# Patient Record
Sex: Female | Born: 2000 | Race: White | Hispanic: No | Marital: Single | Smoking: Never smoker
Health system: Southern US, Community
[De-identification: ages and names within clinical notes are randomized; demographics above are authoritative.]

## PROBLEM LIST (undated history)

## (undated) DIAGNOSIS — T7840XA Allergy, unspecified, initial encounter: Secondary | ICD-10-CM

## (undated) DIAGNOSIS — R519 Headache, unspecified: Secondary | ICD-10-CM

## (undated) DIAGNOSIS — J45909 Unspecified asthma, uncomplicated: Secondary | ICD-10-CM

## (undated) DIAGNOSIS — F419 Anxiety disorder, unspecified: Secondary | ICD-10-CM

## (undated) HISTORY — DX: Unspecified asthma, uncomplicated: J45.909

## (undated) HISTORY — PX: MOUTH SURGERY: SHX715

## (undated) HISTORY — DX: Allergy, unspecified, initial encounter: T78.40XA

## (undated) HISTORY — DX: Anxiety disorder, unspecified: F41.9

---
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
## 2019-03-07 ENCOUNTER — Telehealth: Payer: 59 | Admitting: Physician Assistant

## 2019-03-07 DIAGNOSIS — J019 Acute sinusitis, unspecified: Secondary | ICD-10-CM | POA: Diagnosis not present

## 2019-03-07 MED ORDER — DOXYCYCLINE HYCLATE 100 MG PO TABS: 100.0000 mg | ORAL_TABLET | Freq: Two times a day (BID) | ORAL | 0 refills | Status: DC

## 2019-03-07 NOTE — Progress Notes (Signed)
We are sorry that you are not feeling well.  Here is how we plan to help!  Based on what you have shared with me it looks like you have sinusitis.  Sinusitis is inflammation and infection in the sinus cavities of the head.  Based on your presentation I believe you most likely have Acute Bacterial Sinusitis.  This is an infection caused by bacteria and is treated with antibiotics. I have prescribed Doxycycline 100mg by mouth twice a day for 10 days. You may use an oral decongestant such as Mucinex D or if you have glaucoma or high blood pressure use plain Mucinex. Saline nasal spray help and can safely be used as often as needed for congestion.  If you develop worsening sinus pain, fever or notice severe headache and vision changes, or if symptoms are not better after completion of antibiotic, please schedule an appointment with a health care provider.    Sinus infections are not as easily transmitted as other respiratory infection, however we still recommend that you avoid close contact with loved ones, especially the very [place] and elderly.  Remember to wash your hands thoroughly throughout the day as this is the number one way to prevent the spread of infection!  Home Care:  Only take medications as instructed by your medical team.  Complete the entire course of an antibiotic.  Do not take these medications with alcohol.  A steam or ultrasonic humidifier can help congestion.  You can place a towel over your head and breathe in the steam from hot water coming from a faucet.  Avoid close contacts especially the very [place] and the elderly.  Cover your mouth when you cough or sneeze.  Always remember to wash your hands.  Get Help Right Away If:  You develop worsening fever or sinus pain.  You develop a severe head ache or visual changes.  Your symptoms persist after you have completed your treatment plan.  Make sure you  Understand these instructions.  Will watch your  condition.  Will get help right away if you are not doing well or get worse.  Your e-visit answers were reviewed by a board certified advanced clinical practitioner to complete your personal care plan.  Depending on the condition, your plan could have included both over the counter or prescription medications.  If there is a problem please reply  once you have received a response from your provider.  Your safety is important to us.  If you have drug allergies check your prescription carefully.    You can use MyChart to ask questions about today's visit, request a non-urgent call back, or ask for a work or school excuse for 24 hours related to this e-Visit. If it has been greater than 24 hours you will need to follow up with your provider, or enter a new e-Visit to address those concerns.  You will get an e-mail in the next two days asking about your experience.  I hope that your e-visit has been valuable and will speed your recovery. Thank you for using e-visits.  Greater than 5 minutes, yet less than 10 minutes of time have been spent researching, coordinating, and implementing care for this patient today  

## 2019-03-09 ENCOUNTER — Telehealth: Payer: 59 | Admitting: Family

## 2019-03-09 ENCOUNTER — Encounter: Payer: Self-pay | Admitting: Family Medicine

## 2019-03-09 DIAGNOSIS — J329 Chronic sinusitis, unspecified: Secondary | ICD-10-CM

## 2019-03-09 DIAGNOSIS — H9202 Otalgia, left ear: Secondary | ICD-10-CM

## 2019-03-09 NOTE — Progress Notes (Signed)
Based on what you shared with me, I feel your condition warrants further evaluation and I recommend that you be seen for a face to face office visit.   Given that your symptoms have not improved and have worsen with antibiotics you need to be seen face to face to be evaluated.   NOTE: If you entered your credit card information for this eVisit, you will not be charged. You may see a "hold" on your card for the $35 but that hold will drop off and you will not have a charge processed.  If you are having a true medical emergency please call 911.     For an urgent face to face visit, [facility] has four urgent care centers for your convenience:   . [facility]    [phone]                  Get Driving Directions  [id number] [place], [place] [address] . 10 am to 8 pm Monday-Friday . 12 pm to 8 pm Saturday-Sunday   . [facility] [place]                  Get Driving Directions  [id number] [place], [place], [place] [address] . 8 am to 8 pm Monday-Friday . 9 am to 6 pm Saturday . 11 am to 6 pm Sunday   . [facility] at [place]                  Get Driving Directions   [address] [place], [place] [address] . 8 am to 8 pm Monday-Friday . 8 am to 4 pm Saturday-Sunday    . [facility] at [place]                    Get Driving Directions  [id number]  [address] [place],  [address]  . Monday-Friday, 12 PM to 6 PM    Your e-visit answers were reviewed by a board certified advanced clinical practitioner to complete your personal care plan.  Thank you for using e-Visits.

## 2019-04-12 ENCOUNTER — Other Ambulatory Visit: Payer: Self-pay

## 2019-04-12 DIAGNOSIS — Z20822 Contact with and (suspected) exposure to covid-19: Secondary | ICD-10-CM

## 2019-04-13 LAB — NOVEL CORONAVIRUS, NAA: SARS-CoV-2, NAA: NOT DETECTED

## 2019-05-19 ENCOUNTER — Encounter: Payer: Self-pay | Admitting: Family Medicine

## 2019-05-21 DIAGNOSIS — D689 Coagulation defect, unspecified: Secondary | ICD-10-CM

## 2019-05-21 DIAGNOSIS — I2699 Other pulmonary embolism without acute cor pulmonale: Secondary | ICD-10-CM

## 2019-05-21 HISTORY — DX: Other pulmonary embolism without acute cor pulmonale: I26.99

## 2019-05-21 HISTORY — DX: Coagulation defect, unspecified: D68.9

## 2019-05-24 ENCOUNTER — Other Ambulatory Visit: Payer: Self-pay

## 2019-05-24 ENCOUNTER — Emergency Department: Payer: Self-pay

## 2019-05-24 ENCOUNTER — Encounter (HOSPITAL_COMMUNITY): Payer: 59

## 2019-05-24 ENCOUNTER — Emergency Department (HOSPITAL_COMMUNITY)
Admission: EM | Admit: 2019-05-24 | Discharge: 2019-05-24 | DRG: 177 | Disposition: A | Discharge status: Home / Self Care | Payer: 59 | Attending: Internal Medicine | Admitting: Internal Medicine

## 2019-05-24 DIAGNOSIS — Z8249 Family history of ischemic heart disease and other diseases of the circulatory system: Secondary | ICD-10-CM

## 2019-05-24 DIAGNOSIS — U071 COVID-19: Secondary | ICD-10-CM | POA: Diagnosis present

## 2019-05-24 DIAGNOSIS — Z79899 Other long term (current) drug therapy: Secondary | ICD-10-CM | POA: Diagnosis not present

## 2019-05-24 DIAGNOSIS — I2694 Multiple subsegmental pulmonary emboli without acute cor pulmonale: Secondary | ICD-10-CM | POA: Diagnosis present

## 2019-05-24 DIAGNOSIS — I2699 Other pulmonary embolism without acute cor pulmonale: Secondary | ICD-10-CM | POA: Diagnosis not present

## 2019-05-24 DIAGNOSIS — F419 Anxiety disorder, unspecified: Secondary | ICD-10-CM | POA: Diagnosis present

## 2019-05-24 DIAGNOSIS — D696 Thrombocytopenia, unspecified: Secondary | ICD-10-CM | POA: Diagnosis present

## 2019-05-24 DIAGNOSIS — R079 Chest pain, unspecified: Secondary | ICD-10-CM

## 2019-05-24 DIAGNOSIS — J45909 Unspecified asthma, uncomplicated: Secondary | ICD-10-CM | POA: Diagnosis present

## 2019-05-24 LAB — BASIC METABOLIC PANEL
Anion gap: 10 (ref 5–15)
BUN: 7 mg/dL (ref 6–20)
CO2: 23 mmol/L (ref 22–32)
Calcium: 9.5 mg/dL (ref 8.9–10.3)
Chloride: 108 mmol/L (ref 98–111)
Creatinine, Ser: 0.62 mg/dL (ref 0.44–1.00)
GFR calc Af Amer: >60 mL/min (ref >60)
GFR calc non Af Amer: >60 mL/min (ref >60)
Glucose, Bld: 100 mg/dL — ABNORMAL HIGH (ref 70–99)
Potassium: 3.7 mmol/L (ref 3.5–5.1)
Sodium: 141 mmol/L (ref 135–145)

## 2019-05-24 LAB — CBC WITH DIFFERENTIAL/PLATELET
Abs Immature Granulocytes: 0 10*3/uL (ref 0.00–0.07)
Basophils Absolute: 0 10*3/uL (ref 0.0–0.1)
Basophils Relative: 0 %
Eosinophils Absolute: 0 10*3/uL (ref 0.0–0.5)
Eosinophils Relative: 0 %
HCT: 42.7 % (ref 36.0–46.0)
Hemoglobin: 14.2 g/dL (ref 12.0–15.0)
Immature Granulocytes: 0 %
Lymphocytes Relative: 54 %
Lymphs Abs: 1.2 10*3/uL (ref 0.7–4.0)
MCH: 29 pg (ref 26.0–34.0)
MCHC: 33.3 g/dL (ref 30.0–36.0)
MCV: 87.1 fL (ref 80.0–100.0)
Monocytes Absolute: 0.1 10*3/uL (ref 0.1–1.0)
Monocytes Relative: 2 %
Neutro Abs: 1 10*3/uL — ABNORMAL LOW (ref 1.7–7.7)
Neutrophils Relative %: 44 %
Platelets: 20 10*3/uL — CL (ref 150–400)
RBC: 4.9 MIL/uL (ref 3.87–5.11)
RDW: 12.1 % (ref 11.5–15.5)
WBC: 2.2 10*3/uL — ABNORMAL LOW (ref 4.0–10.5)
nRBC: 0 % (ref 0.0–0.2)

## 2019-05-24 LAB — I-STAT BETA HCG BLOOD, ED (MC, WL, AP ONLY): I-stat hCG, quantitative: <5 m[IU]/mL (ref <5)

## 2019-05-24 LAB — DIC (DISSEMINATED INTRAVASCULAR COAGULATION)PANEL
D-Dimer, Quant: 0.82 ug/mL-FEU — ABNORMAL HIGH (ref 0.00–0.50)
Fibrinogen: 412 mg/dL (ref 210–475)
INR: 1 (ref 0.8–1.2)
Platelets: 245 10*3/uL (ref 150–400)
Prothrombin Time: 12.8 seconds (ref 11.4–15.2)
Smear Review: NONE SEEN
aPTT: 22 seconds — ABNORMAL LOW (ref 24–36)

## 2019-05-24 LAB — HEPATIC FUNCTION PANEL
ALT: 21 U/L (ref 0–44)
AST: 20 U/L (ref 15–41)
Albumin: 3.9 g/dL (ref 3.5–5.0)
Alkaline Phosphatase: 57 U/L (ref 38–126)
Bilirubin, Direct: <0.1 mg/dL (ref 0.0–0.2)
Total Bilirubin: 0.6 mg/dL (ref 0.3–1.2)
Total Protein: 6.7 g/dL (ref 6.5–8.1)

## 2019-05-24 LAB — VITAMIN B12: Vitamin B-12: 239 pg/mL (ref 180–914)

## 2019-05-24 LAB — PLATELET COUNT: Platelets: 245 10*3/uL (ref 150–400)

## 2019-05-24 LAB — POC SARS CORONAVIRUS 2 AG -  ED: SARS Coronavirus 2 Ag: POSITIVE — AB

## 2019-05-24 LAB — IMMATURE PLATELET FRACTION: Immature Platelet Fraction: 2.5 % (ref 1.2–8.6)

## 2019-05-24 MED ORDER — VENLAFAXINE HCL ER 150 MG PO CP24
150.0000 mg | ORAL_CAPSULE | Freq: Every day | ORAL | Status: DC
  Filled 2019-05-24: qty 1

## 2019-05-24 MED ORDER — RIVAROXABAN (XARELTO) EDUCATION KIT FOR DVT/PE PATIENTS
PACK | Freq: Once | Status: AC
  Administered 2019-05-24: 22:00:00
  Filled 2019-05-24: qty 1

## 2019-05-24 MED ORDER — SODIUM CHLORIDE 0.9% FLUSH: 3.0000 mL | INTRAVENOUS | Status: DC | PRN

## 2019-05-24 MED ORDER — BISACODYL 5 MG PO TBEC: 5.0000 mg | DELAYED_RELEASE_TABLET | Freq: Every day | ORAL | Status: DC | PRN

## 2019-05-24 MED ORDER — ALBUTEROL SULFATE HFA 108 (90 BASE) MCG/ACT IN AERS: 1.0000 | INHALATION_SPRAY | Freq: Four times a day (QID) | RESPIRATORY_TRACT | Status: DC | PRN

## 2019-05-24 MED ORDER — RIVAROXABAN 15 MG PO TABS
15.0000 mg | ORAL_TABLET | Freq: Two times a day (BID) | ORAL | Status: DC
  Administered 2019-05-24: 15 mg via ORAL
  Filled 2019-05-24 (×2): qty 1

## 2019-05-24 MED ORDER — HEPARIN BOLUS VIA INFUSION
4200.0000 [IU] | Freq: Once | INTRAVENOUS | Status: DC
  Filled 2019-05-24: qty 4200

## 2019-05-24 MED ORDER — HEPARIN (PORCINE) 25000 UT/250ML-% IV SOLN
1350.0000 [IU]/h | INTRAVENOUS | Status: DC
  Filled 2019-05-24: qty 250

## 2019-05-24 MED ORDER — SODIUM CHLORIDE 0.9 % IV SOLN: 250.0000 mL | INTRAVENOUS | Status: DC | PRN

## 2019-05-24 MED ORDER — ONDANSETRON HCL 4 MG PO TABS: 4.0000 mg | ORAL_TABLET | Freq: Four times a day (QID) | ORAL | Status: DC | PRN

## 2019-05-24 MED ORDER — LORATADINE 10 MG PO TABS: 10.0000 mg | ORAL_TABLET | Freq: Every day | ORAL | Status: DC

## 2019-05-24 MED ORDER — ZOLPIDEM TARTRATE 5 MG PO TABS: 5.0000 mg | ORAL_TABLET | Freq: Every evening | ORAL | Status: DC | PRN

## 2019-05-24 MED ORDER — OXYCODONE HCL 5 MG PO TABS: 5.0000 mg | ORAL_TABLET | ORAL | Status: DC | PRN

## 2019-05-24 MED ORDER — RIVAROXABAN (XARELTO) VTE STARTER PACK (15 & 20 MG): ORAL_TABLET | ORAL | 0 refills | Status: DC

## 2019-05-24 MED ORDER — POLYETHYLENE GLYCOL 3350 17 G PO PACK: 17.0000 g | PACK | Freq: Every day | ORAL | Status: DC | PRN

## 2019-05-24 MED ORDER — DOCUSATE SODIUM 100 MG PO CAPS: 100.0000 mg | ORAL_CAPSULE | Freq: Two times a day (BID) | ORAL | Status: DC

## 2019-05-24 MED ORDER — DEXAMETHASONE 4 MG PO TABS
6.0000 mg | ORAL_TABLET | Freq: Once | ORAL | Status: AC
  Administered 2019-05-24: 6 mg via ORAL
  Filled 2019-05-24: qty 2

## 2019-05-24 MED ORDER — ONDANSETRON HCL 4 MG/2ML IJ SOLN: 4.0000 mg | Freq: Four times a day (QID) | INTRAMUSCULAR | Status: DC | PRN

## 2019-05-24 MED ORDER — DEXAMETHASONE 6 MG PO TABS: 6.0000 mg | ORAL_TABLET | Freq: Every day | ORAL | 0 refills | Status: DC

## 2019-05-24 MED ORDER — LEVONORGESTREL-ETHINYL ESTRAD 0.15-30 MG-MCG PO TABS: 1.0000 | ORAL_TABLET | Freq: Every day | ORAL | Status: DC

## 2019-05-24 MED ORDER — ACETAMINOPHEN 325 MG PO TABS: 650.0000 mg | ORAL_TABLET | Freq: Four times a day (QID) | ORAL | Status: DC | PRN

## 2019-05-24 MED ORDER — SODIUM CHLORIDE 0.9% FLUSH: 3.0000 mL | Freq: Two times a day (BID) | INTRAVENOUS | Status: DC

## 2019-05-24 NOTE — Progress Notes (Signed)
ANTICOAGULATION CONSULT NOTE - Initial Consult  Pharmacy Consult for heparin Indication: pulmonary embolus  No Known Allergies  Patient Measurements: Height: 5\' 7"  (170.2 cm) Weight: 215 lb (97.5 kg) IBW/kg (Calculated) : 61.6 Heparin Dosing Weight: 83.2 kg  Vital Signs: Temp: 98.5 F (36.9 C) (12/04 1238) Temp Source: Oral (12/04 1238) BP: 154/106 (12/04 1238) Pulse Rate: 125 (12/04 1238)  Labs: No results for input(s): HGB, HCT, PLT, APTT, LABPROT, INR, HEPARINUNFRC, HEPRLOWMOCWT, CREATININE, CKTOTAL, CKMB, TROPONINIHS in the last 72 hours.  CrCl cannot be calculated (Patient's most recent lab result is older than the maximum 21 days allowed.).   Medical History: Past Medical History:  Diagnosis Date  . Anxiety   . Asthma     Assessment: 52 yof with hx of asthma now presenting with SOB. Outpatient CT scan showing scattered PE without heart strain, bilateral infiltrates.   No s/sx of bleeding. Plan for heparin start once pregnancy test is negative.   Goal of Therapy:  Heparin level 0.3-0.7 units/ml Monitor platelets by anticoagulation protocol: Yes   Plan:  Give 4200 units bolus x 1 Start heparin infusion at 1350 units/hr Check anti-Xa level in 6 hours and daily while on heparin Continue to monitor H&H and platelets  Antonietta Jewel, PharmD, BCCCP Clinical Pharmacist  Phone: [phone]  Please check AMION for all [facility] phone numbers After 10:00 PM, call [facility] [phone] 05/24/2019,1:16 PM

## 2019-05-24 NOTE — ED Provider Notes (Signed)
[facility] EMERGENCY DEPARTMENT Provider Note   CSN: [id number] Arrival date & time: 05/24/19  1233     History   Chief Complaint Chief Complaint  Patient presents with  . Shortness of Breath    HPI Kayla Hayes is a 18 y.o. female.     The history is provided by the patient.  Shortness of Breath Severity:  Moderate Onset quality:  Gradual Timing:  Constant Progression:  Unchanged Chronicity:  New Context: activity   Relieved by:  Rest Worsened by:  Exertion and deep breathing Associated symptoms: chest pain   Associated symptoms: no abdominal pain, no cough, no ear pain, no fever, no rash, no sore throat, no vomiting and no wheezing   Risk factors: oral contraceptive use   Risk factors: no hx of PE/DVT, no prolonged immobilization and no recent surgery   Risk factors comment:  Hx of asthma   Past Medical History:  Diagnosis Date  . Anxiety   . Asthma     Patient Active Problem List   Diagnosis Date Noted  . Bilateral pulmonary embolism ([place]) 05/24/2019  . Anxiety 11/29/2018  . Unspecified asthma(493.90) 08/08/2012    No past surgical history on file.   OB History   No obstetric history on file.      Home Medications    Prior to Admission medications   Medication Sig Start Date End Date Taking? Authorizing Provider  albuterol (VENTOLIN HFA) 108 (90 Base) MCG/ACT inhaler Inhale 1 puff into the lungs every 6 (six) hours as needed for wheezing or shortness of breath.  03/28/14   [provider]  azithromycin (ZITHROMAX) 250 MG tablet Take 2 pills today. Then one pill days 2-5 09/03/18   Lacy Duverney M, PA-C  benzonatate (TESSALON) 100 MG capsule Take 1 capsule (100 mg total) by mouth 3 (three) times daily as needed for cough. 09/03/18   Waldon Merl, PA-C  cetirizine (ZYRTEC) 10 MG tablet Take 1 tablet (10 mg total) by mouth daily. Use as needed for allergies 10/29/18   Copland, Gay Filler, MD  doxycycline (VIBRA-TABS) 100 MG  tablet Take 1 tablet (100 mg total) by mouth 2 (two) times daily. 03/07/19   Hedges, Dellis Filbert, PA-C  fluticasone (FLONASE) 50 MCG/ACT nasal spray Place 2 sprays into both nostrils daily. 09/03/18   Waldon Merl, PA-C  hydrOXYzine (ATARAX/VISTARIL) 10 MG tablet Take 1 tablet (10 mg total) by mouth 3 (three) times daily as needed. Use for anxiety 05/24/17   Copland, Gay Filler, MD  LILLOW 0.15-30 MG-MCG tablet Take 1 tablet by mouth daily.  04/29/17   [provider]  meloxicam (MOBIC) 15 MG tablet Take 15 mg by mouth daily as needed. 05/15/19   [provider]  venlafaxine XR (EFFEXOR-XR) 75 MG 24 hr capsule Take 2 capsules (150 mg total) by mouth daily. 08/23/18   Copland, Gay Filler, MD    Family History Family History  Problem Relation Age of Onset  . Heart disease Paternal Grandfather     Social History Social History   Tobacco Use  . Smoking status: Never Smoker  . Smokeless tobacco: Never Used  Substance Use Topics  . Alcohol use: No  . Drug use: No     Allergies   Patient has no known allergies.   Review of Systems Review of Systems  Constitutional: Negative for chills and fever.  HENT: Negative for ear pain and sore throat.   Eyes: Negative for pain and visual disturbance.  Respiratory:  Positive for shortness of breath. Negative for cough and wheezing.   Cardiovascular: Positive for chest pain. Negative for palpitations.  Gastrointestinal: Negative for abdominal pain and vomiting.  Genitourinary: Negative for dysuria and hematuria.  Musculoskeletal: Negative for arthralgias and back pain.  Skin: Negative for color change and rash.  Neurological: Negative for seizures and syncope.  All other systems reviewed and are negative.    Physical Exam Updated Vital Signs  ED Triage Vitals [05/24/19 1237]  Enc Vitals Group     BP (!) 154/106     Pulse Rate (!) 120     Resp 16     Temp 98.5 F (36.9 C)     Temp Source Oral     SpO2 100 %     Weight 215  lb (97.5 kg)     Height 5\' 7"  (1.702 m)     Head Circumference      Peak Flow      Pain Score 0     Pain Loc      Pain Edu?      Excl. in [place]?     Physical Exam Vitals signs and nursing note reviewed.  Constitutional:      General: She is not in acute distress.    Appearance: She is well-developed. She is not ill-appearing.  HENT:     Head: Normocephalic and atraumatic.  Eyes:     Conjunctiva/sclera: Conjunctivae normal.  Neck:     Musculoskeletal: Neck supple.  Cardiovascular:     Rate and Rhythm: Regular rhythm. Tachycardia present.     Pulses: Normal pulses.     Heart sounds: Normal heart sounds. No murmur.  Pulmonary:     Effort: Pulmonary effort is normal. No respiratory distress.     Breath sounds: Normal breath sounds. No decreased breath sounds, wheezing, rhonchi or rales.  Abdominal:     Palpations: Abdomen is soft.     Tenderness: There is no abdominal tenderness.  Musculoskeletal: Normal range of motion.     Right lower leg: No edema.     Left lower leg: No edema.  Skin:    General: Skin is warm and dry.     Capillary Refill: Capillary refill takes less than 2 seconds.  Neurological:     Mental Status: She is alert.  Psychiatric:        Mood and Affect: Mood normal.      ED Treatments / Results  Labs (all labs ordered are listed, but only abnormal results are displayed) Labs Reviewed  CBC WITH DIFFERENTIAL/PLATELET - Abnormal; Notable for the following components:      Result Value   WBC 2.2 (*)    Platelets 20 (*)    Neutro Abs 1.0 (*)    All other components within normal limits  BASIC METABOLIC PANEL - Abnormal; Notable for the following components:   Glucose, Bld 100 (*)    All other components within normal limits  POC SARS CORONAVIRUS 2 AG -  ED - Abnormal; Notable for the following components:   SARS Coronavirus 2 Ag POSITIVE (*)    All other components within normal limits  HEPATIC FUNCTION PANEL  HEPARIN LEVEL (UNFRACTIONATED)   IMMATURE PLATELET FRACTION  PLATELET COUNT  DIC (DISSEMINATED INTRAVASCULAR COAGULATION) PANEL  FIBRINOGEN  APTT  PROTIME-INR  D-DIMER, QUANTITATIVE (NOT AT [facility])  I-STAT BETA HCG BLOOD, ED ([facility], [facility], [facility] ONLY)    EKG EKG Interpretation  Date/Time:  Friday May 24 2019 14:12:20 EST Ventricular Rate:  89  PR Interval:    QRS Duration: 97 QT Interval:  336 QTC Calculation: 409 R Axis:   74 Text Interpretation: Sinus rhythm Confirmed by Lennice Sites [phone]) on 05/24/2019 2:16:08 PM   Radiology Ct Outside Films Chest  Result Date: 05/24/2019 This examination belongs to an outside facility and is stored here for comparison purposes only.  Contact the originating outside institution for any associated report or interpretation.   Procedures .Critical Care Performed by: Lennice Sites, DO Authorized by: Lennice Sites, DO   Critical care provider statement:    Critical care time (minutes):  35   Critical care was necessary to treat or prevent imminent or life-threatening deterioration of the following conditions:  Respiratory failure   Critical care was time spent personally by me on the following activities:  Blood draw for specimens, development of treatment plan with patient or surrogate, discussions with primary provider, evaluation of patient's response to treatment, examination of patient, obtaining history from patient or surrogate, ordering and performing treatments and interventions, ordering and review of laboratory studies, ordering and review of radiographic studies, pulse oximetry, re-evaluation of patient's condition and review of old charts   (including critical care time)  Medications Ordered in ED Medications  heparin bolus via infusion 4,200 Units (has no administration in time range)  heparin ADULT infusion 100 units/mL (25000 units/263mL sodium chloride 0.45%) (has no administration in time range)    EMERGENCY DEPARTMENT  US GUIDANCE EXAM Emergency  Ultrasound:  US Guidance for Needle Guidance  INDICATIONS: Difficult vascular access Linear probe used in real-time to visualize location of needle entry through skin.   PERFORMED BY: Myself IMAGES ARCHIVED?: Yes LIMITATIONS: Pain VIEWS USED: Transverse INTERPRETATION: Left arm  Initial Impression / Assessment and Plan / ED Course  I have reviewed the triage vital signs and the nursing notes.  Pertinent labs & imaging results that were available during my care of the patient were reviewed by me and considered in my medical decision making (see chart for details).     Kayla Hayes is a 18 year old female history of asthma who presents to the ED with shortness of breath, outpatient CT scan that showed scattered pulmonary emboli, bilateral infiltrates.  Patient tachycardic in the 120s upon arrival but otherwise normal vitals.  Normal blood pressure.  Patient is on estrogen birth control.  Has had chest pain and shortness of breath with exertion over the last several days.  Worse when she takes a deep breath in.  Has had a negative coronavirus test in the past.  Has never been diagnosed with Covid.  Went to an urgent care several days ago and had an EKG, blood work done.  Had a positive D-dimer and was scheduled for an outpatient CT scan today in the [facility] system.  On chart review it appears that patient has scattered emboli with no right heart strain.  There is bilateral infiltrates as well that I suspect are likely infarctions.  Does not have any fever, infectious symptoms.  Will check basic labs, Covid test.    Covid test is positive.  Patient with a white count of 2.2.  Patient with platelet count of 20.  Therefore IV heparin/anticoagulation has been held.  Talked with Dr. Irene Limbo with hematology and he also recommends holding IV heparin at this time until more lab work can be done to figure out if this is viral suppression versus ITP versus DIC.  Patient overall hemodynamically stable.  Overall  small clot burden.  At this time, more  harm likely to be done if given heparin without further work-up.  Hospitalist to admit the patient and follow-up hematology recommendations.  Hemodynamically stable throughout my care.  This chart was dictated using voice recognition software.  Despite best efforts to proofread,  errors can occur which can change the documentation meaning.   Final Clinical Impressions(s) / ED Diagnoses   Final diagnoses:  Multiple subsegmental pulmonary emboli without acute cor pulmonale ([place])  Thrombocytopenia [facility])    ED Discharge Orders    None       Lennice Sites, DO 05/24/19 1655

## 2019-05-24 NOTE — ED Provider Notes (Signed)
Contacted by Dr. Lorin Mercy of the hospitalist service.  Patient with a history of segmental pulmonary emboli and coronavirus positive was being admitted.  Her initial blood count showed some thrombocytopenia around 20 but the repeat is normal at 245.  Dr. Lorin Mercy has seen patient discussed with hematology and critical care.  All parties feel patient can go home with Xarelto and steroids.  Patient is agreeable to this plan. denies any chest pain or shortness of breath.  Patient has been seen by pharmacy as well as case manager with arrangements for first month of Xarelto arranged. She also needs steroids given her coronavirus positive test.  D/w Dr. Bedelia Person of [facility] who agrees with 6 mg of Decadron x10 days.  Patient on recheck she is feeling well and denies chest pain or shortness of breath.  She is not hypoxic.  Mild tachycardia around 100.  No chest pain or shortness of breath.  She wishes to go home. She has a PCP to follow-up with.  Discussed that she needs to see his PCP next week to continue her anticoagulation.  Return to the ED with difficulty breathing, chest pain, or other concerns.  BP 118/66   Pulse 98   Temp 98.5 F (36.9 C) (Oral)   Resp (!) 23   Ht 5\' 7"  (1.702 m)   Wt 97.5 kg   LMP 05/14/2019   SpO2 99%   BMI 33.67 kg/m     Ezequiel Essex, MD 05/25/19 [phone]

## 2019-05-24 NOTE — Progress Notes (Addendum)
Addendum:  Repeat platelets are 245.  Clearly, this changes the entire clinical scenario.  This was pseudothrombocytopenia.  She does not require hospitalization at this time.  She can be discharged on Xarelto and steroids.  She will need to be discharged on COVID precautions.  Will notify EDP that patient is appropriate for outpatient management.  Patient discussed with PCCM; hematology; and EDP and are in agreement.  Patient and mother are aware of change in plan.  Needs f/u CBC next week.  Admission charge canceled and patient billed as ER Consult, level 5 for the inordinate amount of time the coordination of care required.   Carlyon Shadow, M.D.

## 2019-05-24 NOTE — Progress Notes (Signed)
Hematology short note  Patient's case was discussed with ED physician and Dr. Boris Lown medicine. Patient has not been personally examined or interviewed-caveats.  In general terms patient with newly diagnosed subsegmental low burden PE with pleuritic chest pain but no hypoxia or acute shortness of breath.  Also noted to have pulmonary infiltrates and has been found to be newly Covid 19 positive.  Labs show CBC with leukopenia WBC count 2.2k with ANC of 1k, platelets of 20k and normal hemoglobin. No reported previous history of thrombocytopenia or abnormal blood counts.  Ultrasound of the lower extremities has not been done yet but no symptoms per Dr. Lorin Mercy.  We discussed that with platelets of 20k therapeutic anticoagulation would have a increased risk of bleeding and potential alveolar hemorrhage with her pulmonary infiltrates.  Her [id number] could certainly be a triggering event for pulmonary embolism due to resulting hypercoagulable state from the elevation of multiple clotting factors, endothelial injury and prothrombotic circulating particles.  -Would be reasonable to get some initial work-up including checking platelet count in citrate to rule out pseudothrombocytopenia - immature platelet fraction -which is high could suggest peripheral platelet destruction and be suggestive of infection related immune thrombocytopenia and could potentially benefit from IVIG to improve platelet counts. -DIC panel, fibrinogen level, APTT, PT. -Ultrasound lower extremities to evaluate for DVT and determine clot burden -If patient's COVID-19 infection risk profiling warrants steroids this could potentially help her platelet counts as well if immune mediated. -Once platelet counts are more than 30k could consider starting prophylactic subcu heparin. -Once platelet counts more than 50k could consider starting IV heparin without bolus and keeping levels in the low therapeutic range. -Case discussed with  Dr. Karmen Bongo.  Sullivan Lone MD MS

## 2019-05-24 NOTE — H&P (Signed)
History and Physical    FLORIS Hayes [id number] DOB: 11/14/2000 DOA: 05/24/2019  PCP: Darreld Mclean, MD Consultants:  None Patient coming from: Home; NOK: Mother, Kayla Hayes, [phone]  Chief Complaint: SOB  HPI: Kayla Hayes is a 18 y.o. female with medical history significant of asthma and anxiety presenting with SOB.  She reports that she developed pleuritic chest pain in the left lateral chest wall starting about last Wednesday (9 days ago).  She went to urgent care and had a negative COVID test.  She returned on Friday (1 week ago) and had blood work (she reports having another negative COVID test at that time with blood work).  Her blood work was abnormal and she was arranged to have an outpatient CT today (elevated D-dimer) which showed B scattered pulmonary emboli and so she was sent to the ER.  She reports that her only symptom in persistent left lateral chest wall pain, worse with deep inhalation.  Review of Care Everywhere shows a visit to [facility] on 11/25.  She had atypical chest pain and had EKG; CXR; D-dimer; CBC; CMP; and COVID test ordered.  She was given Mobic 15 mg for chest wall pain.  Lab results are not available other than negative COVID from 11/25.  CTA from today shows scattered pulmonary emboli with an overall small embolic burden; and scattered B lung infiltrates.  ED Course:  Scattered pulmonary emboli and B inlfiltrates found on CT (ordered by urgent care after + D-dimer 4 days ago). CP/SOB for a week, negative COVID test 2 weeks ago.  + COVID test today.  No hypoxia, no severe respiratory symptoms.  Also on OCPs.  Platelets 20 - no heparin.  Hematology consult pending, likely viral suppression vs. ITP.  For now, no anticoagulation.  Could transfuse platelets but will defer to heme.  Will admit to [facility].  Review of Systems: As per HPI; otherwise review of systems reviewed and negative.   Ambulatory Status:  Ambulates without  assistance  Past Medical History:  Diagnosis Date   Anxiety    Asthma     No past surgical history on file.  Social History   Socioeconomic History   Marital status: Single    Spouse name: Not on file   Number of children: Not on file   Years of education: Not on file   Highest education level: Not on file  Occupational History   Not on file  Social Needs   Financial resource strain: Not on file   Food insecurity    Worry: Not on file    Inability: Not on file   Transportation needs    Medical: Not on file    Non-medical: Not on file  Tobacco Use   Smoking status: Never Smoker   Smokeless tobacco: Never Used  Substance and Sexual Activity   Alcohol use: No   Drug use: No   Sexual activity: Not on file  Lifestyle   Physical activity    Days per week: Not on file    Minutes per session: Not on file   Stress: Not on file  Relationships   Social connections    Talks on phone: Not on file    Gets together: Not on file    Attends religious service: Not on file    Active member of club or organization: Not on file    Attends meetings of clubs or organizations: Not on file    Relationship status: Not on file  Intimate partner violence    Fear of current or ex partner: Not on file    Emotionally abused: Not on file    Physically abused: Not on file    Forced sexual activity: Not on file  Other Topics Concern   Not on file  Social History Narrative   Not on file    No Known Allergies  Family History  Problem Relation Age of Onset   Heart disease Paternal Grandfather     Prior to Admission medications   Medication Sig Start Date End Date Taking? Authorizing Provider  albuterol (VENTOLIN HFA) 108 (90 Base) MCG/ACT inhaler  03/28/14   [provider]  azithromycin (ZITHROMAX) 250 MG tablet Take 2 pills today. Then one pill days 2-5 09/03/18   Lacy Duverney M, PA-C  benzonatate (TESSALON) 100 MG capsule Take 1 capsule (100 mg total)  by mouth 3 (three) times daily as needed for cough. 09/03/18   Waldon Merl, PA-C  cetirizine (ZYRTEC) 10 MG tablet Take 1 tablet (10 mg total) by mouth daily. Use as needed for allergies 10/29/18   Copland, Gay Filler, MD  doxycycline (VIBRA-TABS) 100 MG tablet Take 1 tablet (100 mg total) by mouth 2 (two) times daily. 03/07/19   Hedges, Dellis Filbert, PA-C  fluticasone (FLONASE) 50 MCG/ACT nasal spray Place 2 sprays into both nostrils daily. 09/03/18   Waldon Merl, PA-C  hydrOXYzine (ATARAX/VISTARIL) 10 MG tablet Take 1 tablet (10 mg total) by mouth 3 (three) times daily as needed. Use for anxiety 05/24/17   Copland, Gay Filler, MD  LILLOW 0.15-30 MG-MCG tablet TAKE 1 TABLET BY MOUTH ONCE DAILY. START ON THE SUNDAY AFTER YOUR NEXT MENSES 04/29/17   [provider]  venlafaxine XR (EFFEXOR-XR) 75 MG 24 hr capsule Take 2 capsules (150 mg total) by mouth daily. 08/23/18   Copland, Gay Filler, MD    Physical Exam: Vitals:   05/24/19 1515 05/24/19 1530 05/24/19 1545 05/24/19 1600  BP: 130/74 133/83 134/73 129/83  Pulse:      Resp: 20 16 19  (!) 9  Temp:      TempSrc:      SpO2:      Weight:      Height:          General:  Appears calm and comfortable and is NAD  Eyes:  PERRL, EOMI, normal lids, iris  ENT:  grossly normal hearing, lips & tongue, mmm; appropriate dentition  Neck:  no LAD, masses or thyromegaly; no carotid bruits  Cardiovascular:  RRR, no m/r/g. No LE edema.   Respiratory:   CTA bilaterally with no wheezes/rales/rhonchi.  Normal respiratory effort.  Abdomen:  soft, NT, ND, NABS  Back:   normal alignment, no CVAT  Skin:  no rash or induration seen on limited exam  Musculoskeletal:  grossly normal tone BUE/BLE, good ROM, no bony abnormality  Lower extremity:  No LE edema. Negative squeeze or Homan's.   Psychiatric:  Anxious and emotionally labile mood and affect, speech fluent and appropriate, AOx3  Neurologic:  CN 2-12 grossly intact, moves all extremities in  coordinated fashion, sensation intact    Radiological Exams on Admission: Ct Outside Films Chest  Result Date: 05/24/2019 This examination belongs to an outside facility and is stored here for comparison purposes only.  Contact the originating outside institution for any associated report or interpretation.   EKG: Independently reviewed.  NSR with rate 89; no evidence of acute ischemia   Labs on Admission: I have personally reviewed the  available labs and imaging studies at the time of the admission.  Pertinent labs:   Unremarkable CMP WBC 2.2 Platelets 20 HCG negative COVID POSITIVE   Assessment/Plan Principal Problem:   Bilateral pulmonary embolism (HCC) Active Problems:   Asthma   Anxiety   COVID-19 virus infection   Thrombocytopenia (HCC)   B PE -Patient without prior episodes of thromboembolic disease presenting with new B scattered PEs (but significant clot burden, no right heart strain) -This is most likely in the setting of COVID-19-associated hypercoagulability in conjunction with OCPs and obesity -However, it is complicated by thrombocytopenia -Patient discussed with Dr. Irene Limbo -For now, the clot burden does not appear to be extensive and she is not hypoxic on room air; this is reassuring, since the patient is not a candidate for anticoagulation at this time. -Will order LE DVT US, although there is no clinical evidence of this on exam at this time. -This cannot be managed as an outpatient since the heme/onc clinic is not allowed to see COVID + patients at this time, and this is a tenuous situation. -Will admit to [facility], remote consults from hematology. -Will hold OCPs, and these need to be carefully considered in the future (although this is a unique circumstance).  Thrombocytopenia -Most likely etiology is viral bone marrow suppression -However, ITP and DIC are considerations and Dr. Irene Limbo has ordered labs to evaluate for these  conditions -If patient has ITP, there may be a role for IVIg for acute ITP presentation with acute infection.  The IVIg would likely increase the platelets enough to allow for initiation of AC. -Assuming this is viral suppression, steroids for COVID (see below) may increase the platelets. -Another consideration would be to transfuse platelets and monitor for stability; if stable, it may be possible to start low-level AC (heparin) with close ongoing monitoring. -Platelet count also ordered to confirm no clumping.  -Generally the plan will be gingerly starting AC and monitoring over time.    COVID-19 infection -COVID POSITIVE -Overall minimal symptoms but complicated scenario as noted above. -The patient has comorbidities which may increase the risk for ARDS/MODS including:  obesity, asthma -Pertinent labs concerning for COVID include leukopenia/lymphopenia; elevated D-dimer (not >1).  Other inflammatory marker labs are pending. -Chest CT with multifocal opacities which may be c/w COVID vs. Multifocal PNA -Will treat with broad-spectrum antibiotics if procalcitonin >0.1.   --Will admit to [facility] for further evaluation, close monitoring, and treatment -Monitor on telemetry x at least 24 hours -At this time, will attempt to avoid use of aerosolized medications and use HFAs instead -Will check daily labs including BMP with Mag, Phos; LFTs; CBC with differential; CRP; ferritin; fibrinogen; D-dimer -Will order steroids (1 mg/kg divided BID) and Remdesivir (pharmacy consult) given +COVID test, +CXR -If the patient shows clinical deterioration, consider transfer to ICU with PCCM consultation -Will attempt to maintain euvolemia to a net negative fluid status -Will ask the patient to maintain an awake prone position for 16+ hours a day, if possible, with a minimum of 2-3 hours at a time -Patient was seen wearing full PPE including: gown, gloves, head cover, N95, and face shield; donning and doffing was in  compliance with current standards.  Asthma -Continue Zyrtec (formulary substitution for Claritin) and Albuterol  Anxiety -Continue Effexor   DVT prophylaxis:  SCDs Code Status:  Full - confirmed with patient Family Communication: Mother was asked to leave in accordance with current [facility] visitation policies; I then spoke with her by telephone  Disposition Plan:  Home once clinically improved Consults called: Hematology; Pulmonology by telephone Admission status: Admit - It is my clinical opinion that admission to INPATIENT is reasonable and necessary because of the expectation that this patient will require hospital care that crosses at least 2 midnights to treat this condition based on the medical complexity of the problems presented.  Given the aforementioned information, the predictability of an adverse outcome is felt to be significant.  Ok to start steroids/remdesivir if needed; steroids may help her platelets.  Unable to manage as an outpatient so needs admission to progressive giving multiple problems at this time.   Karmen Bongo MD [facility]   How to contact the [facility] Attending or Consulting provider [place] or covering provider during after hours [place], for this patient?  1. Check the care team in [facility] and look for a) attending/consulting [facility] provider listed and b) the [facility] team listed 2. Log into [email or web address] and use [place]'s universal password to access. If you do not have the password, please contact the hospital operator. 3. Locate the [facility] provider you are looking for under [facility] and page to a number that you can be directly reached. 4. If you still have difficulty reaching the provider, please page the [facility] (Director on Call) for the Hospitalists listed on amion for assistance.   05/24/2019, 5:41 PM

## 2019-05-24 NOTE — ED Triage Notes (Signed)
Pt reports was told she has a blood clot in her lung. VSS.

## 2019-05-24 NOTE — Discharge Instructions (Addendum)
Take the blood thinner as prescribed all the steroids.  Call your doctor for an appointment early next week.  Keep yourself quarantined until you are feeling better in terms of your coronavirus.  Return to the ED with chest pain, shortness of breath, persistent vomiting, fever or any other concerns.

## 2019-05-25 ENCOUNTER — Encounter: Payer: Self-pay | Admitting: Family Medicine

## 2019-05-26 ENCOUNTER — Encounter: Payer: Self-pay | Admitting: Family Medicine

## 2019-05-26 LAB — FOLATE RBC
Folate, Hemolysate: 498 ng/mL
Folate, RBC: 1191 ng/mL (ref >498)
Hematocrit: 41.8 % (ref 34.0–46.6)

## 2019-05-27 NOTE — Progress Notes (Signed)
[place] at [facility] [address], [place], [place] [address] [id number] [phone]  Date:  05/29/2019   Name:  Kayla Hayes   DOB:  03-Nov-2000   MRN:  [id number]  PCP:  Darreld Mclean, MD    Chief Complaint: No chief complaint on file.   History of Present Illness:  Kayla Hayes is a 18 y.o. very pleasant female patient who presents with the following:  Virtual visit today for hospital follow-up Patient location is home, provider is at office Patient identity confirmed with 2 factors, she gives consent for virtual visit today The patient, her mother, and myself are present on the call today  I last saw her in the office for a physical in March.  She has generally been in good health, some history of anxiety and depression She went to UC the day prior to thanksgiving with chest and shoulder pain.  She returned 2 days later, at which point they did a D dimer which was positive, leading to her CT chest . Per her report a covid test was negative at that time.    She was seen in the ER on 12/4 following her positive D-dimer and outpatient CT showing scattered pulmonary emboli and bilateral infiltrates. She had a Covid test in the ER which was positive, had neutropenia and thrombocytopenia She was not given heparin due to thrombocytopenia- however on recheck her platelets were actually normal so I suspect this was laboratory error Hospital admission was discussed, but when her platelets came back normal on recheck she was allowed to go home She is taking Xarelto, was given dexamethasone  She is on combination OCP but has no other known blood clots risk factors Never a smoker  Never had a clot in the past, no history of immobility No family history of blood clot  She is still taking steroids for 5-6 more days Her cough is more when she is talking, moving; overall she is still feeling tired, but is gradually getting better  They are not  checking any vitals except for her temperature, she has not had a fever She has a 30-day starter pack of Xarelto on hand  Patient Active Problem List   Diagnosis Date Noted  . Bilateral pulmonary embolism ([place]) 05/24/2019  . COVID-19 virus infection 05/24/2019  . Thrombocytopenia ([place]) 05/24/2019  . Anxiety 11/29/2018  . Asthma 08/08/2012    Past Medical History:  Diagnosis Date  . Anxiety   . Asthma     No past surgical history on file.  Social History   Tobacco Use  . Smoking status: Never Smoker  . Smokeless tobacco: Never Used  Substance Use Topics  . Alcohol use: No  . Drug use: No    Family History  Problem Relation Age of Onset  . Heart disease Paternal Grandfather     No Known Allergies  Medication list has been reviewed and updated.  Current Outpatient Medications on File Prior to Visit  Medication Sig Dispense Refill  . albuterol (VENTOLIN HFA) 108 (90 Base) MCG/ACT inhaler Inhale 1 puff into the lungs every 6 (six) hours as needed for wheezing or shortness of breath.     . Ascorbic Acid (VITAMIN C PO) Take 1 tablet by mouth at bedtime.    . cetirizine (ZYRTEC) 10 MG tablet Take 1 tablet (10 mg total) by mouth daily. Use as needed for allergies (Patient taking differently: Take 10 mg by mouth at bedtime.  Use as needed for allergies) 90 tablet 3  . dexamethasone (DECADRON) 6 MG tablet Take 1 tablet (6 mg total) by mouth daily. 10 tablet 0  . LILLOW 0.15-30 MG-MCG tablet Take 1 tablet by mouth at bedtime.   4  . Rivaroxaban 15 & 20 MG TBPK Follow package directions: Take one 15mg  tablet by mouth twice a day. On day 22, switch to one 20mg  tablet once a day. Take with food. 51 each 0  . venlafaxine XR (EFFEXOR-XR) 75 MG 24 hr capsule Take 2 capsules (150 mg total) by mouth daily. (Patient taking differently: Take 150 mg by mouth at bedtime. ) 180 capsule 3  . VITAMIN D PO Take 1 tablet by mouth at bedtime.     No current facility-administered medications on  file prior to visit.     Review of Systems:  As per HPI- otherwise negative.   Physical Examination: There were no vitals filed for this visit. There were no vitals filed for this visit. There is no height or weight on file to calculate BMI. Ideal Body Weight:    Pt observed via video -she looks well, her normal self.  No cough, wheezing, distress is noted  Assessment and Plan: Bilateral pulmonary embolism ([place]) - Plan: Ambulatory referral to Hematology  COVID-19 virus infection  Generally healthy [place] woman following up from ER visit today She was seen at urgent care twice right around Thanksgiving with some chest discomfort, originally thought to be musculoskeletal but then a D-dimer was positive.  This led to a CT angiogram which showed multiple bilateral pulmonary emboli as well as evidence of pneumonia.  She was seen in the ER at which point a COVID-19 test was positive  She is currently taking steroids and Xarelto  Our main question at this time is how long does she need to continue anticoagulation-we are not sure if she may have a clotting disorder, or if PE were just due to her Covid infection  She will continue Xarelto for now, referred to hematology for evaluation Since she is on Xarelto I feel she may continue her combination birth control pill at this time.  However she may also stop taking it if she feels more comfortable  I advised her to rest, drink plenty of fluids, keep her lungs open with several deep breaths per hour She should remain out of work for at least 2 weeks She will contact me if not getting better in the next several days-if any acute worsening or distress go to the emergency room  Signed Lamar Blinks, MD

## 2019-05-29 ENCOUNTER — Ambulatory Visit (INDEPENDENT_AMBULATORY_CARE_PROVIDER_SITE_OTHER): Payer: 59 | Admitting: Family Medicine

## 2019-05-29 ENCOUNTER — Other Ambulatory Visit: Payer: Self-pay

## 2019-05-29 ENCOUNTER — Encounter: Payer: Self-pay | Admitting: Family Medicine

## 2019-05-29 DIAGNOSIS — I2699 Other pulmonary embolism without acute cor pulmonale: Secondary | ICD-10-CM | POA: Diagnosis not present

## 2019-05-29 DIAGNOSIS — U071 COVID-19: Secondary | ICD-10-CM

## 2019-05-29 MED ORDER — ALBUTEROL SULFATE HFA 108 (90 BASE) MCG/ACT IN AERS: 1.0000 | INHALATION_SPRAY | Freq: Four times a day (QID) | RESPIRATORY_TRACT | 3 refills | Status: DC | PRN

## 2019-05-30 ENCOUNTER — Telehealth: Payer: Self-pay | Admitting: Family

## 2019-05-30 NOTE — Telephone Encounter (Signed)
lmom to inform patient of new patient appt 12/29 at 1 pm

## 2019-06-15 ENCOUNTER — Encounter: Payer: Self-pay | Admitting: Family Medicine

## 2019-06-17 ENCOUNTER — Other Ambulatory Visit: Payer: Self-pay | Admitting: Family

## 2019-06-17 DIAGNOSIS — I2699 Other pulmonary embolism without acute cor pulmonale: Secondary | ICD-10-CM

## 2019-06-17 DIAGNOSIS — D6859 Other primary thrombophilia: Secondary | ICD-10-CM

## 2019-06-18 ENCOUNTER — Telehealth: Payer: Self-pay | Admitting: Family

## 2019-06-18 ENCOUNTER — Other Ambulatory Visit: Payer: Self-pay

## 2019-06-18 ENCOUNTER — Inpatient Hospital Stay: Payer: 59 | Attending: Family | Admitting: Family

## 2019-06-18 ENCOUNTER — Inpatient Hospital Stay: Payer: 59

## 2019-06-18 ENCOUNTER — Ambulatory Visit (HOSPITAL_BASED_OUTPATIENT_CLINIC_OR_DEPARTMENT_OTHER)
Admission: RE | Admit: 2019-06-18 | Discharge: 2019-06-18 | Disposition: A | Discharge status: Home / Self Care | Payer: 59 | Source: Ambulatory Visit | Attending: Family | Admitting: Family

## 2019-06-18 ENCOUNTER — Encounter: Payer: Self-pay | Admitting: Family

## 2019-06-18 VITALS — BP 138/76 | HR 110 | Temp 97.5°F | Resp 18 | Ht 67.0 in | Wt 222.0 lb

## 2019-06-18 DIAGNOSIS — Z8249 Family history of ischemic heart disease and other diseases of the circulatory system: Secondary | ICD-10-CM | POA: Diagnosis not present

## 2019-06-18 DIAGNOSIS — D6859 Other primary thrombophilia: Secondary | ICD-10-CM | POA: Insufficient documentation

## 2019-06-18 DIAGNOSIS — I2693 Single subsegmental pulmonary embolism without acute cor pulmonale: Secondary | ICD-10-CM | POA: Insufficient documentation

## 2019-06-18 DIAGNOSIS — Z7901 Long term (current) use of anticoagulants: Secondary | ICD-10-CM | POA: Diagnosis not present

## 2019-06-18 DIAGNOSIS — U071 COVID-19: Secondary | ICD-10-CM | POA: Diagnosis present

## 2019-06-18 DIAGNOSIS — F419 Anxiety disorder, unspecified: Secondary | ICD-10-CM | POA: Diagnosis not present

## 2019-06-18 DIAGNOSIS — R0781 Pleurodynia: Secondary | ICD-10-CM | POA: Diagnosis not present

## 2019-06-18 DIAGNOSIS — I2699 Other pulmonary embolism without acute cor pulmonale: Secondary | ICD-10-CM

## 2019-06-18 DIAGNOSIS — Z79899 Other long term (current) drug therapy: Secondary | ICD-10-CM | POA: Insufficient documentation

## 2019-06-18 LAB — RETICULOCYTES
Immature Retic Fract: 6.1 % (ref 2.3–15.9)
RBC.: 4.64 MIL/uL (ref 3.87–5.11)
Retic Count, Absolute: 77 10*3/uL (ref 19.0–186.0)
Retic Ct Pct: 1.7 % (ref 0.4–3.1)

## 2019-06-18 LAB — CMP (CANCER CENTER ONLY)
ALT: 141 U/L — ABNORMAL HIGH (ref 0–44)
AST: 30 U/L (ref 15–41)
Albumin: 3.8 g/dL (ref 3.5–5.0)
Alkaline Phosphatase: 53 U/L (ref 38–126)
Anion gap: 10 (ref 5–15)
BUN: 6 mg/dL (ref 6–20)
CO2: 24 mmol/L (ref 22–32)
Calcium: 9 mg/dL (ref 8.9–10.3)
Chloride: 107 mmol/L (ref 98–111)
Creatinine: 0.73 mg/dL (ref 0.44–1.00)
GFR, Est AFR Am: >60 mL/min (ref >60)
GFR, Estimated: >60 mL/min (ref >60)
Glucose, Bld: 179 mg/dL — ABNORMAL HIGH (ref 70–99)
Potassium: 3.3 mmol/L — ABNORMAL LOW (ref 3.5–5.1)
Sodium: 141 mmol/L (ref 135–145)
Total Bilirubin: 0.5 mg/dL (ref 0.3–1.2)
Total Protein: 6.1 g/dL — ABNORMAL LOW (ref 6.5–8.1)

## 2019-06-18 LAB — CBC WITH DIFFERENTIAL (CANCER CENTER ONLY)
Abs Immature Granulocytes: 0.01 10*3/uL (ref 0.00–0.07)
Basophils Absolute: 0 10*3/uL (ref 0.0–0.1)
Basophils Relative: 0 %
Eosinophils Absolute: 0 10*3/uL (ref 0.0–0.5)
Eosinophils Relative: 0 %
HCT: 40.8 % (ref 36.0–46.0)
Hemoglobin: 13.6 g/dL (ref 12.0–15.0)
Immature Granulocytes: 0 %
Lymphocytes Relative: 24 %
Lymphs Abs: 1.2 10*3/uL (ref 0.7–4.0)
MCH: 29.1 pg (ref 26.0–34.0)
MCHC: 33.3 g/dL (ref 30.0–36.0)
MCV: 87.4 fL (ref 80.0–100.0)
Monocytes Absolute: 0.2 10*3/uL (ref 0.1–1.0)
Monocytes Relative: 3 %
Neutro Abs: 3.6 10*3/uL (ref 1.7–7.7)
Neutrophils Relative %: 73 %
Platelet Count: 217 10*3/uL (ref 150–400)
RBC: 4.67 MIL/uL (ref 3.87–5.11)
RDW: 12.8 % (ref 11.5–15.5)
WBC Count: 5 10*3/uL (ref 4.0–10.5)
nRBC: 0 % (ref 0.0–0.2)

## 2019-06-18 LAB — SAVE SMEAR(SSMR), FOR PROVIDER SLIDE REVIEW

## 2019-06-18 LAB — ANTITHROMBIN III: AntiThromb III Func: 117 % (ref 75–120)

## 2019-06-18 MED ORDER — RIVAROXABAN 20 MG PO TABS: 20.0000 mg | ORAL_TABLET | Freq: Every day | ORAL | 1 refills | Status: DC

## 2019-06-18 NOTE — Progress Notes (Signed)
Hematology/Oncology Consultation   Name: Kayla Hayes      MRN: [id number]    Location: Room/bed info not found  Date: 06/18/2019 Time:1:22 PM   REFERRING PHYSICIAN: Lamar Blinks, MD  REASON FOR CONSULT: Bilateral pulmonary emboli    DIAGNOSIS: Bilateral pulmonary emboli   HISTORY OF PRESENT ILLNESS: Ms. Kayla Hayes is a very pleasant 18 yo caucasian female diagnosed with both Covid and bilateral subsegmental pulmonary emboli (by CT angio and positive d-dimer) on 05/24/2019. She has no history of thrombus in the past.  She did not have an [place] of her legs.  So far, she is tolerating Xarelto 20 mg PO daily nicely.  She is still currently on estrogen based birth control which we recommend she stop immediately.   She has never been pregnant or miscarried. She was placed on birth control to regulate her cycle and reduce cramping. She did have a heavy cycle this month after starting anticoagulation.  No family history of thrombus. Maternal grandfather had history of stroke.  Her mother has history of 1, possibly 2, miscarriages.   No fever, chills, n/v, cough, rash, dizziness, SOB, chest pain, palpitations, abdominal pain or changes in bowel or bladder habits.  No swelling, tenderness, numbness or tingling in her extremities.  She has maintained a good appetite and is staying well hydrated. Weight described as stable.  She does not smoke, use recreational drugs or drink alcoholic beverages.  She is currently in college for elementary education.   ROS: All other 10 point review of systems is negative.   PAST MEDICAL HISTORY:   Past Medical History:  Diagnosis Date  . Anxiety   . Asthma     ALLERGIES: No Known Allergies    MEDICATIONS:  Current Outpatient Medications on File Prior to Visit  Medication Sig Dispense Refill  . albuterol (VENTOLIN HFA) 108 (90 Base) MCG/ACT inhaler Inhale 1-2 puffs into the lungs every 6 (six) hours as needed for wheezing or shortness of breath. 18 g 3  .  Ascorbic Acid (VITAMIN C PO) Take 1 tablet by mouth at bedtime.    . cetirizine (ZYRTEC) 10 MG tablet Take 1 tablet (10 mg total) by mouth daily. Use as needed for allergies (Patient taking differently: Take 10 mg by mouth at bedtime. Use as needed for allergies) 90 tablet 3  . dexamethasone (DECADRON) 6 MG tablet Take 1 tablet (6 mg total) by mouth daily. 10 tablet 0  . LILLOW 0.15-30 MG-MCG tablet Take 1 tablet by mouth at bedtime.   4  . Rivaroxaban 15 & 20 MG TBPK Follow package directions: Take one 15mg  tablet by mouth twice a day. On day 22, switch to one 20mg  tablet once a day. Take with food. 51 each 0  . venlafaxine XR (EFFEXOR-XR) 75 MG 24 hr capsule Take 2 capsules (150 mg total) by mouth daily. (Patient taking differently: Take 150 mg by mouth at bedtime. ) 180 capsule 3  . VITAMIN D PO Take 1 tablet by mouth at bedtime.     No current facility-administered medications on file prior to visit.     PAST SURGICAL HISTORY No past surgical history on file.  FAMILY HISTORY: Family History  Problem Relation Age of Onset  . Heart disease Paternal Grandfather     SOCIAL HISTORY:  reports that she has never smoked. She has never used smokeless tobacco. She reports that she does not drink alcohol or use drugs.  PERFORMANCE STATUS: The patient's performance status is 1 - Symptomatic but  completely ambulatory  PHYSICAL EXAM: Most Recent Vital Signs: There were no vitals taken for this visit. There were no vitals taken for this visit.  General Appearance:    Alert, cooperative, no distress, appears stated age  Head:    Normocephalic, without obvious abnormality, atraumatic  Eyes:    PERRL, conjunctiva/corneas clear, EOM's intact, fundi    benign, both eyes        Throat:   Lips, mucosa, and tongue normal; teeth and gums normal  Neck:   Supple, symmetrical, trachea midline, no adenopathy;    thyroid:  no enlargement/tenderness/nodules; no carotid   bruit or JVD  Back:      Symmetric, no curvature, ROM normal, no CVA tenderness  Lungs:     Clear to auscultation bilaterally, respirations unlabored  Chest Wall:    No tenderness or deformity   Heart:    Regular rate and rhythm, S1 and S2 normal, no murmur, rub   or gallop     Abdomen:     Soft, non-tender, bowel sounds active all four quadrants,    no masses, no organomegaly        Extremities:   Extremities normal, atraumatic, no cyanosis or edema  Pulses:   2+ and symmetric all extremities  Skin:   Skin color, texture, turgor normal, no rashes or lesions  Lymph nodes:   Cervical, supraclavicular, and axillary nodes normal  Neurologic:   CNII-XII intact, normal strength, sensation and reflexes    throughout    LABORATORY DATA:  No results found for this or any previous visit (from the past 48 hour(s)).    RADIOGRAPHY: No results found.     PATHOLOGY: None  ASSESSMENT/PLAN: Ms. Kayla Hayes is a very pleasant 18 yo caucasian female diagnosed with both Covid and bilateral subsegmental pulmonary emboli. We did an extensive lab work up to rule out any potential underlying genetic factors that could have contributed to her developing the blood clots. Results are pending.  She has also remained on an estrogen based birth control that we recommend she stop today.  She has a follow-up soon with her gynecologist and will discuss progesterone only birth control options at that time. She will complete 3 months of anticoagulation with Xarelto.  She is headed downstairs now for [place] of her legs to rule out DVT.  We will call her to go over results once we have all her lab work in hand.  We will plan to see her back in another 2 months.   All questions were answered and she is in agreement with the plan. She will contact our office with any questions or concerns. We can certainly see her sooner if needed.   She was discussed with and also seen by Dr. Maylon Peppers and he is in agreement with the aforementioned.   Laverna Peace,  NP     Addendum:  I interviewed and examined the patient.  I reviewed the patient's records extensively as well as NP Marche Hottenstein's note, and agree with the plan as detailed.   In summary, patient was diagnosed with Covid and small bilateral PTE after presenting with new onset shortness of breath and pleuritic chest pain.  She was discharged with Xarelto, which she has completed starter pack.  She is tolerating Xarelto relatively well, except worsening menstrual bleeding since starting Xarelto.  She denies any other symptoms of bleeding, such as hematochezia, melena, or hematuria.  She takes estrogen-based contraceptives to regulate menstrual cramping.  She denies any family history of  thromboembolic disorder.  She does not smoke, drink, or use illicit drugs.  She denies any recent travel, injury, or prolonged immobility.  I reviewed with the patient about the plan for care for PTE.  This last episode of blood clot appeared to be provoked in the setting of COVID.  Given her [place] age, it would be reasonable to rule out some of the more common hypercoagulable conditions, including antiphospholipid syndrome, Factor V Leiden, and prothrombin gene mutation.  As she appears to tolerate Xarelto relatively well, I would recommend continuing Xarelto for a total of 3 months based on the recommendation from [place] of Hematology for patients with PTE related to Covid.  As estrogen-based contraceptives can increase her risk of recurrent PTE, I recommend the patient to discuss with her gynecologist regarding other forms of non-estrogen based therapy.  As she has not had Doppler of lower extremity, I recommend obtaining Doppler to rule out lower extremity DVT.  Finally, I reinforced the importance of preventive strategies such as avoiding hormonal supplement, avoiding cigarette smoking, keeping up-to-date with screening programs for early cancer detection, frequent ambulation for long distance travel and  aggressive DVT prophylaxis in all surgical settings.  Should she need any interruption of the anticoagulation for elective procedures in the future, feel free to contact me regarding peri-operative management.  A total of more than 25 minutes were spent face-to-face with the patient during this encounter and over half of that time was spent on counseling and coordination of care as outlined above.  Tish Men, MD 06/20/2019 12:25 PM

## 2019-06-18 NOTE — Telephone Encounter (Signed)
Called and LMVM for patient regarding appointments added per 12/29 los

## 2019-06-19 LAB — PROTEIN S ACTIVITY: Protein S Activity: 92 % (ref 63–140)

## 2019-06-19 LAB — LACTATE DEHYDROGENASE: LDH: 250 U/L — ABNORMAL HIGH (ref 98–192)

## 2019-06-19 LAB — PROTEIN S, TOTAL: Protein S Ag, Total: 75 % (ref 60–150)

## 2019-06-19 LAB — HOMOCYSTEINE: Homocysteine: 3.6 umol/L (ref 0.0–14.5)

## 2019-06-19 LAB — FERRITIN: Ferritin: 217 ng/mL (ref 11–307)

## 2019-06-19 LAB — ERYTHROPOIETIN: Erythropoietin: 15.5 m[IU]/mL (ref 2.6–18.5)

## 2019-06-19 LAB — PROTEIN C ACTIVITY: Protein C Activity: 116 % (ref 73–180)

## 2019-06-20 ENCOUNTER — Encounter: Payer: Self-pay | Admitting: Family

## 2019-06-20 LAB — CARDIOLIPIN ANTIBODIES, IGG, IGM, IGA
Anticardiolipin IgA: <9 APL U/mL (ref 0–11)
Anticardiolipin IgG: <9 GPL U/mL (ref 0–14)
Anticardiolipin IgM: 31 MPL U/mL — ABNORMAL HIGH (ref 0–12)

## 2019-06-20 LAB — LUPUS ANTICOAGULANT PANEL
DRVVT: 59.3 s — ABNORMAL HIGH (ref 0.0–47.0)
PTT Lupus Anticoagulant: 31 s (ref 0.0–51.9)

## 2019-06-20 LAB — BETA-2-GLYCOPROTEIN I ABS, IGG/M/A
Beta-2 Glyco I IgG: <9 GPI IgG units (ref 0–20)
Beta-2-Glycoprotein I IgA: <9 GPI IgA units (ref 0–25)
Beta-2-Glycoprotein I IgM: <9 GPI IgM units (ref 0–32)

## 2019-06-20 LAB — DRVVT CONFIRM: dRVVT Confirm: 1.4 ratio — ABNORMAL HIGH (ref 0.8–1.2)

## 2019-06-20 LAB — DRVVT MIX: dRVVT Mix: 48.2 s — ABNORMAL HIGH (ref 0.0–40.4)

## 2019-06-24 LAB — PROTEIN C, TOTAL: Protein C, Total: 103 % (ref 60–150)

## 2019-06-24 LAB — PROTHROMBIN GENE MUTATION

## 2019-06-24 LAB — FACTOR 5 LEIDEN

## 2019-06-28 ENCOUNTER — Encounter: Payer: Self-pay | Admitting: Family Medicine

## 2019-07-01 ENCOUNTER — Other Ambulatory Visit: Payer: Self-pay | Admitting: Family

## 2019-07-01 ENCOUNTER — Telehealth: Payer: 59 | Admitting: Physician Assistant

## 2019-07-01 ENCOUNTER — Telehealth: Payer: Self-pay | Admitting: Family

## 2019-07-01 DIAGNOSIS — J329 Chronic sinusitis, unspecified: Secondary | ICD-10-CM | POA: Diagnosis not present

## 2019-07-01 DIAGNOSIS — D6859 Other primary thrombophilia: Secondary | ICD-10-CM

## 2019-07-01 DIAGNOSIS — I2699 Other pulmonary embolism without acute cor pulmonale: Secondary | ICD-10-CM

## 2019-07-01 MED ORDER — DOXYCYCLINE HYCLATE 100 MG PO TABS: 100.0000 mg | ORAL_TABLET | Freq: Two times a day (BID) | ORAL | 0 refills | Status: DC

## 2019-07-01 NOTE — Progress Notes (Signed)
Hi Kayla Hayes,   I am sorry that you are not feeling well. I see you have had a few sinus infections this past year.  If you have another sinus infection in the next few weeks - months, there could be an underlying problem. So please follow-up with Dr. Edilia Bo at that time. You may need to have imaging done or to see a specialist.   Here is how we plan to help!  Based on what you have shared with me it looks like you have sinusitis.  Sinusitis is inflammation and infection in the sinus cavities of the head.  Based on your presentation I believe you most likely have Acute Bacterial Sinusitis.  This is an infection caused by bacteria and is treated with antibiotics. I have prescribed Doxycycline 100mg  by mouth twice a day for 10 days.   You may use an oral decongestant such as Mucinex D or if you have glaucoma or high blood pressure use plain Mucinex. Saline nasal spray help and can safely be used as often as needed for congestion.  If you develop worsening sinus pain, fever or notice severe headache and vision changes, or if symptoms are not better after completion of antibiotic, please schedule an appointment with a health care provider.    Sinus infections are not as easily transmitted as other respiratory infection, however we still recommend that you avoid close contact with loved ones, especially the very [place] and elderly.  Remember to wash your hands thoroughly throughout the day as this is the number one way to prevent the spread of infection!  Home Care:  Only take medications as instructed by your medical team.  Complete the entire course of an antibiotic.  Do not take these medications with alcohol.  A steam or ultrasonic humidifier can help congestion.  You can place a towel over your head and breathe in the steam from hot water coming from a faucet.  Avoid close contacts especially the very [place] and the elderly.  Cover your mouth when you cough or sneeze.  Always remember to wash  your hands.  Get Help Right Away If:  You develop worsening fever or sinus pain.  You develop a severe head ache or visual changes.  Your symptoms persist after you have completed your treatment plan.  Make sure you  Understand these instructions.  Will watch your condition.  Will get help right away if you are not doing well or get worse.  Your e-visit answers were reviewed by a board certified advanced clinical practitioner to complete your personal care plan.  Depending on the condition, your plan could have included both over the counter or prescription medications.  If there is a problem please reply  once you have received a response from your provider.  Your safety is important to [place].  If you have drug allergies check your prescription carefully.    You can use MyChart to ask questions about today's visit, request a non-urgent call back, or ask for a work or school excuse for 24 hours related to this e-Visit. If it has been greater than 24 hours you will need to follow up with your provider, or enter a new e-Visit to address those concerns.  You will get an e-mail in the next two days asking about your experience.  I hope that your e-visit has been valuable and will speed your recovery. Thank you for using e-visits.   Greater than 5 minutes, yet less than 10 minutes of time have been  spent researching, coordinating and implementing care for this patient today.

## 2019-07-01 NOTE — Telephone Encounter (Signed)
I spoke with Kayla Hayes over the phone and went over her recent blood work including her being positive for the Lupus anticoagulant. We will plan to see her back in March for follow-up and repeat lab work. No questions at this time.

## 2019-07-01 NOTE — Addendum Note (Signed)
Addended by: Evelina Dun A on: 07/01/2019 06:03 PM   Modules accepted: Orders

## 2019-07-02 ENCOUNTER — Encounter: Payer: Self-pay | Admitting: Family Medicine

## 2019-08-11 ENCOUNTER — Other Ambulatory Visit: Payer: Self-pay | Admitting: Family Medicine

## 2019-08-11 DIAGNOSIS — F411 Generalized anxiety disorder: Secondary | ICD-10-CM

## 2019-08-17 NOTE — Progress Notes (Signed)
[facility] [address] [place], [place] [address] [phone]  Date:  08/19/2019   Name:  Kayla Hayes   DOB:  [birth year]   MRN:  [id number]  PCP:  Darreld Mclean, MD    Chief Complaint: Annual Exam   History of Present Illness:  Kayla Hayes is a 19 y.o. very pleasant female patient who presents with the following:  Kayla Hayes healthy [place] woman here today for physical exam History of depression/anxiety and asthma I saw her most recently in December, for hospital follow-up In early December she tested positive for COVID-19, she then had a D-dimer and CT angio which showed pulmonary emboli.  She was seen in the ER on 12/4, started on Xarelto and discharged home  She is being cared for by hematology, she did test positive for lupus anticoagulant She was actually seen by hematology earlier today; Impression and Plan: Kayla Hayes is a very pleasant 19 yo caucasian female with bilateral pulmonary emboli. 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 We redrew these lab again today.  She is doing well on Xarelto and will continue her same regimen for 1 full year.  We will repeat a CT angio this week to assess her response to therapy.  We will plan to see her again in 3 months for follow-up.   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  Otherwise it is not painful  She also feels that she may have earwax  buildup again, she gets this regularly.  Her ears are itchy and feel full Patient Active Problem List   Diagnosis Date Noted  . Bilateral pulmonary embolism ([facility]) 05/24/2019  . COVID-19 virus infection 05/24/2019  . Thrombocytopenia ([place]) 05/24/2019  . Anxiety 11/29/2018  . Asthma 08/08/2012    Past Medical History:  Diagnosis Date  . Anxiety   . Asthma     History reviewed. No pertinent surgical history.  Social History   Tobacco Use  . Smoking status: Never Smoker  . Smokeless tobacco: Never Used  Substance Use Topics  . Alcohol use: No  . Drug use: No    Family History  Problem Relation Age of Onset  . Heart disease Paternal Grandfather     No Known Allergies  Medication list has been reviewed and updated.  Current Outpatient Medications on File Prior to Visit  Medication Sig Dispense Refill  . albuterol (VENTOLIN HFA) 108 (90 Base) MCG/ACT inhaler Inhale 1-2 puffs into the lungs every 6 (six) hours as needed for wheezing or shortness of breath. 18 g 3  . Ascorbic Acid (VITAMIN C PO) Take 1 tablet by mouth at bedtime.    . cetirizine (ZYRTEC) 10 MG tablet Take 1 tablet (10 mg total) by mouth daily. Use as needed for allergies (Patient taking differently: Take 10 mg by mouth at bedtime.  Use as needed for allergies) 90 tablet 3  . norethindrone (MICRONOR) 0.35 MG tablet Take 1 tablet by mouth daily.    Marland Kitchen venlafaxine XR (EFFEXOR-XR) 75 MG 24 hr capsule TAKE 2 CAPSULES (150 MG TOTAL) BY MOUTH DAILY. 60 capsule 11  . VITAMIN D PO Take 1 tablet by mouth at bedtime.     No current facility-administered medications on file prior to visit.    Review of Systems:  As per HPI- otherwise negative.  Her ears are stopped up She is using q tips  Physical Examination: Vitals:   08/19/19 1105  BP: 110/80  Pulse: (!) 103  Resp: 16  Temp: (!) 97.1 F (36.2 C)  SpO2: 98%   Vitals:   08/19/19 1105  Weight: 234 lb (106.1 kg)  Height: 5\' 7"  (1.702 m)   Body mass index  is 36.65 kg/m. Ideal Body Weight: Weight in (lb) to have BMI = 25: 159.3  GEN: no acute distress.  Overweight, otherwise appears well HEENT: Atraumatic, Normocephalic.   Bilateral TM wnl, oropharynx normal.  PEERL,EOMI.   Ears and Nose: No external deformity. CV: RRR, No M/G/R. No JVD. No thrill. No extra heart sounds. PULM: CTA B, no wheezes, crackles, rhonchi. No retractions. No resp. distress. No accessory muscle use. ABD: S, NT, ND, +BS. No rebound. No HSM. EXTR: No c/c/e PSYCH: Normally interactive. Conversant.  The palmar aspect of the right long finger, right at the base of the finger overlying the MCP joint, displays a tiny firm mobile nodule, most consistent with a cyst Both ears noted to have impacted cerumen.  Verbal consent obtained, irrigated both ears and removed cerumen with no complications  Patient noted improvement in her hearing and comfort   Assessment and Plan: Physical exam  GAD (generalized anxiety disorder) - Plan: venlafaxine XR (EFFEXOR-XR) 150 MG 24 hr capsule  Immunization due - Plan: Td vaccine greater than or equal to 7yo preservative free IM  Bilateral impacted cerumen  Bilateral pulmonary embolism (HCC)  Here today for physical exam.  She is doing well on her current dose of Effexor, wishes to continue this medication.  Refilled for her today She has been treated and monitored by hematology for recent pulmonary embolism associated with COVID-19 and lupus anticoagulant syndrome.  She is taking Xarelto and plans to continue this for a year Removed earwax today Update tetanus Discussed small cyst in her hand.  Offered to have her seen by hand surgery.  She prefers to wait a while longer and see if this may resolve.  She has had a lot of health care recently with her pulmonary embolism. We will plan to check her lipids at next routine blood draw This visit occurred during the SARS-CoV-2 public health emergency.  Safety protocols were in place, including  screening questions prior to the visit, additional usage of staff PPE, and extensive cleaning of exam room while observing appropriate contact time as indicated for disinfecting solutions.    Signed Lamar Blinks, MD

## 2019-08-19 ENCOUNTER — Ambulatory Visit (INDEPENDENT_AMBULATORY_CARE_PROVIDER_SITE_OTHER): Payer: 59 | Admitting: Family Medicine

## 2019-08-19 ENCOUNTER — Inpatient Hospital Stay: Payer: 59 | Attending: Family

## 2019-08-19 ENCOUNTER — Inpatient Hospital Stay (HOSPITAL_BASED_OUTPATIENT_CLINIC_OR_DEPARTMENT_OTHER): Payer: 59 | Admitting: Family

## 2019-08-19 ENCOUNTER — Encounter: Payer: Self-pay | Admitting: Family

## 2019-08-19 ENCOUNTER — Encounter: Payer: Self-pay | Admitting: Family Medicine

## 2019-08-19 ENCOUNTER — Other Ambulatory Visit: Payer: Self-pay

## 2019-08-19 VITALS — BP 127/83 | HR 111 | Temp 97.5°F | Resp 18 | Ht 67.0 in | Wt 233.0 lb

## 2019-08-19 VITALS — BP 110/80 | HR 103 | Temp 97.1°F | Resp 16 | Ht 67.0 in | Wt 234.0 lb

## 2019-08-19 DIAGNOSIS — Z0000 Encounter for general adult medical examination without abnormal findings: Secondary | ICD-10-CM | POA: Diagnosis not present

## 2019-08-19 DIAGNOSIS — I2699 Other pulmonary embolism without acute cor pulmonale: Secondary | ICD-10-CM

## 2019-08-19 DIAGNOSIS — D6859 Other primary thrombophilia: Secondary | ICD-10-CM | POA: Diagnosis not present

## 2019-08-19 DIAGNOSIS — D6862 Lupus anticoagulant syndrome: Secondary | ICD-10-CM | POA: Diagnosis not present

## 2019-08-19 DIAGNOSIS — Z7901 Long term (current) use of anticoagulants: Secondary | ICD-10-CM | POA: Insufficient documentation

## 2019-08-19 DIAGNOSIS — Z23 Encounter for immunization: Secondary | ICD-10-CM

## 2019-08-19 DIAGNOSIS — F411 Generalized anxiety disorder: Secondary | ICD-10-CM

## 2019-08-19 DIAGNOSIS — H6123 Impacted cerumen, bilateral: Secondary | ICD-10-CM

## 2019-08-19 LAB — CMP (CANCER CENTER ONLY)
ALT: 24 U/L (ref 0–44)
AST: 16 U/L (ref 15–41)
Albumin: 4.4 g/dL (ref 3.5–5.0)
Alkaline Phosphatase: 66 U/L (ref 38–126)
Anion gap: 8 (ref 5–15)
BUN: 8 mg/dL (ref 6–20)
CO2: 28 mmol/L (ref 22–32)
Calcium: 9.6 mg/dL (ref 8.9–10.3)
Chloride: 106 mmol/L (ref 98–111)
Creatinine: 0.75 mg/dL (ref 0.44–1.00)
GFR, Est AFR Am: >60 mL/min (ref >60)
GFR, Estimated: >60 mL/min (ref >60)
Glucose, Bld: 108 mg/dL — ABNORMAL HIGH (ref 70–99)
Potassium: 3.4 mmol/L — ABNORMAL LOW (ref 3.5–5.1)
Sodium: 142 mmol/L (ref 135–145)
Total Bilirubin: 0.4 mg/dL (ref 0.3–1.2)
Total Protein: 6.8 g/dL (ref 6.5–8.1)

## 2019-08-19 LAB — CBC WITH DIFFERENTIAL (CANCER CENTER ONLY)
Abs Immature Granulocytes: 0.02 10*3/uL (ref 0.00–0.07)
Basophils Absolute: 0 10*3/uL (ref 0.0–0.1)
Basophils Relative: 0 %
Eosinophils Absolute: 0 10*3/uL (ref 0.0–0.5)
Eosinophils Relative: 0 %
HCT: 41.4 % (ref 36.0–46.0)
Hemoglobin: 14.1 g/dL (ref 12.0–15.0)
Immature Granulocytes: 0 %
Lymphocytes Relative: 15 %
Lymphs Abs: 1 10*3/uL (ref 0.7–4.0)
MCH: 29.3 pg (ref 26.0–34.0)
MCHC: 34.1 g/dL (ref 30.0–36.0)
MCV: 86.1 fL (ref 80.0–100.0)
Monocytes Absolute: 0.5 10*3/uL (ref 0.1–1.0)
Monocytes Relative: 7 %
Neutro Abs: 5.2 10*3/uL (ref 1.7–7.7)
Neutrophils Relative %: 78 %
Platelet Count: 255 10*3/uL (ref 150–400)
RBC: 4.81 MIL/uL (ref 3.87–5.11)
RDW: 12.3 % (ref 11.5–15.5)
WBC Count: 6.7 10*3/uL (ref 4.0–10.5)
nRBC: 0 % (ref 0.0–0.2)

## 2019-08-19 LAB — D-DIMER, QUANTITATIVE: D-Dimer, Quant: <0.27 ug/mL-FEU (ref 0.00–0.50)

## 2019-08-19 MED ORDER — VENLAFAXINE HCL ER 150 MG PO CP24: 150.0000 mg | ORAL_CAPSULE | Freq: Every day | ORAL | 3 refills | Status: DC

## 2019-08-19 NOTE — Progress Notes (Signed)
Hematology and Oncology Follow Up Visit  ANALIZ ESSARY [id number] 2001/02/07 19 y.o. 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 She is doing well and has no complaints at this time.  She is now on a progesterone based birth control, started almost a month ago.  No episodes of abnormal bleeding. No bruising or petechiae on Xarelto.  D-dimer is now down to < 0.27.  No fever, chills, n/v, cough, rash, dizziness, SOB, chest pain, palpitations, abdominal pain or changes in bowel or bladder habits.  No swelling, tenderness, numbness or tingling in her extremities at this time.  She states that she will occasionally have puffiness in her ankles. This comes and goes. None present on today's exam. Pedal pulses are +3.  She has maintained a good appetite and is staying well hydrated. Her weight is stable.   ECOG Performance Status: 1 - Symptomatic but completely ambulatory  Medications:  Allergies as of 08/19/2019   No Known Allergies     Medication List       Accurate as of August 19, 2019  9:51 AM. If you have any questions, ask your nurse or doctor.        STOP taking these medications   doxycycline 100 MG tablet Commonly known as: VIBRA-TABS Stopped by: Kayla Peace, NP     TAKE these medications   albuterol 108 (90 Base) MCG/ACT inhaler Commonly known as: Ventolin HFA Inhale 1-2 puffs into the lungs every 6 (six) hours as needed for wheezing or shortness of breath.   cetirizine 10 MG tablet Commonly known as: ZYRTEC Take 1 tablet (10 mg total) by mouth daily. Use as needed for allergies What changed: when to take this   norethindrone 0.35 MG tablet Commonly known as: MICRONOR Take 1 tablet by mouth daily.   rivaroxaban 20 MG Tabs tablet Commonly known as: Xarelto Take 1 tablet  (20 mg total) by mouth daily with supper.   venlafaxine XR 75 MG 24 hr capsule Commonly known as: EFFEXOR-XR TAKE 2 CAPSULES (150 MG TOTAL) BY MOUTH DAILY.   VITAMIN C PO Take 1 tablet by mouth at bedtime.   VITAMIN D PO Take 1 tablet by mouth at bedtime.       Allergies: No Known Allergies  Past Medical History, Surgical history, Social history, and Family History were reviewed and updated.  Review of Systems: All other 10 point review of systems is negative.   Physical Exam:  height is 5\' 7"  (1.702 m) and weight is 233 lb (105.7 kg). Her temporal temperature is 97.5 F (36.4 C) (abnormal). Her blood pressure is 127/83 and her pulse is 111 (abnormal). Her respiration is 18 and oxygen saturation is 100%.   Wt Readings from Last 3 Encounters:  08/19/19 233 lb (105.7 kg)  06/18/19 222 lb (100.7 kg) (99 %, Z= 2.23)*  05/24/19 215 lb (97.5 kg) (98 %, Z= 2.15)*   * Growth percentiles are based on CDC (Girls, 2-20 Years) data.    Ocular: Sclerae unicteric, pupils equal, round and reactive to light Ear-nose-throat: Oropharynx clear, dentition fair Lymphatic: No cervical or supraclavicular adenopathy Lungs no rales or rhonchi, good excursion bilaterally Heart regular rate and rhythm, no murmur appreciated Abd soft, nontender, positive bowel sounds, no liver or spleen tip palpated on exam, no fluid wave  MSK no focal  spinal tenderness, no joint edema Neuro: non-focal, well-oriented, appropriate affect Breasts: Deferred   Lab Results  Component Value Date   WBC 6.7 08/19/2019   HGB 14.1 08/19/2019   HCT 41.4 08/19/2019   MCV 86.1 08/19/2019   PLT 255 08/19/2019   Lab Results  Component Value Date   FERRITIN 217 06/18/2019   Lab Results  Component Value Date   RETICCTPCT 1.7 06/18/2019   RBC 4.81 08/19/2019   No results found for: KPAFRELGTCHN, LAMBDASER, KAPLAMBRATIO No results found for: IGGSERUM, IGA, IGMSERUM No results found for: Odetta Pink, SPEI   Chemistry      Component Value Date/Time   NA 142 08/19/2019 0832   K 3.4 (L) 08/19/2019 0832   CL 106 08/19/2019 0832   CO2 28 08/19/2019 0832   BUN 8 08/19/2019 0832   CREATININE 0.75 08/19/2019 0832      Component Value Date/Time   CALCIUM 9.6 08/19/2019 0832   ALKPHOS 66 08/19/2019 0832   AST 16 08/19/2019 0832   ALT 24 08/19/2019 0832   BILITOT 0.4 08/19/2019 0832       Impression and Plan: Kayla Hayes is a very pleasant 19 yo caucasian female with bilateral pulmonary emboli. 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 We redrew these lab again today.  She is doing well on Xarelto and will continue her same regimen for 1 full year.  We will repeat a CT angio this week to assess her response to therapy.  We will plan to see her again in 3 months for follow-up.  She will can contact our office with any questions or concerns. We can certainly see her sooner if needed.    Kayla Peace, NP 3/1/20219:51 AM

## 2019-08-19 NOTE — Patient Instructions (Signed)
Great to see you today- I am glad you are getting better!  We wil update your tetanus vaccine today We can plan to do your cholesterol panel at some point in the future I refilled your effexor- changed to 150 mg so just take 1 capsule daily    Health Maintenance, Female Adopting a healthy lifestyle and getting preventive care are important in promoting health and wellness. Ask your health care provider about:  The right schedule for you to have regular tests and exams.  Things you can do on your own to prevent diseases and keep yourself healthy. What should I know about diet, weight, and exercise? Eat a healthy diet   Eat a diet that includes plenty of vegetables, fruits, low-fat dairy products, and lean protein.  Do not eat a lot of foods that are high in solid fats, added sugars, or sodium. Maintain a healthy weight Body mass index (BMI) is used to identify weight problems. It estimates body fat based on height and weight. Your health care provider can help determine your BMI and help you achieve or maintain a healthy weight. Get regular exercise Get regular exercise. This is one of the most important things you can do for your health. Most adults should:  Exercise for at least 150 minutes each week. The exercise should increase your heart rate and make you sweat (moderate-intensity exercise).  Do strengthening exercises at least twice a week. This is in addition to the moderate-intensity exercise.  Spend less time sitting. Even light physical activity can be beneficial. Watch cholesterol and blood lipids Have your blood tested for lipids and cholesterol at 20 years of age, then have this test every 5 years. Have your cholesterol levels checked more often if:  Your lipid or cholesterol levels are high.  You are older than 20 years of age.  You are at high risk for heart disease. What should I know about cancer screening? Depending on your health history and family history,  you may need to have cancer screening at various ages. This may include screening for:  Breast cancer.  Cervical cancer.  Colorectal cancer.  Skin cancer.  Lung cancer. What should I know about heart disease, diabetes, and high blood pressure? Blood pressure and heart disease  High blood pressure causes heart disease and increases the risk of stroke. This is more likely to develop in people who have high blood pressure readings, are of African descent, or are overweight.  Have your blood pressure checked: ? Every 3-5 years if you are 8-26 years of age. ? Every year if you are 38 years old or older. Diabetes Have regular diabetes screenings. This checks your fasting blood sugar level. Have the screening done:  Once every three years after age 15 if you are at a normal weight and have a low risk for diabetes.  More often and at a younger age if you are overweight or have a high risk for diabetes. What should I know about preventing infection? Hepatitis B If you have a higher risk for hepatitis B, you should be screened for this virus. Talk with your health care provider to find out if you are at risk for hepatitis B infection. Hepatitis C Testing is recommended for:  Everyone born from [birth year] through 1966.  Anyone with known risk factors for hepatitis C. Sexually transmitted infections (STIs)  Get screened for STIs, including gonorrhea and chlamydia, if: ? You are sexually active and are younger than 20 years of age. ? You  are older than 20 years of age and your health care provider tells you that you are at risk for this type of infection. ? Your sexual activity has changed since you were last screened, and you are at increased risk for chlamydia or gonorrhea. Ask your health care provider if you are at risk.  Ask your health care provider about whether you are at high risk for HIV. Your health care provider may recommend a prescription medicine to help prevent HIV infection.  If you choose to take medicine to prevent HIV, you should first get tested for HIV. You should then be tested every 3 months for as long as you are taking the medicine. Pregnancy  If you are about to stop having your period (premenopausal) and you may become pregnant, seek counseling before you get pregnant.  Take 400 to 800 micrograms (mcg) of folic acid every day if you become pregnant.  Ask for birth control (contraception) if you want to prevent pregnancy. Osteoporosis and menopause Osteoporosis is a disease in which the bones lose minerals and strength with aging. This can result in bone fractures. If you are 13 years old or older, or if you are at risk for osteoporosis and fractures, ask your health care provider if you should:  Be screened for bone loss.  Take a calcium or vitamin D supplement to lower your risk of fractures.  Be given hormone replacement therapy (HRT) to treat symptoms of menopause. Follow these instructions at home: Lifestyle  Do not use any products that contain nicotine or tobacco, such as cigarettes, e-cigarettes, and chewing tobacco. If you need help quitting, ask your health care provider.  Do not use street drugs.  Do not share needles.  Ask your health care provider for help if you need support or information about quitting drugs. Alcohol use  Do not drink alcohol if: ? Your health care provider tells you not to drink. ? You are pregnant, may be pregnant, or are planning to become pregnant.  If you drink alcohol: ? Limit how much you use to 0-1 drink a day. ? Limit intake if you are breastfeeding.  Be aware of how much alcohol is in your drink. In the [place] one drink equals one 12 oz bottle of beer (355 mL), one 5 oz glass of wine (148 mL), or one 1 oz glass of hard liquor (44 mL). General instructions  Schedule regular health, dental, and eye exams.  Stay current with your vaccines.  Tell your health care provider if: ? You often feel  depressed. ? You have ever been abused or do not feel safe at home. Summary  Adopting a healthy lifestyle and getting preventive care are important in promoting health and wellness.  Follow your health care provider's instructions about healthy diet, exercising, and getting tested or screened for diseases.  Follow your health care provider's instructions on monitoring your cholesterol and blood pressure. This information is not intended to replace advice given to you by your health care provider. Make sure you discuss any questions you have with your health care provider. Document Revised: 05/30/2018 Document Reviewed: 05/30/2018 Elsevier Patient Education  2020 [employer].

## 2019-08-20 LAB — CARDIOLIPIN ANTIBODIES, IGG, IGM, IGA
Anticardiolipin IgA: <9 APL U/mL (ref 0–11)
Anticardiolipin IgG: <9 GPL U/mL (ref 0–14)
Anticardiolipin IgM: <9 MPL U/mL (ref 0–12)

## 2019-08-21 LAB — LUPUS ANTICOAGULANT PANEL
DRVVT: 73.3 s — ABNORMAL HIGH (ref 0.0–47.0)
PTT Lupus Anticoagulant: 39.3 s (ref 0.0–51.9)

## 2019-08-21 LAB — DRVVT MIX: dRVVT Mix: 56.8 s — ABNORMAL HIGH (ref 0.0–40.4)

## 2019-08-21 LAB — DRVVT CONFIRM: dRVVT Confirm: 1.3 ratio — ABNORMAL HIGH (ref 0.8–1.2)

## 2019-08-26 ENCOUNTER — Encounter: Payer: 59 | Admitting: Family Medicine

## 2019-09-09 ENCOUNTER — Other Ambulatory Visit: Payer: Self-pay | Admitting: *Deleted

## 2019-09-09 DIAGNOSIS — I2699 Other pulmonary embolism without acute cor pulmonale: Secondary | ICD-10-CM

## 2019-09-09 MED ORDER — RIVAROXABAN 20 MG PO TABS: 20.0000 mg | ORAL_TABLET | Freq: Every day | ORAL | 2 refills | Status: DC

## 2019-09-12 ENCOUNTER — Telehealth: Payer: Self-pay | Admitting: *Deleted

## 2019-09-12 ENCOUNTER — Ambulatory Visit (HOSPITAL_BASED_OUTPATIENT_CLINIC_OR_DEPARTMENT_OTHER)
Admission: RE | Admit: 2019-09-12 | Discharge: 2019-09-12 | Disposition: A | Discharge status: Home / Self Care | Payer: 59 | Source: Ambulatory Visit | Attending: Family | Admitting: Family

## 2019-09-12 ENCOUNTER — Encounter (HOSPITAL_BASED_OUTPATIENT_CLINIC_OR_DEPARTMENT_OTHER): Payer: Self-pay

## 2019-09-12 ENCOUNTER — Other Ambulatory Visit: Payer: Self-pay

## 2019-09-12 DIAGNOSIS — I2699 Other pulmonary embolism without acute cor pulmonale: Secondary | ICD-10-CM | POA: Diagnosis not present

## 2019-09-12 DIAGNOSIS — D6859 Other primary thrombophilia: Secondary | ICD-10-CM | POA: Diagnosis present

## 2019-09-12 MED ORDER — IOHEXOL 350 MG/ML SOLN
100.0000 mL | Freq: Once | INTRAVENOUS | Status: AC | PRN
  Administered 2019-09-12: 100 mL via INTRAVENOUS

## 2019-09-12 NOTE — Telephone Encounter (Signed)
Pt notified per order of S. Boaz NP that "Her PE's have resolved!!!!!!!! WOO HOO!!!!!!!! She still needs to stay on Xarelto though!!!!"  Teach back done.   Pt appreciative of call and has no questions or concerns at this time.

## 2019-10-08 ENCOUNTER — Other Ambulatory Visit: Payer: Self-pay | Admitting: Family Medicine

## 2019-10-08 DIAGNOSIS — F411 Generalized anxiety disorder: Secondary | ICD-10-CM

## 2019-10-09 ENCOUNTER — Encounter: Payer: Self-pay | Admitting: Family Medicine

## 2019-10-09 DIAGNOSIS — R112 Nausea with vomiting, unspecified: Secondary | ICD-10-CM

## 2019-10-09 MED ORDER — ONDANSETRON HCL 8 MG PO TABS: 8.0000 mg | ORAL_TABLET | Freq: Three times a day (TID) | ORAL | 0 refills | Status: DC | PRN

## 2019-11-14 ENCOUNTER — Other Ambulatory Visit: Payer: Self-pay | Admitting: Family Medicine

## 2019-11-19 ENCOUNTER — Inpatient Hospital Stay: Payer: 59 | Attending: Family

## 2019-11-19 ENCOUNTER — Inpatient Hospital Stay (HOSPITAL_BASED_OUTPATIENT_CLINIC_OR_DEPARTMENT_OTHER): Payer: 59 | Admitting: Family

## 2019-11-19 ENCOUNTER — Encounter: Payer: Self-pay | Admitting: Family

## 2019-11-19 ENCOUNTER — Other Ambulatory Visit: Payer: Self-pay

## 2019-11-19 VITALS — BP 146/96 | HR 90 | Temp 97.6°F | Resp 18 | Ht 67.0 in | Wt 229.4 lb

## 2019-11-19 DIAGNOSIS — D6859 Other primary thrombophilia: Secondary | ICD-10-CM | POA: Diagnosis not present

## 2019-11-19 DIAGNOSIS — Z86711 Personal history of pulmonary embolism: Secondary | ICD-10-CM | POA: Diagnosis present

## 2019-11-19 DIAGNOSIS — D6862 Lupus anticoagulant syndrome: Secondary | ICD-10-CM | POA: Insufficient documentation

## 2019-11-19 DIAGNOSIS — Z79899 Other long term (current) drug therapy: Secondary | ICD-10-CM | POA: Insufficient documentation

## 2019-11-19 DIAGNOSIS — I2699 Other pulmonary embolism without acute cor pulmonale: Secondary | ICD-10-CM

## 2019-11-19 DIAGNOSIS — Z7901 Long term (current) use of anticoagulants: Secondary | ICD-10-CM | POA: Diagnosis not present

## 2019-11-19 DIAGNOSIS — R760 Raised antibody titer: Secondary | ICD-10-CM

## 2019-11-19 LAB — CMP (CANCER CENTER ONLY)
ALT: 27 U/L (ref 0–44)
AST: 19 U/L (ref 15–41)
Albumin: 4.5 g/dL (ref 3.5–5.0)
Alkaline Phosphatase: 81 U/L (ref 38–126)
Anion gap: 6 (ref 5–15)
BUN: 10 mg/dL (ref 6–20)
CO2: 30 mmol/L (ref 22–32)
Calcium: 9.6 mg/dL (ref 8.9–10.3)
Chloride: 105 mmol/L (ref 98–111)
Creatinine: 0.73 mg/dL (ref 0.44–1.00)
GFR, Est AFR Am: >60 mL/min (ref >60)
GFR, Estimated: >60 mL/min (ref >60)
Glucose, Bld: 87 mg/dL (ref 70–99)
Potassium: 3.9 mmol/L (ref 3.5–5.1)
Sodium: 141 mmol/L (ref 135–145)
Total Bilirubin: 0.4 mg/dL (ref 0.3–1.2)
Total Protein: 6.8 g/dL (ref 6.5–8.1)

## 2019-11-19 LAB — CBC WITH DIFFERENTIAL (CANCER CENTER ONLY)
Abs Immature Granulocytes: 0.03 10*3/uL (ref 0.00–0.07)
Basophils Absolute: 0 10*3/uL (ref 0.0–0.1)
Basophils Relative: 0 %
Eosinophils Absolute: 0 10*3/uL (ref 0.0–0.5)
Eosinophils Relative: 0 %
HCT: 42.9 % (ref 36.0–46.0)
Hemoglobin: 14.4 g/dL (ref 12.0–15.0)
Immature Granulocytes: 1 %
Lymphocytes Relative: 25 %
Lymphs Abs: 1.5 10*3/uL (ref 0.7–4.0)
MCH: 29 pg (ref 26.0–34.0)
MCHC: 33.6 g/dL (ref 30.0–36.0)
MCV: 86.5 fL (ref 80.0–100.0)
Monocytes Absolute: 0.4 10*3/uL (ref 0.1–1.0)
Monocytes Relative: 7 %
Neutro Abs: 4.1 10*3/uL (ref 1.7–7.7)
Neutrophils Relative %: 67 %
Platelet Count: 249 10*3/uL (ref 150–400)
RBC: 4.96 MIL/uL (ref 3.87–5.11)
RDW: 12.2 % (ref 11.5–15.5)
WBC Count: 6 10*3/uL (ref 4.0–10.5)
nRBC: 0 % (ref 0.0–0.2)

## 2019-11-19 LAB — D-DIMER, QUANTITATIVE: D-Dimer, Quant: <0.27 ug/mL-FEU (ref 0.00–0.50)

## 2019-11-19 NOTE — Progress Notes (Signed)
Hematology and Oncology Follow Up Visit  Kayla Hayes [id number] 11-05-2000 19 y.o. 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 She is doing well and has no complaints at this time.  CT angio in March showed resolution of pulmonary emboli. D-dimer today is <0.27.  She is doing well on Xarelto and has had no issues with bleeding. No bruising or petechiae.  She is currently on Micronor for birthcontrol and has not had a cycle for 2 months.  She has had issues with hot flashes and night sweats off and on for 6 months or so.  She has had a rash behind her knees that comes and goes. PCP aware. She will try hydrocortisone cream as needed.  No fever, chills, n/v, cough, dizziness, SOB, chest pain, palpitations, abdominal pain or changes in bowel or bladder habits.  No swelling, tenderness, numbness or tingling in her extremities.  She has maintained a good appetite and is staying well hydrated. Her weight is stable.   ECOG Performance Status: 1 - Symptomatic but completely ambulatory  Medications:  Allergies as of 11/19/2019   No Known Allergies     Medication List       Accurate as of November 19, 2019  9:21 AM. If you have any questions, ask your nurse or doctor.        STOP taking these medications   ondansetron 8 MG tablet Commonly known as: ZOFRAN Stopped by: Laverna Peace, NP     TAKE these medications   albuterol 108 (90 Base) MCG/ACT inhaler Commonly known as: Ventolin HFA Inhale 1-2 puffs into the lungs every 6 (six) hours as needed for wheezing or shortness of breath.   cetirizine 10 MG tablet Commonly known as: ZYRTEC Take 1 tablet (10 mg total) by mouth at bedtime. Use as needed for allergies   norethindrone 0.35 MG tablet Commonly known as: MICRONOR Take 1 tablet by  mouth daily.   rivaroxaban 20 MG Tabs tablet Commonly known as: Xarelto Take 1 tablet (20 mg total) by mouth daily.   venlafaxine XR 150 MG 24 hr capsule Commonly known as: EFFEXOR-XR TAKE 1 CAPSULE BY MOUTH EVERY DAY   VITAMIN C PO Take 1 tablet by mouth at bedtime.   VITAMIN D PO Take 1 tablet by mouth at bedtime.       Allergies: No Known Allergies  Past Medical History, Surgical history, Social history, and Family History were reviewed and updated.  Review of Systems: All other 10 point review of systems is negative.   Physical Exam:  vitals were not taken for this visit.   Wt Readings from Last 3 Encounters:  08/19/19 234 lb (106.1 kg)  08/19/19 233 lb (105.7 kg)  06/18/19 222 lb (100.7 kg) (99 %, Z= 2.23)*   * Growth percentiles are based on CDC (Girls, 2-20 Years) data.    Ocular: Sclerae unicteric, pupils equal, round and reactive to light Ear-nose-throat: Oropharynx clear, dentition fair Lymphatic: No cervical or supraclavicular adenopathy Lungs no rales or rhonchi, good excursion bilaterally Heart regular rate and rhythm, no murmur appreciated Abd soft, nontender, positive bowel sounds, no liver or spleen tip palpated on exam, no fluid wave  MSK no focal spinal tenderness, no joint edema Neuro: non-focal, well-oriented, appropriate affect Breasts: Deferred   Lab Results  Component  Value Date   WBC 6.0 11/19/2019   HGB 14.4 11/19/2019   HCT 42.9 11/19/2019   MCV 86.5 11/19/2019   PLT 249 11/19/2019   Lab Results  Component Value Date   FERRITIN 217 06/18/2019   Lab Results  Component Value Date   RETICCTPCT 1.7 06/18/2019   RBC 4.96 11/19/2019   No results found for: KPAFRELGTCHN, LAMBDASER, KAPLAMBRATIO No results found for: IGGSERUM, IGA, IGMSERUM No results found for: Odetta Pink, SPEI   Chemistry      Component Value Date/Time   NA 142 08/19/2019 0832   K 3.4 (L) 08/19/2019 0832     CL 106 08/19/2019 0832   CO2 28 08/19/2019 0832   BUN 8 08/19/2019 0832   CREATININE 0.75 08/19/2019 0832      Component Value Date/Time   CALCIUM 9.6 08/19/2019 0832   ALKPHOS 66 08/19/2019 0832   AST 16 08/19/2019 0832   ALT 24 08/19/2019 0832   BILITOT 0.4 08/19/2019 0832       Impression and Plan: Kayla Hayes is a very pleasant 19 yo caucasian female with bilateral pulmonary emboli. 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  She is doing well on Xarelto and will continue her same regimen for 1 full year (finishes in December 2021).  We will plan to see her again in 4 months for follow-up.  She will can contact our office with any questions or concerns. We can certainly see her sooner if needed.   Laverna Peace, NP 6/1/20219:21 AM

## 2019-11-20 LAB — CARDIOLIPIN ANTIBODIES, IGG, IGM, IGA
Anticardiolipin IgA: <9 APL U/mL (ref 0–11)
Anticardiolipin IgG: <9 GPL U/mL (ref 0–14)
Anticardiolipin IgM: 9 MPL U/mL (ref 0–12)

## 2019-11-22 LAB — DRVVT MIX: dRVVT Mix: 52.9 s — ABNORMAL HIGH (ref 0.0–40.4)

## 2019-11-22 LAB — LUPUS ANTICOAGULANT PANEL
DRVVT: 71.6 s — ABNORMAL HIGH (ref 0.0–47.0)
PTT Lupus Anticoagulant: 38.2 s (ref 0.0–51.9)

## 2019-11-22 LAB — DRVVT CONFIRM: dRVVT Confirm: 1.5 ratio — ABNORMAL HIGH (ref 0.8–1.2)

## 2019-11-23 ENCOUNTER — Encounter: Payer: Self-pay | Admitting: Family Medicine

## 2019-11-23 DIAGNOSIS — L739 Follicular disorder, unspecified: Secondary | ICD-10-CM

## 2019-11-26 MED ORDER — CEPHALEXIN 500 MG PO CAPS: 500.0000 mg | ORAL_CAPSULE | Freq: Three times a day (TID) | ORAL | 0 refills | Status: DC

## 2019-11-26 NOTE — Addendum Note (Signed)
Addended by: Lamar Blinks C on: 11/26/2019 08:13 AM   Modules accepted: Orders

## 2019-12-16 ENCOUNTER — Other Ambulatory Visit: Payer: Self-pay | Admitting: *Deleted

## 2019-12-16 DIAGNOSIS — I2699 Other pulmonary embolism without acute cor pulmonale: Secondary | ICD-10-CM

## 2019-12-16 MED ORDER — RIVAROXABAN 20 MG PO TABS: 20.0000 mg | ORAL_TABLET | Freq: Every day | ORAL | 2 refills | Status: DC

## 2020-03-19 ENCOUNTER — Other Ambulatory Visit: Payer: Self-pay | Admitting: Hematology & Oncology

## 2020-03-19 DIAGNOSIS — I2699 Other pulmonary embolism without acute cor pulmonale: Secondary | ICD-10-CM

## 2020-03-20 ENCOUNTER — Inpatient Hospital Stay: Payer: 59 | Attending: Family | Admitting: Family

## 2020-03-20 ENCOUNTER — Encounter: Payer: Self-pay | Admitting: Family

## 2020-03-20 ENCOUNTER — Other Ambulatory Visit: Payer: Self-pay

## 2020-03-20 ENCOUNTER — Telehealth: Payer: Self-pay | Admitting: Family

## 2020-03-20 ENCOUNTER — Inpatient Hospital Stay: Payer: 59

## 2020-03-20 VITALS — BP 129/89 | HR 108 | Temp 98.3°F | Resp 19

## 2020-03-20 DIAGNOSIS — Z86711 Personal history of pulmonary embolism: Secondary | ICD-10-CM | POA: Diagnosis not present

## 2020-03-20 DIAGNOSIS — I2699 Other pulmonary embolism without acute cor pulmonale: Secondary | ICD-10-CM | POA: Diagnosis not present

## 2020-03-20 DIAGNOSIS — D6862 Lupus anticoagulant syndrome: Secondary | ICD-10-CM | POA: Diagnosis not present

## 2020-03-20 DIAGNOSIS — R768 Other specified abnormal immunological findings in serum: Secondary | ICD-10-CM | POA: Insufficient documentation

## 2020-03-20 DIAGNOSIS — D6859 Other primary thrombophilia: Secondary | ICD-10-CM

## 2020-03-20 DIAGNOSIS — R760 Raised antibody titer: Secondary | ICD-10-CM | POA: Diagnosis not present

## 2020-03-20 DIAGNOSIS — Z79899 Other long term (current) drug therapy: Secondary | ICD-10-CM | POA: Insufficient documentation

## 2020-03-20 DIAGNOSIS — Z7901 Long term (current) use of anticoagulants: Secondary | ICD-10-CM | POA: Diagnosis not present

## 2020-03-20 LAB — CBC WITH DIFFERENTIAL (CANCER CENTER ONLY)
Abs Immature Granulocytes: 0.06 10*3/uL (ref 0.00–0.07)
Basophils Absolute: 0 10*3/uL (ref 0.0–0.1)
Basophils Relative: 0 %
Eosinophils Absolute: 0 10*3/uL (ref 0.0–0.5)
Eosinophils Relative: 0 %
HCT: 44.3 % (ref 36.0–46.0)
Hemoglobin: 14.9 g/dL (ref 12.0–15.0)
Immature Granulocytes: 1 %
Lymphocytes Relative: 25 %
Lymphs Abs: 1.7 10*3/uL (ref 0.7–4.0)
MCH: 29.2 pg (ref 26.0–34.0)
MCHC: 33.6 g/dL (ref 30.0–36.0)
MCV: 86.7 fL (ref 80.0–100.0)
Monocytes Absolute: 0.5 10*3/uL (ref 0.1–1.0)
Monocytes Relative: 7 %
Neutro Abs: 4.7 10*3/uL (ref 1.7–7.7)
Neutrophils Relative %: 67 %
Platelet Count: 269 10*3/uL (ref 150–400)
RBC: 5.11 MIL/uL (ref 3.87–5.11)
RDW: 12.2 % (ref 11.5–15.5)
WBC Count: 7 10*3/uL (ref 4.0–10.5)
nRBC: 0 % (ref 0.0–0.2)

## 2020-03-20 LAB — CMP (CANCER CENTER ONLY)
ALT: 22 U/L (ref 0–44)
AST: 16 U/L (ref 15–41)
Albumin: 4.5 g/dL (ref 3.5–5.0)
Alkaline Phosphatase: 77 U/L (ref 38–126)
Anion gap: 9 (ref 5–15)
BUN: 11 mg/dL (ref 6–20)
CO2: 27 mmol/L (ref 22–32)
Calcium: 9.9 mg/dL (ref 8.9–10.3)
Chloride: 105 mmol/L (ref 98–111)
Creatinine: 0.78 mg/dL (ref 0.44–1.00)
GFR, Est AFR Am: >60 mL/min (ref >60)
GFR, Estimated: >60 mL/min (ref >60)
Glucose, Bld: 121 mg/dL — ABNORMAL HIGH (ref 70–99)
Potassium: 3.5 mmol/L (ref 3.5–5.1)
Sodium: 141 mmol/L (ref 135–145)
Total Bilirubin: 0.3 mg/dL (ref 0.3–1.2)
Total Protein: 6.8 g/dL (ref 6.5–8.1)

## 2020-03-20 LAB — D-DIMER, QUANTITATIVE: D-Dimer, Quant: <0.27 ug/mL-FEU (ref 0.00–0.50)

## 2020-03-20 NOTE — Progress Notes (Signed)
Hematology and Oncology Follow Up Visit  Kayla Hayes [id number] [birth year] 19 y.o. 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 She is doing quite well and has no complaints at this time.  Lupus anticoagulant was still positive at her last visit in June. Anticardiolipin IgM level was normal at 9.  D-dimer today is < 0.27.  She is doing well on Xarelto and has not had any issue with bleeding, no abnormal bruising, no petechiae.  She stopped the Micronor and had the Kayla Hayes IUD placed a little less than a month ago.  No fever, chills, n/v, cough, rash, dizziness, SOB, chest pain, palpitations, abdominal pain or changes in bowel or bladder habits.  No swelling, tenderness, numbness or tingling in her extremities.  No falls or syncope.  She states that she does not have an appetite but is staying well hydrated. Her weight is stable at 229 lbs.   ECOG Performance Status: 0 - Asymptomatic  Medications:  Allergies as of 03/20/2020   No Known Allergies     Medication List       Accurate as of March 20, 2020  9:55 AM. If you have any questions, ask your nurse or doctor.        albuterol 108 (90 Base) MCG/ACT inhaler Commonly known as: Ventolin HFA Inhale 1-2 puffs into the lungs every 6 (six) hours as needed for wheezing or shortness of breath.   cephALEXin 500 MG capsule Commonly known as: KEFLEX Take 1 capsule (500 mg total) by mouth 3 (three) times daily.   cetirizine 10 MG tablet Commonly known as: ZYRTEC Take 1 tablet (10 mg total) by mouth at bedtime. Use as needed for allergies   norethindrone 0.35 MG tablet Commonly known as: MICRONOR Take 1 tablet by mouth daily.   venlafaxine XR 150 MG 24 hr capsule Commonly known as: EFFEXOR-XR TAKE 1  CAPSULE BY MOUTH EVERY DAY   VITAMIN C PO Take 1 tablet by mouth at bedtime.   VITAMIN D PO Take 1 tablet by mouth at bedtime.   Xarelto 20 MG Tabs tablet Generic drug: rivaroxaban TAKE 1 TABLET BY MOUTH EVERY DAY       Allergies: No Known Allergies  Past Medical History, Surgical history, Social history, and Family History were reviewed and updated.  Review of Systems: All other 10 point review of systems is negative.   Physical Exam:  vitals were not taken for this visit.   Wt Readings from Last 3 Encounters:  11/19/19 229 lb 6.4 oz (104.1 kg)  08/19/19 234 lb (106.1 kg)  08/19/19 233 lb (105.7 kg)    Ocular: Sclerae unicteric, pupils equal, round and reactive to light Ear-nose-throat: Oropharynx clear, dentition fair Lymphatic: No cervical or supraclavicular adenopathy Lungs no rales or rhonchi, good excursion bilaterally Heart regular rate and rhythm, no murmur appreciated Abd soft, nontender, positive bowel sounds MSK no focal spinal tenderness, no joint edema Neuro: non-focal, well-oriented, appropriate affect Breasts: Deferred   Lab Results  Component Value Date   WBC 7.0 03/20/2020   HGB 14.9 03/20/2020   HCT 44.3 03/20/2020   MCV 86.7 03/20/2020   PLT 269 03/20/2020   Lab Results  Component Value Date   FERRITIN 217 06/18/2019   Lab Results  Component Value Date   RETICCTPCT 1.7 06/18/2019  RBC 5.11 03/20/2020   No results found for: KPAFRELGTCHN, LAMBDASER, KAPLAMBRATIO No results found for: IGGSERUM, IGA, IGMSERUM No results found for: Ronnald Ramp, A1GS, A2GS, Tillman Sers, SPEI   Chemistry      Component Value Date/Time   NA 141 11/19/2019 0845   K 3.9 11/19/2019 0845   CL 105 11/19/2019 0845   CO2 30 11/19/2019 0845   BUN 10 11/19/2019 0845   CREATININE 0.73 11/19/2019 0845      Component Value Date/Time   CALCIUM 9.6 11/19/2019 0845   ALKPHOS 81 11/19/2019 0845   AST 19 11/19/2019 0845   ALT 27  11/19/2019 0845   BILITOT 0.4 11/19/2019 0845       Impression and Plan: [name] is a very pleasant 19 yo caucasian female with history of bilateral pulmonary emboli diagnosed along with Covid the same day. She was lupus anticoagulant positive and had mildly elevated anticardiolipin IgM (30).  She is doing well on Xarelto and will continue her same regimen for 1 full year (finishes in December 2021). In December she will transition to maintenance dosing (10 mg PO daily) for one full year.  She verbalized understanding and agreement with the plan.  Follow-up in 4 months.  She can contact our office with any questions or concerns.   Laverna Peace, NP 10/1/20219:55 AM

## 2020-03-20 NOTE — Telephone Encounter (Signed)
Appointments scheduled patient has My Chart Access for appointments per 10/1 los

## 2020-03-22 LAB — CARDIOLIPIN ANTIBODIES, IGG, IGM, IGA
Anticardiolipin IgA: <9 APL U/mL (ref 0–11)
Anticardiolipin IgG: <9 GPL U/mL (ref 0–14)
Anticardiolipin IgM: 15 MPL U/mL — ABNORMAL HIGH (ref 0–12)

## 2020-03-22 LAB — LUPUS ANTICOAGULANT PANEL
DRVVT: 81.2 s — ABNORMAL HIGH (ref 0.0–47.0)
PTT Lupus Anticoagulant: 33.1 s (ref 0.0–51.9)

## 2020-03-22 LAB — DRVVT MIX: dRVVT Mix: 53.4 s — ABNORMAL HIGH (ref 0.0–40.4)

## 2020-03-22 LAB — DRVVT CONFIRM: dRVVT Confirm: 1.8 ratio — ABNORMAL HIGH (ref 0.8–1.2)

## 2020-05-01 ENCOUNTER — Encounter: Payer: Self-pay | Admitting: Family Medicine

## 2020-05-01 NOTE — Telephone Encounter (Signed)
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 She has no regained any of her voice back. She has a a dry cough but mainly when she try's to speak. No fever. She has been fully vaccinated against covid and the flu. She did test positive for covid herself in December. We had no open appointments to offer her due to only having two providers in office.

## 2020-06-08 ENCOUNTER — Other Ambulatory Visit: Payer: Self-pay | Admitting: Family

## 2020-06-08 ENCOUNTER — Telehealth: Payer: Self-pay

## 2020-06-08 ENCOUNTER — Other Ambulatory Visit: Payer: Self-pay | Admitting: *Deleted

## 2020-06-08 ENCOUNTER — Telehealth: Payer: Self-pay | Admitting: *Deleted

## 2020-06-08 DIAGNOSIS — I2699 Other pulmonary embolism without acute cor pulmonale: Secondary | ICD-10-CM

## 2020-06-08 MED ORDER — RIVAROXABAN 10 MG PO TABS: 10.0000 mg | ORAL_TABLET | Freq: Every day | ORAL | 2 refills | Status: DC

## 2020-06-08 NOTE — Progress Notes (Signed)
[place] at [facility] [address] [place], [place] [address] [phone]  Date:  06/15/2020   Name:  Kayla Hayes   DOB:  Oct 22, 2000   MRN:  [id number]  PCP:  Darreld Mclean, MD    Chief Complaint: Back Pain (Upper neck/back pain radiating to lower back, no known injury, hip pain)   History of Present Illness:  Kayla Hayes is a 19 y.o. 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 Marland Kitchen  She is on xarelto for 2 years total- may be able to discontinue after that time.  Seen by hematology/oncology  Last seen by myself in March for a CPE Here today with concern of MSK pain  Hep C and HIV screening covid 19 vaccine- done, will get booster tomorrow  Flu vaccine - done   Today she notes lower back and bilateral hip pain- she has noted this for 6 months. The right hip is worse Also, her neck and bilateral shoulders have bothered her for a year or so Standing up all day may worsen it.  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 Bilateral pulmonary embolism ([place]) 05/24/2019  . COVID-19 virus infection 05/24/2019  . Thrombocytopenia ([place]) 05/24/2019  . Anxiety 11/29/2018  . Asthma 08/08/2012    Past  Medical History:  Diagnosis Date  . Anxiety   . Asthma     No past surgical history on file.  Social History   Tobacco Use  . Smoking status: Never Smoker  . Smokeless tobacco: Never Used  Vaping Use  . Vaping Use: Never used  Substance Use Topics  . Alcohol use: No  . Drug use: No    Family History  Problem Relation Age of Onset  . Heart disease Paternal Grandfather     No Known Allergies  Medication list has been reviewed and updated.  Current Outpatient Medications on File Prior to Visit  Medication Sig Dispense Refill  . albuterol (VENTOLIN HFA) 108 (90 Base) MCG/ACT inhaler Inhale 1-2 puffs into the lungs every 6 (six) hours as needed for wheezing or shortness of breath. 18 g 3  . Ascorbic Acid (VITAMIN C PO) Take 1 tablet by mouth at bedtime.    . cetirizine (ZYRTEC) 10 MG tablet Take 1 tablet (10 mg total) by mouth at bedtime. Use as needed for allergies 30 tablet 11  . fluticasone (FLONASE) 50 MCG/ACT nasal spray fluticasone propionate 50 mcg/actuation nasal spray,suspension  Spray 1 spray twice a day by intranasal route as directed for 10 days.    Marland Kitchen levonorgestrel (KYLEENA) 19.5 MG IUD by Intrauterine route once.    Marland Kitchen  montelukast (SINGULAIR) 10 MG tablet Take 25 mg by mouth at bedtime.    . rivaroxaban (XARELTO) 10 MG TABS tablet Take 1 tablet (10 mg total) by mouth daily. 90 tablet 2  . venlafaxine XR (EFFEXOR-XR) 150 MG 24 hr capsule TAKE 1 CAPSULE BY MOUTH EVERY DAY 90 capsule 4  . VITAMIN D PO Take 1 tablet by mouth at bedtime.     No current facility-administered medications on file prior to visit.    Review of Systems:  As per HPI- otherwise negative.   Physical Examination: Vitals:   06/15/20 0941 06/15/20 1005  BP: 128/80   Pulse: (!) 124 (!) 110  Resp: 17   SpO2: 98%    Vitals:   06/15/20 0941  Weight: 254 lb (115.2 kg)  Height: 5\' 7"  (1.702 m)   Body mass index is 39.78 kg/m. Ideal Body Weight: Weight in (lb) to have BMI = 25:  159.3  GEN: no acute distress.  Obese, otherwise looks well HEENT: Atraumatic, Normocephalic.  Ears and Nose: No external deformity. CV: RRR, No M/G/R. No JVD. No thrill. No extra heart sounds. PULM: CTA B, no wheezes, crackles, rhonchi. No retractions. No resp. distress. No accessory muscle use. ABD: S, NT, ND, +BS. No rebound. No HSM. EXTR: No c/c/e PSYCH: Normally interactive. Conversant.  Examination of both upper and lower extremities seems normal, normal strength and deep tendon reflex.  She has tenderness in the bilateral trapezius muscles-this is the shoulder pain she has noticed There is perhaps mild thoracolumbar scoliosis apparent on exam, otherwise spinal exam is normal-normal thoracolumbar flexion and extension Negative straight leg raise bilaterally  Patient tends to have tachycardia-no symptoms  Pulse Readings from Last 3 Encounters:  06/15/20 (!) 110  03/20/20 (!) 108  11/19/19 90    Assessment and Plan: Chronic bilateral low back pain without sciatica - Plan: DG Lumbar Spine Complete, DG HIPS BILAT W OR W/O PELVIS 2V, methocarbamol (ROBAXIN) 500 MG tablet  Neck pain - Plan: DG Cervical Spine Complete  Scoliosis, unspecified scoliosis type, unspecified spinal region - Plan: DG Lumbar Spine Complete, CANCELED: DG Thoracic Spine 2 View  Lupus anticoagulant disorder ([place]) - Plan: Sedimentation rate, C-reactive protein, ANA, Rheumatoid Factor  Patient here today with concern of pain in her back, neck, and shoulders/trapezius muscles She does have history of scoliosis She also has history of lupus anticoagulant disorder, ?increased possibility of other autoimmune disorder Labs to look for evidence of autoimmune disorder pending as above We will also obtain plain films of her cervical spine, thoracic spine, lumbar spine, and hips Patient is given a prescription for Robaxin to use as needed for symptoms This visit occurred during the SARS-CoV-2 public health emergency.   Safety protocols were in place, including screening questions prior to the visit, additional usage of staff PPE, and extensive cleaning of exam room while observing appropriate contact time as indicated for disinfecting solutions.    Signed Lamar Blinks, MD  Received her x-ray reports as below, message to patient  DG Cervical Spine Complete  Result Date: 06/15/2020 CLINICAL DATA:  No known injury, pain EXAM: CERVICAL SPINE - COMPLETE 4+ VIEW COMPARISON:  None. FINDINGS: Cervical spine is visualized to the level of T1. Vertebral body heights are maintained. Alignment is anatomic. Loss of normal cervical lordosis with mild kyphosis centered at C4-5. prevertebral soft tissues are normal. No acute fracture. Disc spaces are maintained. Neural foramina are patent bilaterally. IMPRESSION: Negative cervical spine radiographs. Electronically Signed   By: Kathreen Devoid  On: 06/15/2020 12:05   DG Thoracic Spine W/Swimmers  Result Date: 06/15/2020 CLINICAL DATA:  Status post fall, hip and neck pain for 6 months EXAM: THORACIC SPINE - 3 VIEWS COMPARISON:  None. FINDINGS: There is no evidence of thoracic spine fracture. No static listhesis. S-shaped curvature of the thoracolumbar spine. No other significant bone abnormalities are identified. IMPRESSION: No acute osseous injury of the thoracic spine. Electronically Signed   By: Kathreen Devoid   On: 06/15/2020 12:05   DG Lumbar Spine Complete  Result Date: 06/15/2020 CLINICAL DATA:  Low back pain, no known injury EXAM: LUMBAR SPINE - COMPLETE 4+ VIEW COMPARISON:  None. FINDINGS: There is no evidence of lumbar spine fracture. No static listhesis. Dextrocurvature of the lumbar spine. Intervertebral disc spaces are maintained. IMPRESSION: No acute osseous injury of the lumbar spine. Electronically Signed   By: Kathreen Devoid   On: 06/15/2020 12:03   DG HIPS BILAT W OR W/O PELVIS 2V  Result Date: 06/15/2020 CLINICAL DATA:  No known injury, pain EXAM: DG HIP  (WITH OR WITHOUT PELVIS) 2V BILAT COMPARISON:  None. FINDINGS: There is no evidence of hip fracture or dislocation. There is no evidence of arthropathy or other focal bone abnormality. T-type IUD in the lower pelvis. IMPRESSION: No acute osseous injury of the bilateral hips. Electronically Signed   By: Kathreen Devoid   On: 06/15/2020 12:02   Results for orders placed or performed in visit on 06/15/20  Sedimentation rate  Result Value Ref Range   Sed Rate 13 0 - 20 mm/hr  C-reactive protein  Result Value Ref Range   CRP <1.0 0.5 - 20.0 mg/dL

## 2020-06-08 NOTE — Telephone Encounter (Signed)
Can you let her know it is time to drop down to maintenance Xarelto 10 mg PO daily!!!!! WOOO HOOO!!!!!! It prompted me to do the 90 day supply with [facility]. $90 for a 3 month supply ($1.00 per day). Let me know if it is cheaper for her to get directly from her normal pharmacy and I can change back if needed. Thank you!    Left message on VM to contact our office for new Xarelto instructions. dph

## 2020-06-08 NOTE — Progress Notes (Signed)
Patient changed from full dose to maintenance dosing Xarelto 10 mg PO daily! She has completed one year of full dose anticoagulation!

## 2020-06-08 NOTE — Telephone Encounter (Signed)
Call back received from patient and patient notified per order of S. Melvin will be decreased to maintenance dose at 10 mg PO daily.  Pt requests that prescription [facility] sent to [facility] [address] and not [facility].  [facility]to prescription [facility] to [facility] [address] and canceled at [facility].  Jory Ee NP notified.

## 2020-06-09 ENCOUNTER — Encounter: Payer: Self-pay | Admitting: Family

## 2020-06-15 ENCOUNTER — Encounter: Payer: Self-pay | Admitting: Family Medicine

## 2020-06-15 ENCOUNTER — Ambulatory Visit
Admission: RE | Admit: 2020-06-15 | Discharge: 2020-06-15 | Disposition: A | Discharge status: Home / Self Care | Payer: 59 | Source: Ambulatory Visit | Attending: Family Medicine | Admitting: Family Medicine

## 2020-06-15 ENCOUNTER — Other Ambulatory Visit: Payer: Self-pay | Admitting: Family Medicine

## 2020-06-15 ENCOUNTER — Ambulatory Visit: Payer: 59 | Admitting: Family Medicine

## 2020-06-15 ENCOUNTER — Other Ambulatory Visit: Payer: Self-pay

## 2020-06-15 VITALS — BP 128/80 | HR 110 | Resp 17 | Ht 67.0 in | Wt 254.0 lb

## 2020-06-15 DIAGNOSIS — D6862 Lupus anticoagulant syndrome: Secondary | ICD-10-CM

## 2020-06-15 DIAGNOSIS — M542 Cervicalgia: Secondary | ICD-10-CM | POA: Diagnosis not present

## 2020-06-15 DIAGNOSIS — M545 Low back pain, unspecified: Secondary | ICD-10-CM

## 2020-06-15 DIAGNOSIS — M419 Scoliosis, unspecified: Secondary | ICD-10-CM | POA: Diagnosis not present

## 2020-06-15 DIAGNOSIS — G8929 Other chronic pain: Secondary | ICD-10-CM

## 2020-06-15 LAB — SEDIMENTATION RATE: Sed Rate: 13 mm/hr (ref 0–20)

## 2020-06-15 LAB — C-REACTIVE PROTEIN: CRP: <1 mg/dL (ref 0.5–20.0)

## 2020-06-15 MED ORDER — METHOCARBAMOL 500 MG PO TABS
500.0000 mg | ORAL_TABLET | Freq: Three times a day (TID) | ORAL | 0 refills | Status: DC | PRN
Start: 2020-06-15 — End: 2024-01-18

## 2020-06-15 NOTE — Patient Instructions (Signed)
Good to see you again today- please go to lab and then to [facility] (at your convenience) to have x-rays taken.  I will be in touch with your reports asap Use robaxin as needed for pain- this may cause you to feel a bit drowsy

## 2020-06-17 LAB — ANA: Anti Nuclear Antibody (ANA): NEGATIVE

## 2020-06-17 LAB — RHEUMATOID FACTOR: Rheumatoid fact SerPl-aCnc: <14 IU/mL (ref <14)

## 2020-06-20 HISTORY — PX: COLONOSCOPY: SHX174

## 2020-07-21 ENCOUNTER — Inpatient Hospital Stay: Payer: 59 | Admitting: Family

## 2020-07-21 ENCOUNTER — Encounter: Payer: Self-pay | Admitting: Family

## 2020-07-21 ENCOUNTER — Inpatient Hospital Stay: Payer: 59 | Attending: Hematology & Oncology

## 2020-07-21 ENCOUNTER — Other Ambulatory Visit: Payer: Self-pay

## 2020-07-21 ENCOUNTER — Telehealth: Payer: Self-pay

## 2020-07-21 VITALS — BP 140/70 | HR 106 | Temp 98.5°F | Resp 18 | Wt 258.5 lb

## 2020-07-21 DIAGNOSIS — Z79899 Other long term (current) drug therapy: Secondary | ICD-10-CM | POA: Diagnosis not present

## 2020-07-21 DIAGNOSIS — I2699 Other pulmonary embolism without acute cor pulmonale: Secondary | ICD-10-CM

## 2020-07-21 DIAGNOSIS — R760 Raised antibody titer: Secondary | ICD-10-CM

## 2020-07-21 DIAGNOSIS — Z7901 Long term (current) use of anticoagulants: Secondary | ICD-10-CM | POA: Diagnosis not present

## 2020-07-21 DIAGNOSIS — Z8616 Personal history of COVID-19: Secondary | ICD-10-CM | POA: Insufficient documentation

## 2020-07-21 DIAGNOSIS — D6859 Other primary thrombophilia: Secondary | ICD-10-CM | POA: Diagnosis not present

## 2020-07-21 DIAGNOSIS — Z86711 Personal history of pulmonary embolism: Secondary | ICD-10-CM | POA: Insufficient documentation

## 2020-07-21 DIAGNOSIS — D6862 Lupus anticoagulant syndrome: Secondary | ICD-10-CM | POA: Diagnosis present

## 2020-07-21 DIAGNOSIS — R768 Other specified abnormal immunological findings in serum: Secondary | ICD-10-CM | POA: Insufficient documentation

## 2020-07-21 LAB — CBC WITH DIFFERENTIAL (CANCER CENTER ONLY)
Abs Immature Granulocytes: 0.02 10*3/uL (ref 0.00–0.07)
Basophils Absolute: 0 10*3/uL (ref 0.0–0.1)
Basophils Relative: 1 %
Eosinophils Absolute: 0 10*3/uL (ref 0.0–0.5)
Eosinophils Relative: 0 %
HCT: 40.3 % (ref 36.0–46.0)
Hemoglobin: 13.7 g/dL (ref 12.0–15.0)
Immature Granulocytes: 0 %
Lymphocytes Relative: 22 %
Lymphs Abs: 1.5 10*3/uL (ref 0.7–4.0)
MCH: 29 pg (ref 26.0–34.0)
MCHC: 34 g/dL (ref 30.0–36.0)
MCV: 85.2 fL (ref 80.0–100.0)
Monocytes Absolute: 0.4 10*3/uL (ref 0.1–1.0)
Monocytes Relative: 7 %
Neutro Abs: 4.7 10*3/uL (ref 1.7–7.7)
Neutrophils Relative %: 70 %
Platelet Count: 262 10*3/uL (ref 150–400)
RBC: 4.73 MIL/uL (ref 3.87–5.11)
RDW: 12.3 % (ref 11.5–15.5)
WBC Count: 6.6 10*3/uL (ref 4.0–10.5)
nRBC: 0 % (ref 0.0–0.2)

## 2020-07-21 LAB — CMP (CANCER CENTER ONLY)
ALT: 18 U/L (ref 0–44)
AST: 15 U/L (ref 15–41)
Albumin: 4.1 g/dL (ref 3.5–5.0)
Alkaline Phosphatase: 68 U/L (ref 38–126)
Anion gap: 8 (ref 5–15)
BUN: 8 mg/dL (ref 6–20)
CO2: 27 mmol/L (ref 22–32)
Calcium: 9.6 mg/dL (ref 8.9–10.3)
Chloride: 103 mmol/L (ref 98–111)
Creatinine: 0.79 mg/dL (ref 0.44–1.00)
GFR, Estimated: >60 mL/min (ref >60)
Glucose, Bld: 111 mg/dL — ABNORMAL HIGH (ref 70–99)
Potassium: 3.7 mmol/L (ref 3.5–5.1)
Sodium: 138 mmol/L (ref 135–145)
Total Bilirubin: 0.4 mg/dL (ref 0.3–1.2)
Total Protein: 6.1 g/dL — ABNORMAL LOW (ref 6.5–8.1)

## 2020-07-21 LAB — D-DIMER, QUANTITATIVE: D-Dimer, Quant: <0.27 ug/mL-FEU (ref 0.00–0.50)

## 2020-07-21 NOTE — Progress Notes (Signed)
Hematology and Oncology Follow Up Visit  Kayla Hayes [id number] 2000-10-26 20 y.o. 07/21/2020   Principle Diagnosis:  Bilateral pulmonary emboli- resolved on CT angio 09/12/2019 Lupus anticoagulant positive Mildly elevated anticardiolipin IgM  Current Therapy: Xarelto 10 mg PO daily   Interim History:  Kayla Hayes is here today for follow-up. She is doing quite well and has no complaints at this time.  She recently had an IUD placed and her cycle was regular with a lighter flow this last month. No other blood loss noted.  She is tolerating maintenance Xarelto nicely. No abnormal bruising, no petechiae.  No fever, chills, n/v, cough, rash, dizziness, SOB, chesty pain, palpitations, abdominal pain or changes in bowel or bladder habits.  No swelling, tenderness, numbness or tingling in her extremities.  No falls or syncope.  She has maintained a good appetite and is staying well hydrated. Her weight is stable.   ECOG Performance Status: 1 - Symptomatic but completely ambulatory  Medications:  Allergies as of 07/21/2020   No Known Allergies     Medication List       Accurate as of July 21, 2020  9:38 AM. If you have any questions, ask your nurse or doctor.        albuterol 108 (90 Base) MCG/ACT inhaler Commonly known as: Ventolin HFA Inhale 1-2 puffs into the lungs every 6 (six) hours as needed for wheezing or shortness of breath.   cetirizine 10 MG tablet Commonly known as: ZYRTEC Take 1 tablet (10 mg total) by mouth at bedtime. Use as needed for allergies   fluticasone 50 MCG/ACT nasal spray Commonly known as: FLONASE fluticasone propionate 50 mcg/actuation nasal spray,suspension  Spray 1 spray twice a day by intranasal route as directed for 10 days.   Kyleena 19.5 MG IUD Generic drug: levonorgestrel by Intrauterine route once.   methocarbamol 500 MG tablet Commonly known as: Robaxin Take 1 tablet (500 mg total) by mouth every 8 (eight) hours as needed  for muscle spasms.   montelukast 10 MG tablet Commonly known as: SINGULAIR Take 25 mg by mouth at bedtime.   rivaroxaban 10 MG Tabs tablet Commonly known as: Xarelto Take 1 tablet (10 mg total) by mouth daily.   venlafaxine XR 150 MG 24 hr capsule Commonly known as: EFFEXOR-XR TAKE 1 CAPSULE BY MOUTH EVERY DAY   VITAMIN C PO Take 1 tablet by mouth at bedtime.   VITAMIN D PO Take 1 tablet by mouth at bedtime.       Allergies: No Known Allergies  Past Medical History, Surgical history, Social history, and Family History were reviewed and updated.  Review of Systems: All other 10 point review of systems is negative.   Physical Exam:  weight is 258 lb 8 oz (117.3 kg). Her oral temperature is 98.5 F (36.9 C). Her blood pressure is 140/70 and her pulse is 106 (abnormal). Her respiration is 18 and oxygen saturation is 100%.   Wt Readings from Last 3 Encounters:  07/21/20 258 lb 8 oz (117.3 kg)  06/15/20 254 lb (115.2 kg)  11/19/19 229 lb 6.4 oz (104.1 kg)    Ocular: Sclerae unicteric, pupils equal, round and reactive to light Ear-nose-throat: Oropharynx clear, dentition fair Lymphatic: No cervical or supraclavicular adenopathy Lungs no rales or rhonchi, good excursion bilaterally Heart regular rate and rhythm, no murmur appreciated Abd soft, nontender, positive bowel sounds MSK no focal spinal tenderness, no joint edema Neuro: non-focal, well-oriented, appropriate affect Breasts: Deferred   Lab Results  Component  Value Date   WBC 6.6 07/21/2020   HGB 13.7 07/21/2020   HCT 40.3 07/21/2020   MCV 85.2 07/21/2020   PLT 262 07/21/2020   Lab Results  Component Value Date   FERRITIN 217 06/18/2019   Lab Results  Component Value Date   RETICCTPCT 1.7 06/18/2019   RBC 4.73 07/21/2020   No results found for: KPAFRELGTCHN, LAMBDASER, KAPLAMBRATIO No results found for: IGGSERUM, IGA, IGMSERUM No results found for: Odetta Pink, SPEI   Chemistry      Component Value Date/Time   NA 141 03/20/2020 0912   K 3.5 03/20/2020 0912   CL 105 03/20/2020 0912   CO2 27 03/20/2020 0912   BUN 11 03/20/2020 0912   CREATININE 0.78 03/20/2020 0912      Component Value Date/Time   CALCIUM 9.9 03/20/2020 0912   ALKPHOS 77 03/20/2020 0912   AST 16 03/20/2020 0912   ALT 22 03/20/2020 0912   BILITOT 0.3 03/20/2020 0912       Impression and Plan: Kayla Hayes is a very pleasant 20 yo caucasian female with history of bilateral pulmonary emboli diagnosed along with Covid the same day. She was lupus anticoagulant positive and had mildly elevated anticardiolipin IgM (30).  She is doing well on maintenance Xarelto 10 mg PO daily and will complete in December 2022.  Lupus anticoagulant pending.  Follow-up in 4 months.  She can contact our office with any questions or concerns.   Laverna Peace, NP 2/1/20229:38 AM

## 2020-07-21 NOTE — Telephone Encounter (Signed)
appts made per verbal los, pt aware that I will call if order/los is changed    Kayla Hayes

## 2020-07-22 LAB — CARDIOLIPIN ANTIBODIES, IGG, IGM, IGA
Anticardiolipin IgA: <9 APL U/mL (ref 0–11)
Anticardiolipin IgG: <9 GPL U/mL (ref 0–14)
Anticardiolipin IgM: <9 MPL U/mL (ref 0–12)

## 2020-07-23 LAB — DRVVT CONFIRM: dRVVT Confirm: 1.7 ratio — ABNORMAL HIGH (ref 0.8–1.2)

## 2020-07-23 LAB — DRVVT MIX: dRVVT Mix: 51.8 s — ABNORMAL HIGH (ref 0.0–40.4)

## 2020-07-23 LAB — LUPUS ANTICOAGULANT PANEL
DRVVT: 78.2 s — ABNORMAL HIGH (ref 0.0–47.0)
PTT Lupus Anticoagulant: 36.8 s (ref 0.0–51.9)

## 2020-08-11 ENCOUNTER — Telehealth: Payer: 59 | Admitting: Physician Assistant

## 2020-08-11 DIAGNOSIS — J019 Acute sinusitis, unspecified: Secondary | ICD-10-CM | POA: Diagnosis not present

## 2020-08-11 MED ORDER — AMOXICILLIN-POT CLAVULANATE 875-125 MG PO TABS: 1.0000 | ORAL_TABLET | Freq: Two times a day (BID) | ORAL | 0 refills | Status: DC

## 2020-08-11 MED ORDER — FLUTICASONE PROPIONATE 50 MCG/ACT NA SUSP: 2.0000 | Freq: Every day | NASAL | 0 refills | Status: DC

## 2020-08-11 NOTE — Progress Notes (Signed)
We are sorry that you are not feeling well.  Here is how we plan to help!  Based on what you have shared with me it looks like you have sinusitis.  I am going to prescribe and antibiotic, but would recommend a COVID test.  Sinusitis is inflammation and infection in the sinus cavities of the head.  Based on your presentation I believe you most likely have Acute Bacterial Sinusitis.  This is an infection caused by bacteria and is treated with antibiotics. I have prescribed Augmentin 875mg /125mg  one tablet twice daily with food, for 7 days. I would also recommend restarting the Flonase. You may use an oral decongestant such as Mucinex D or if you have glaucoma or high blood pressure use plain Mucinex. Saline nasal spray help and can safely be used as often as needed for congestion.  If you develop worsening sinus pain, fever or notice severe headache and vision changes, or if symptoms are not better after completion of antibiotic, please schedule an appointment with a health care provider.    Sinus infections are not as easily transmitted as other respiratory infection, however we still recommend that you avoid close contact with loved ones, especially the very [place] and elderly.  Remember to wash your hands thoroughly throughout the day as this is the number one way to prevent the spread of infection!  Home Care:  Only take medications as instructed by your medical team.  Complete the entire course of an antibiotic.  Do not take these medications with alcohol.  A steam or ultrasonic humidifier can help congestion.  You can place a towel over your head and breathe in the steam from hot water coming from a faucet.  Avoid close contacts especially the very [place] and the elderly.  Cover your mouth when you cough or sneeze.  Always remember to wash your hands.  Get Help Right Away If:  You develop worsening fever or sinus pain.  You develop a severe head ache or visual changes.  Your symptoms  persist after you have completed your treatment plan.  Make sure you  Understand these instructions.  Will watch your condition.  Will get help right away if you are not doing well or get worse.  Your e-visit answers were reviewed by a board certified advanced clinical practitioner to complete your personal care plan.  Depending on the condition, your plan could have included both over the counter or prescription medications.  If there is a problem please reply  once you have received a response from your provider.  Your safety is important to [place].  If you have drug allergies check your prescription carefully.    You can use MyChart to ask questions about today's visit, request a non-urgent call back, or ask for a work or school excuse for 24 hours related to this e-Visit. If it has been greater than 24 hours you will need to follow up with your provider, or enter a new e-Visit to address those concerns.  You will get an e-mail in the next two days asking about your experience.  I hope that your e-visit has been valuable and will speed your recovery. Thank you for using e-visits.   Greater than 5 minutes, yet less than 10 minutes of time have been spent researching, coordinating, and implementing care for this patient today

## 2020-08-16 ENCOUNTER — Encounter: Payer: Self-pay | Admitting: Family Medicine

## 2020-08-16 DIAGNOSIS — B373 Candidiasis of vulva and vagina: Secondary | ICD-10-CM

## 2020-08-16 DIAGNOSIS — B3731 Acute candidiasis of vulva and vagina: Secondary | ICD-10-CM

## 2020-08-17 MED ORDER — FLUCONAZOLE 150 MG PO TABS: 150.0000 mg | ORAL_TABLET | Freq: Once | ORAL | 0 refills | Status: AC

## 2020-08-18 NOTE — Progress Notes (Deleted)
[place] at [facility] [address], [place], [place] [address] [phone]  Date:  08/20/2020   Name:  Kayla Hayes   DOB:  Mar 21, 2001   MRN:  [id number]  PCP:  Darreld Mclean, MD    Chief Complaint: No chief complaint on file.   History of Present Illness:  Kayla Hayes is a 20 y.o. 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 Marland Kitchen  She is on xarelto for 2 years total- may be able to discontinue after that time.  Seen by hematology/oncology  covid series UTD  Seen by oncology last month - per this visit She is doing well on maintenance Xarelto 10 mg PO daily and will complete in December 2022.  Lupus anticoagulant pending.  Follow-up in 4 months.   Hep C vaccine hiv screening Pap HPV vaccine Some labs done in February  Patient Active Problem List   Diagnosis Date Noted  . Bilateral pulmonary embolism ([place]) 05/24/2019  . COVID-19 virus infection 05/24/2019  . Thrombocytopenia ([place]) 05/24/2019  . Anxiety 11/29/2018  . Asthma 08/08/2012    Past Medical History:  Diagnosis Date  . Anxiety   . Asthma     No past surgical history on file.  Social History   Tobacco Use  . Smoking status: Never Smoker  . Smokeless tobacco: Never Used  Vaping Use  . Vaping Use: Never used  Substance Use Topics  . Alcohol use: No  . Drug use: No    Family History  Problem Relation Age of Onset  . Heart disease Paternal Grandfather     No Known Allergies  Medication list has been reviewed and updated.  Current Outpatient Medications on File Prior to Visit  Medication Sig Dispense  Refill  . albuterol (VENTOLIN HFA) 108 (90 Base) MCG/ACT inhaler Inhale 1-2 puffs into the lungs every 6 (six) hours as needed for wheezing or shortness of breath. (Patient not taking: Reported on 07/21/2020) 18 g 3  . amoxicillin-clavulanate (AUGMENTIN) 875-125 MG tablet Take 1 tablet by mouth 2 (two) times daily. One po bid x 7 days 14 tablet 0  . Ascorbic Acid (VITAMIN C PO) Take 1 tablet by mouth at bedtime.    . cetirizine (ZYRTEC) 10 MG tablet Take 1 tablet (10 mg total) by mouth at bedtime. Use as needed for allergies 30 tablet 11  . fluticasone (FLONASE) 50 MCG/ACT nasal spray Place 2 sprays into both nostrils daily. 9.9 g 0  . levonorgestrel (KYLEENA) 19.5 MG IUD by Intrauterine route once. (Patient not taking: Reported on 07/21/2020)    . methocarbamol (ROBAXIN) 500 MG tablet Take 1 tablet (500 mg total) by mouth every 8 (eight) hours as needed for muscle spasms. (Patient not taking: Reported on 07/21/2020) 30 tablet 0  . montelukast (SINGULAIR) 10 MG tablet Take 25 mg by mouth at bedtime.    . rivaroxaban (XARELTO) 10 MG TABS tablet Take 1 tablet (10 mg total) by mouth daily. 90 tablet 2  . venlafaxine XR (EFFEXOR-XR) 150 MG 24 hr capsule TAKE 1 CAPSULE BY MOUTH EVERY DAY 90 capsule 4  . VITAMIN D PO  Take 1 tablet by mouth at bedtime.     No current facility-administered medications on file prior to visit.    Review of Systems:  As per HPI- otherwise negative.   Physical Examination: There were no vitals filed for this visit. There were no vitals filed for this visit. There is no height or weight on file to calculate BMI. Ideal Body Weight:    GEN: no acute distress. HEENT: Atraumatic, Normocephalic.  Ears and Nose: No external deformity. CV: RRR, No M/G/R. No JVD. No thrill. No extra heart sounds. PULM: CTA B, no wheezes, crackles, rhonchi. No retractions. No resp. distress. No accessory muscle use. ABD: S, NT, ND, +BS. No rebound. No HSM. EXTR: No c/c/e PSYCH: Normally  interactive. Conversant.    Assessment and Plan: *** This visit occurred during the SARS-CoV-2 public health emergency.  Safety protocols were in place, including screening questions prior to the visit, additional usage of staff PPE, and extensive cleaning of exam room while observing appropriate contact time as indicated for disinfecting solutions.    Signed Lamar Blinks, MD

## 2020-08-18 NOTE — Patient Instructions (Addendum)
Good to see you again today!    You got your first dose of Gardasil today 2nd dose in 1-2 months, 3rd dose in 6 months   Please go to the ground floor to have a foot x-ray after your viist     Health Maintenance, Female Adopting a healthy lifestyle and getting preventive care are important in promoting health and wellness. Ask your health care provider about:  The right schedule for you to have regular tests and exams.  Things you can do on your own to prevent diseases and keep yourself healthy. What should I know about diet, weight, and exercise? Eat a healthy diet  Eat a diet that includes plenty of vegetables, fruits, low-fat dairy products, and lean protein.  Do not eat a lot of foods that are high in solid fats, added sugars, or sodium.   Maintain a healthy weight Body mass index (BMI) is used to identify weight problems. It estimates body fat based on height and weight. Your health care provider can help determine your BMI and help you achieve or maintain a healthy weight. Get regular exercise Get regular exercise. This is one of the most important things you can do for your health. Most adults should:  Exercise for at least 150 minutes each week. The exercise should increase your heart rate and make you sweat (moderate-intensity exercise).  Do strengthening exercises at least twice a week. This is in addition to the moderate-intensity exercise.  Spend less time sitting. Even light physical activity can be beneficial. Watch cholesterol and blood lipids Have your blood tested for lipids and cholesterol at 21 years of age, then have this test every 5 years. Have your cholesterol levels checked more often if:  Your lipid or cholesterol levels are high.  You are older than 21 years of age.  You are at high risk for heart disease. What should I know about cancer screening? Depending on your health history and family history, you may need to have cancer screening at various  ages. This may include screening for:  Breast cancer.  Cervical cancer.  Colorectal cancer.  Skin cancer.  Lung cancer. What should I know about heart disease, diabetes, and high blood pressure? Blood pressure and heart disease  High blood pressure causes heart disease and increases the risk of stroke. This is more likely to develop in people who have high blood pressure readings, are of African descent, or are overweight.  Have your blood pressure checked: ? Every 3-5 years if you are 28-54 years of age. ? Every year if you are 62 years old or older. Diabetes Have regular diabetes screenings. This checks your fasting blood sugar level. Have the screening done:  Once every three years after age 14 if you are at a normal weight and have a low risk for diabetes.  More often and at a younger age if you are overweight or have a high risk for diabetes. What should I know about preventing infection? Hepatitis B If you have a higher risk for hepatitis B, you should be screened for this virus. Talk with your health care provider to find out if you are at risk for hepatitis B infection. Hepatitis C Testing is recommended for:  Everyone born from [birth year] through 1966.  Anyone with known risk factors for hepatitis C. Sexually transmitted infections (STIs)  Get screened for STIs, including gonorrhea and chlamydia, if: ? You are sexually active and are younger than 21 years of age. ? You are older than  21 years of age and your health care provider tells you that you are at risk for this type of infection. ? Your sexual activity has changed since you were last screened, and you are at increased risk for chlamydia or gonorrhea. Ask your health care provider if you are at risk.  Ask your health care provider about whether you are at high risk for HIV. Your health care provider may recommend a prescription medicine to help prevent HIV infection. If you choose to take medicine to prevent HIV, you  should first get tested for HIV. You should then be tested every 3 months for as long as you are taking the medicine. Pregnancy  If you are about to stop having your period (premenopausal) and you may become pregnant, seek counseling before you get pregnant.  Take 400 to 800 micrograms (mcg) of folic acid every day if you become pregnant.  Ask for birth control (contraception) if you want to prevent pregnancy. Osteoporosis and menopause Osteoporosis is a disease in which the bones lose minerals and strength with aging. This can result in bone fractures. If you are 95 years old or older, or if you are at risk for osteoporosis and fractures, ask your health care provider if you should:  Be screened for bone loss.  Take a calcium or vitamin D supplement to lower your risk of fractures.  Be given hormone replacement therapy (HRT) to treat symptoms of menopause. Follow these instructions at home: Lifestyle  Do not use any products that contain nicotine or tobacco, such as cigarettes, e-cigarettes, and chewing tobacco. If you need help quitting, ask your health care provider.  Do not use street drugs.  Do not share needles.  Ask your health care provider for help if you need support or information about quitting drugs. Alcohol use  Do not drink alcohol if: ? Your health care provider tells you not to drink. ? You are pregnant, may be pregnant, or are planning to become pregnant.  If you drink alcohol: ? Limit how much you use to 0-1 drink a day. ? Limit intake if you are breastfeeding.  Be aware of how much alcohol is in your drink. In the [place] one drink equals one 12 oz bottle of beer (355 mL), one 5 oz glass of wine (148 mL), or one 1 oz glass of hard liquor (44 mL). General instructions  Schedule regular health, dental, and eye exams.  Stay current with your vaccines.  Tell your health care provider if: ? You often feel depressed. ? You have ever been abused or do not feel  safe at home. Summary  Adopting a healthy lifestyle and getting preventive care are important in promoting health and wellness.  Follow your health care provider's instructions about healthy diet, exercising, and getting tested or screened for diseases.  Follow your health care provider's instructions on monitoring your cholesterol and blood pressure. This information is not intended to replace advice given to you by your health care provider. Make sure you discuss any questions you have with your health care provider. Document Revised: 05/30/2018 Document Reviewed: 05/30/2018 Elsevier Patient Education  2021 [employer].

## 2020-08-19 NOTE — Progress Notes (Signed)
[facility] [address] [place], [place] [address] [phone]  Date:  08/20/2020   Name:  Kayla Hayes   DOB:  [birth year]   MRN:  [id number]  PCP:  Darreld Mclean, MD    Chief Complaint: Annual Exam   History of Present Illness:  Kayla Hayes is a 20 y.o. very pleasant female patient who presents with the following:  Here today for a CPE Last seen by myself in December 2021 Pt with history of bilateral PE diagnosed December 2020-recent covid 19 infection, lupus anticoagulant positive and had mildly elevated anticardiolipin IgM.  She is on xarelto for 2 years total- may be able to discontinue after that time.  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 She notes it was abnormal and is having colposcopy on Tuesday  With spent some time today discussing colposcopy and abnormal Pap management.  She would like to begin Gardasil series today  She did an evisit on 222 for sinusitis She notes pain in her right foot only for about a week NKI  No redness or swelling.  She has not significantly changed her activity level recently.  She tends to wear Converse type shoes  LMP 2/13 She is SA  Patient Active Problem List   Diagnosis Date Noted  . Bilateral pulmonary embolism ([place]) 05/24/2019  . COVID-19 virus infection 05/24/2019  . Thrombocytopenia ([place]) 05/24/2019  . Anxiety 11/29/2018  . Asthma 08/08/2012    Past Medical History:  Diagnosis Date  . Anxiety   . Asthma     History reviewed. No pertinent surgical history.  Social History   Tobacco Use  . Smoking status: Never Smoker  . Smokeless  tobacco: Never Used  Vaping Use  . Vaping Use: Never used  Substance Use Topics  . Alcohol use: No  . Drug use: No    Family History  Problem Relation Age of Onset  . Heart disease Paternal Grandfather     No Known Allergies  Medication list has been reviewed and updated.  Current Outpatient Medications on File Prior to Visit  Medication Sig Dispense Refill  . amoxicillin-clavulanate (AUGMENTIN) 875-125 MG tablet Take 1 tablet by mouth 2 (two) times daily. One po bid x 7 days 14 tablet 0  . Ascorbic Acid (VITAMIN C PO) Take 1 tablet by mouth at bedtime.    . cetirizine (ZYRTEC) 10 MG tablet Take 1 tablet (10 mg total) by mouth at bedtime. Use as needed for allergies 30 tablet 11  . fluticasone (FLONASE) 50 MCG/ACT nasal spray Place 2 sprays into both nostrils daily. 9.9 g 0  . levonorgestrel (KYLEENA) 19.5 MG IUD by Intrauterine route once.    . methocarbamol (ROBAXIN) 500 MG tablet Take 1 tablet (500 mg total) by mouth every 8 (eight) hours as needed for muscle spasms. 30 tablet 0  . montelukast (SINGULAIR) 10 MG tablet Take 25 mg by mouth at bedtime.    . rivaroxaban (XARELTO) 10 MG TABS tablet Take 1 tablet (10 mg total) by mouth daily. 90 tablet 2  . venlafaxine XR (EFFEXOR-XR) 150 MG 24  hr capsule TAKE 1 CAPSULE BY MOUTH EVERY DAY 90 capsule 4  . VITAMIN D PO Take 1 tablet by mouth at bedtime.     No current facility-administered medications on file prior to visit.    Review of Systems:  As per HPI- otherwise negative.   Physical Examination: Vitals:   08/20/20 0817  BP: 122/82  Pulse: (!) 101  Resp: 17  SpO2: 98%   Vitals:   08/20/20 0817  Weight: 261 lb (118.4 kg)  Height: 5\' 7"  (1.702 m)   Body mass index is 40.88 kg/m. Ideal Body Weight: Weight in (lb) to have BMI = 25: 159.3  GEN: no acute distress.  Obese, otherwise looks well HEENT: Atraumatic, Normocephalic.   Bilateral TM wnl, oropharynx normal.  PEERL,EOMI.   Ears and Nose: No external  deformity. CV: RRR, No M/G/R. No JVD. No thrill. No extra heart sounds. PULM: CTA B, no wheezes, crackles, rhonchi. No retractions. No resp. distress. No accessory muscle use. ABD: S, NT, ND, +BS. No rebound. No HSM. EXTR: No c/c/e PSYCH: Normally interactive. Conversant.  Right foot : She has mild tenderness over the lateral dorsum of the foot.  Seems most concentrated over the third and fourth metatarsals.  Plantar fascia is negative.  No swelling, heat or redness   Assessment and Plan: Physical exam  Right foot pain - Plan: DG Foot Complete Right  Immunization due - Plan: HPV 9-valent vaccine,Recombinat  Patient today for physical exam Offered blood work today such as cholesterol and thyroid.  Patient declines at this time Pap is done per GYN, we discussed colposcopy Start Gardasil series We will have her get a plain x-ray of her foot to start evaluation of foot pain.  Consider possibility of stress fracture  This visit occurred during the SARS-CoV-2 public health emergency.  Safety protocols were in place, including screening questions prior to the visit, additional usage of staff PPE, and extensive cleaning of exam room while observing appropriate contact time as indicated for disinfecting solutions.    Signed Lamar Blinks, MD

## 2020-08-20 ENCOUNTER — Other Ambulatory Visit: Payer: Self-pay

## 2020-08-20 ENCOUNTER — Ambulatory Visit (HOSPITAL_BASED_OUTPATIENT_CLINIC_OR_DEPARTMENT_OTHER)
Admission: RE | Admit: 2020-08-20 | Discharge: 2020-08-20 | Disposition: A | Discharge status: Home / Self Care | Payer: 59 | Source: Ambulatory Visit | Attending: Family Medicine | Admitting: Family Medicine

## 2020-08-20 ENCOUNTER — Ambulatory Visit (INDEPENDENT_AMBULATORY_CARE_PROVIDER_SITE_OTHER): Payer: 59 | Admitting: Family Medicine

## 2020-08-20 ENCOUNTER — Encounter: Payer: Self-pay | Admitting: Family Medicine

## 2020-08-20 VITALS — BP 122/82 | HR 101 | Resp 17 | Ht 67.0 in | Wt 261.0 lb

## 2020-08-20 DIAGNOSIS — M79671 Pain in right foot: Secondary | ICD-10-CM | POA: Insufficient documentation

## 2020-08-20 DIAGNOSIS — Z0000 Encounter for general adult medical examination without abnormal findings: Secondary | ICD-10-CM | POA: Diagnosis not present

## 2020-08-20 DIAGNOSIS — Z23 Encounter for immunization: Secondary | ICD-10-CM | POA: Diagnosis not present

## 2020-08-20 DIAGNOSIS — Z5329 Procedure and treatment not carried out because of patient's decision for other reasons: Secondary | ICD-10-CM

## 2020-08-21 ENCOUNTER — Encounter: Payer: Self-pay | Admitting: Family Medicine

## 2020-08-21 DIAGNOSIS — M79671 Pain in right foot: Secondary | ICD-10-CM

## 2020-08-25 ENCOUNTER — Other Ambulatory Visit: Payer: Self-pay | Admitting: Obstetrics and Gynecology

## 2020-08-25 ENCOUNTER — Ambulatory Visit: Payer: 59 | Admitting: Family

## 2020-08-25 DIAGNOSIS — M79671 Pain in right foot: Secondary | ICD-10-CM | POA: Diagnosis not present

## 2020-08-26 NOTE — Progress Notes (Signed)
Office Visit Note   Patient: Kayla Hayes           Date of Birth: 2000-10-11           MRN: [id number] Visit Date: 08/25/2020              Requested by: Darreld Mclean, MD [address] [place],  [place] [id number] PCP: Darreld Mclean, MD  Chief Complaint  Patient presents with  . Right Foot - Pain      HPI: The patient is a 20 year old woman who presents today with a 3-week history of right foot pain.  She does not have associated injury she has not changed her activities has not increased her exercise.  She has pain over the center of the dorsum of her foot she is complaining of some Achilles tightness increased pain with weightbearing the pain gradually increases over the course of the day.  She is not sure she has had any swelling she has been using Tylenol with minimal relief  Did see her primary care for the same radiographs were performed on March 3 these were negative for fracture  Assessment & Plan: Visit Diagnoses: No diagnosis found.  Plan: We will place her in a cam walker for protected weightbearing.  We will treat her as if she has a stress fracture plan to follow-up in 2 weeks with repeat radiographs  Follow-Up Instructions: Return in about 2 weeks (around 09/08/2020).   Right Ankle Exam  Right ankle exam is normal.      Patient is alert, oriented, no adenopathy, well-dressed, normal affect, normal respiratory effort. On examination of her right foot the foot is plantigrade there is no edema no ecchymosis she does have tenderness over the length of her second third and fourth metatarsals dorsally  Imaging: No results found. No images are attached to the encounter.  Labs: Lab Results  Component Value Date   ESRSEDRATE 13 06/15/2020   CRP <1.0 06/15/2020   REPTSTATUS 08/14/2009 FINAL 08/13/2009   CULT NO GROWTH 08/13/2009     Lab Results  Component Value Date   ALBUMIN 4.1 07/21/2020   ALBUMIN 4.5 03/20/2020   ALBUMIN 4.5  11/19/2019    No results found for: MG No results found for: VD25OH  No results found for: PREALBUMIN CBC EXTENDED Latest Ref Rng & Units 07/21/2020 03/20/2020 11/19/2019  WBC 4.0 - 10.5 K/uL 6.6 7.0 6.0  RBC 3.87 - 5.11 MIL/uL 4.73 5.11 4.96  HGB 12.0 - 15.0 g/dL 13.7 14.9 14.4  HCT 36.0 - 46.0 % 40.3 44.3 42.9  PLT 150 - 400 K/uL 262 269 249  NEUTROABS 1.7 - 7.7 K/uL 4.7 4.7 4.1  LYMPHSABS 0.7 - 4.0 K/uL 1.5 1.7 1.5     There is no height or weight on file to calculate BMI.  Orders:  No orders of the defined types were placed in this encounter.  No orders of the defined types were placed in this encounter.    Procedures: No procedures performed  Clinical Data: No additional findings.  ROS:  All other systems negative, except as noted in the HPI. Review of Systems  Constitutional: Negative for chills and fever.  Musculoskeletal: Positive for arthralgias and myalgias.    Objective: Vital Signs: LMP 08/02/2020   Specialty Comments:  No specialty comments available.  PMFS History: Patient Active Problem List   Diagnosis Date Noted  . Bilateral pulmonary embolism ([place]) 05/24/2019  . COVID-19 virus infection 05/24/2019  .  Thrombocytopenia ([place]) 05/24/2019  . Anxiety 11/29/2018  . Asthma 08/08/2012   Past Medical History:  Diagnosis Date  . Anxiety   . Asthma     Family History  Problem Relation Age of Onset  . Heart disease Paternal Grandfather     No past surgical history on file. Social History   Occupational History  . Not on file  Tobacco Use  . Smoking status: Never Smoker  . Smokeless tobacco: Never Used  Vaping Use  . Vaping Use: Never used  Substance and Sexual Activity  . Alcohol use: No  . Drug use: No  . Sexual activity: Not on file

## 2020-09-04 ENCOUNTER — Encounter: Payer: Self-pay | Admitting: Family Medicine

## 2020-09-08 ENCOUNTER — Ambulatory Visit: Payer: Self-pay

## 2020-09-08 ENCOUNTER — Encounter: Payer: Self-pay | Admitting: Orthopedic Surgery

## 2020-09-08 ENCOUNTER — Ambulatory Visit: Payer: 59 | Admitting: Orthopedic Surgery

## 2020-09-08 DIAGNOSIS — G5761 Lesion of plantar nerve, right lower limb: Secondary | ICD-10-CM

## 2020-09-08 DIAGNOSIS — M79671 Pain in right foot: Secondary | ICD-10-CM | POA: Diagnosis not present

## 2020-09-08 MED ORDER — METHYLPREDNISOLONE ACETATE 40 MG/ML IJ SUSP
40.0000 mg | INTRAMUSCULAR | Status: AC | PRN
  Administered 2020-09-08: 40 mg via INTRA_ARTICULAR

## 2020-09-08 MED ORDER — LIDOCAINE HCL 1 % IJ SOLN
1.0000 mL | INTRAMUSCULAR | Status: AC | PRN
  Administered 2020-09-08: 1 mL

## 2020-09-08 NOTE — Progress Notes (Signed)
Office Visit Note   Patient: Kayla Hayes           Date of Birth: [birth year]           MRN: [id number] Visit Date: 09/08/2020              Requested by: Darreld Mclean, MD [address] [place],  [place] [address] PCP: Darreld Mclean, MD  Chief Complaint  Patient presents with  . Right Foot - Follow-up      HPI: Patient is a 20 year old woman who presents in follow-up for right foot pain.  She states the pain starts from around the third and fourth metatarsal head and radiates proximally.  She was initially placed in a fracture boot thinking that she may have a stress fracture.  Patient denies any injury states that she still has persistent pain.  Assessment & Plan: Visit Diagnoses:  1. Pain in right foot   2. Morton's neuroma of right foot     Plan: The Morton's neuroma was injected she tolerated this well she will resume a wide sneakers follow-up in 3 weeks  Follow-Up Instructions: Return in about 3 weeks (around 09/29/2020).   Ortho Exam  Patient is alert, oriented, no adenopathy, well-dressed, normal affect, normal respiratory effort. Examination patient has good pulses good ankle good subtalar motion.  She has tenderness to palpation over the third and fourth metatarsal heads but is most tender to palpation of the third webspace between the third and fourth metatarsal heads.  Lateral compression is uncomfortable a palpable clunk can be felt.  Imaging: XR Foot Complete Right  Result Date: 09/08/2020 Three-view radiographs of the right foot shows no stress fractures no joint space irregularity.  No images are attached to the encounter.  Labs: Lab Results  Component Value Date   ESRSEDRATE 13 06/15/2020   CRP <1.0 06/15/2020   REPTSTATUS 08/14/2009 FINAL 08/13/2009   CULT NO GROWTH 08/13/2009     Lab Results  Component Value Date   ALBUMIN 4.1 07/21/2020   ALBUMIN 4.5 03/20/2020   ALBUMIN 4.5 11/19/2019    No results found for:  MG No results found for: VD25OH  No results found for: PREALBUMIN CBC EXTENDED Latest Ref Rng & Units 07/21/2020 03/20/2020 11/19/2019  WBC 4.0 - 10.5 K/uL 6.6 7.0 6.0  RBC 3.87 - 5.11 MIL/uL 4.73 5.11 4.96  HGB 12.0 - 15.0 g/dL 13.7 14.9 14.4  HCT 36.0 - 46.0 % 40.3 44.3 42.9  PLT 150 - 400 K/uL 262 269 249  NEUTROABS 1.7 - 7.7 K/uL 4.7 4.7 4.1  LYMPHSABS 0.7 - 4.0 K/uL 1.5 1.7 1.5     There is no height or weight on file to calculate BMI.  Orders:  Orders Placed This Encounter  Procedures  . XR Foot Complete Right   No orders of the defined types were placed in this encounter.    Procedures: Small Joint Inj: R intertarsal on 09/08/2020 2:10 PM Indications: pain and diagnostic evaluation Details: 22 G needle  Spinal Needle: No  Medications: 1 mL lidocaine 1 %; 40 mg methylPREDNISolone acetate 40 MG/ML Outcome: tolerated well, no immediate complications Procedure, treatment alternatives, risks and benefits explained, specific risks discussed. Consent was given by the patient. Immediately prior to procedure a time out was called to verify the correct patient, procedure, equipment, support staff and site/side marked as required. Patient was prepped and draped in the usual sterile fashion.      Clinical Data: No additional  findings.  ROS:  All other systems negative, except as noted in the HPI. Review of Systems  Objective: Vital Signs: There were no vitals taken for this visit.  Specialty Comments:  No specialty comments available.  PMFS History: Patient Active Problem List   Diagnosis Date Noted  . Bilateral pulmonary embolism ([place]) 05/24/2019  . COVID-19 virus infection 05/24/2019  . Thrombocytopenia ([place]) 05/24/2019  . Anxiety 11/29/2018  . Asthma 08/08/2012   Past Medical History:  Diagnosis Date  . Anxiety   . Asthma     Family History  Problem Relation Age of Onset  . Heart disease Paternal Grandfather     No past surgical history on file. Social  History   Occupational History  . Not on file  Tobacco Use  . Smoking status: Never Smoker  . Smokeless tobacco: Never Used  Vaping Use  . Vaping Use: Never used  Substance and Sexual Activity  . Alcohol use: No  . Drug use: No  . Sexual activity: Not on file

## 2020-09-11 ENCOUNTER — Telehealth: Payer: Self-pay

## 2020-09-11 NOTE — Telephone Encounter (Signed)
S/w pt and r/s her appt due to sarah meeting 11/24/20, aware of new appt    Kayla Hayes

## 2020-09-29 ENCOUNTER — Encounter: Payer: Self-pay | Admitting: Orthopedic Surgery

## 2020-09-29 ENCOUNTER — Ambulatory Visit (INDEPENDENT_AMBULATORY_CARE_PROVIDER_SITE_OTHER): Payer: 59 | Admitting: Orthopedic Surgery

## 2020-09-29 DIAGNOSIS — G5761 Lesion of plantar nerve, right lower limb: Secondary | ICD-10-CM | POA: Diagnosis not present

## 2020-09-29 NOTE — Progress Notes (Signed)
Office Visit Note   Patient: Kayla Hayes           Date of Birth: [birth year]           MRN: [id number] Visit Date: 09/29/2020              Requested by: Darreld Mclean, MD [address] [place],  [place] [address] PCP: Darreld Mclean, MD  Chief Complaint  Patient presents with  . Right Foot - Follow-up    S/p morton's neuroma injection 09/08/20      HPI: Patient is a 20 year old woman who presents in follow-up status post Morton's neuroma injection right foot third webspace.  Patient states that it worked well but not 100% she feels like she is much better.  Assessment & Plan: Visit Diagnoses:  1. Morton's neuroma of right foot     Plan: Recommended a wide shoe recommended Achilles stretching.  Discussed that if she has recurrent symptoms we could proceed with an injection and worse case scenario we could excise the neuroma.  Follow-Up Instructions: Return if symptoms worsen or fail to improve.   Ortho Exam  Patient is alert, oriented, no adenopathy, well-dressed, normal affect, normal respiratory effort. Examination patient has a good dorsalis pedis pulse she has dorsiflexion to neutral with her knee extended.  She has minimal tenderness to palpation across the metatarsal heads and webspace.  There is no redness no cellulitis no signs of infection.  Imaging: No results found. No images are attached to the encounter.  Labs: Lab Results  Component Value Date   ESRSEDRATE 13 06/15/2020   CRP <1.0 06/15/2020   REPTSTATUS 08/14/2009 FINAL 08/13/2009   CULT NO GROWTH 08/13/2009     Lab Results  Component Value Date   ALBUMIN 4.1 07/21/2020   ALBUMIN 4.5 03/20/2020   ALBUMIN 4.5 11/19/2019    No results found for: MG No results found for: VD25OH  No results found for: PREALBUMIN CBC EXTENDED Latest Ref Rng & Units 07/21/2020 03/20/2020 11/19/2019  WBC 4.0 - 10.5 K/uL 6.6 7.0 6.0  RBC 3.87 - 5.11 MIL/uL 4.73 5.11 4.96  HGB 12.0 - 15.0 g/dL  13.7 14.9 14.4  HCT 36.0 - 46.0 % 40.3 44.3 42.9  PLT 150 - 400 K/uL 262 269 249  NEUTROABS 1.7 - 7.7 K/uL 4.7 4.7 4.1  LYMPHSABS 0.7 - 4.0 K/uL 1.5 1.7 1.5     There is no height or weight on file to calculate BMI.  Orders:  No orders of the defined types were placed in this encounter.  No orders of the defined types were placed in this encounter.    Procedures: No procedures performed  Clinical Data: No additional findings.  ROS:  All other systems negative, except as noted in the HPI. Review of Systems  Objective: Vital Signs: There were no vitals taken for this visit.  Specialty Comments:  No specialty comments available.  PMFS History: Patient Active Problem List   Diagnosis Date Noted  . Bilateral pulmonary embolism ([place]) 05/24/2019  . COVID-19 virus infection 05/24/2019  . Thrombocytopenia ([place]) 05/24/2019  . Anxiety 11/29/2018  . Asthma 08/08/2012   Past Medical History:  Diagnosis Date  . Anxiety   . Asthma     Family History  Problem Relation Age of Onset  . Heart disease Paternal Grandfather     No past surgical history on file. Social History   Occupational History  . Not on file  Tobacco Use  .  Smoking status: Never Smoker  . Smokeless tobacco: Never Used  Vaping Use  . Vaping Use: Never used  Substance and Sexual Activity  . Alcohol use: No  . Drug use: No  . Sexual activity: Not on file

## 2020-10-16 ENCOUNTER — Other Ambulatory Visit: Payer: Self-pay | Admitting: Family Medicine

## 2020-10-16 DIAGNOSIS — F411 Generalized anxiety disorder: Secondary | ICD-10-CM

## 2020-10-27 ENCOUNTER — Encounter: Payer: Self-pay | Admitting: Family Medicine

## 2020-10-27 NOTE — Patient Instructions (Addendum)
Good to see you again today!  I will be in touch with your blood counts and your urine culture We will set you up to see a female gastro specialist about the blood in your stool If this is getting worse or if you have black stools please contact me Try a stool softener such as docusate sodium daily- this may help with your hard stools and bleeding

## 2020-10-27 NOTE — Progress Notes (Addendum)
[place] at [facility], [place], [place] [address] [phone]  Date:  10/28/2020   Name:  Kayla Hayes   DOB:  2000/08/21   MRN:  [id number]  PCP:  Darreld Mclean, MD    Chief Complaint: Immunizations and Blood In Stools (One week, no n/v, no changes in appetite, no abdominal pain/)   History of Present Illness:  Kayla Hayes is a 20 y.o. very pleasant female patient who presents with the following:  Here today with a couple of concerns Needs HPV dose- will give today  She also contacted me recently with the following mychart message:  also, out of the blue i am finding blood in my stool. it's becoming more frequent, so i thought i would shoot you a message and see what i should do. i also am having to urinate urgently, out of nowhere.  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  She notes bright red blood which can be present in the toilet bowl and on the paper The bleeding did start after she went on xarelto   She tends to be constipated. Very rare diarrhea No black stools  No nosebleeds or other unusual bleeding or bruising  She also has noted some urinary urgency for the last 2 weeks No pain, no hematuria  LMP is current   Patient Active Problem List   Diagnosis Date Noted  . Bilateral pulmonary embolism ([place]) 05/24/2019  . COVID-19  virus infection 05/24/2019  . Thrombocytopenia ([place]) 05/24/2019  . Anxiety 11/29/2018  . Asthma 08/08/2012    Past Medical History:  Diagnosis Date  . Anxiety   . Asthma     No past surgical history on file.  Social History   Tobacco Use  . Smoking status: Never Smoker  . Smokeless tobacco: Never Used  Vaping Use  . Vaping Use: Never used  Substance Use Topics  . Alcohol use: No  . Drug use: No    Family History  Problem Relation Age of Onset  . Heart disease Paternal Grandfather     No Known Allergies  Medication list has been reviewed and updated.  Current Outpatient Medications on File Prior to Visit  Medication Sig Dispense Refill  . Ascorbic Acid (VITAMIN C PO) Take 1 tablet by mouth at bedtime.    . cetirizine (ZYRTEC) 10 MG tablet Take 1 tablet (10 mg total) by mouth at bedtime. Use as needed for allergies 30 tablet 11  . fluticasone (FLONASE) 50 MCG/ACT nasal spray Place 2 sprays into both nostrils daily. 9.9 g 0  . levonorgestrel (KYLEENA) 19.5 MG IUD by Intrauterine route once.    . methocarbamol (ROBAXIN) 500 MG tablet Take 1 tablet (500 mg total) by mouth every 8 (eight) hours as needed for muscle spasms. 30 tablet 0  . montelukast (SINGULAIR) 10 MG tablet Take 25 mg  by mouth at bedtime.    . rivaroxaban (XARELTO) 10 MG TABS tablet Take 1 tablet (10 mg total) by mouth daily. 90 tablet 2  . venlafaxine XR (EFFEXOR-XR) 150 MG 24 hr capsule TAKE 1 CAPSULE BY MOUTH EVERY DAY 30 capsule 11  . VITAMIN D PO Take 1 tablet by mouth at bedtime.     No current facility-administered medications on file prior to visit.    Review of Systems:  As per HPI- otherwise negative.  Pulse Readings from Last 3 Encounters:  10/28/20 (!) 111  08/20/20 (!) 101  07/21/20 (!) 106    Physical Examination: Vitals:   10/28/20 1003  BP: 118/70  Pulse: (!) 111  Resp: 18  Temp: 98 F (36.7 C)  SpO2: 98%   Vitals:   10/28/20 1003  Weight: 261 lb (118.4 kg)  Height:  5\' 7"  (1.702 m)   Body mass index is 40.88 kg/m. Ideal Body Weight: Weight in (lb) to have BMI = 25: 159.3  GEN: no acute distress.  Obese, otherwise looks well HEENT: Atraumatic, Normocephalic.  Ears and Nose: No external deformity. CV: RRR, No M/G/R. No JVD. No thrill. No extra heart sounds. PULM: CTA B, no wheezes, crackles, rhonchi. No retractions. No resp. distress. No accessory muscle use. ABD: S, NT, ND, +BS. No rebound. No HSM.  Benign belly Rectal exam is normal, no external hemorrhoids and no gross bleeding EXTR: No c/c/e PSYCH: Normally interactive. Conversant.  Results for orders placed or performed in visit on 10/28/20  POCT urinalysis dipstick  Result Value Ref Range   Color, UA yellow yellow   Clarity, UA clear clear   Glucose, UA negative negative mg/dL   Bilirubin, UA negative negative   Ketones, POC UA negative negative mg/dL   Spec Grav, UA 1.020 1.010 - 1.025   Blood, UA negative negative   pH, UA 6.5 5.0 - 8.0   Protein Ur, POC trace (A) negative mg/dL   Urobilinogen, UA 0.2 0.2 or 1.0 E.U./dL   Nitrite, UA Negative Negative   Leukocytes, UA Negative Negative  POCT urine pregnancy  Result Value Ref Range   Preg Test, Ur Negative Negative    Assessment and Plan: Dysuria - Plan: POCT urinalysis dipstick, Urine Culture, POCT urine pregnancy  Immunization due - Plan: HPV 9-valent vaccine,Recombinat  Blood in stool - Plan: CBC, Ambulatory referral to Gastroenterology  Patient today with a couple of concerns.  We gave second dose of Gardasil today She has noted some mild urinary symptoms.  Urine dip appears benign, only culture and will treat if necessary She has noted persistent blood in her stool for about 1 year.  Suspect this may be due to internal hemorrhoids, bleeding may be exacerbated by use of Xarelto. I will check her blood counts today, referral to gastroenterology made for further evaluation.  She is asked to contact me if any worsening or  change of her symptoms in the meantime Recommend stool softener tell prevent constipation which may exacerbate hemorrhoids  This visit occurred during the SARS-CoV-2 public health emergency.  Safety protocols were in place, including screening questions prior to the visit, additional usage of staff PPE, and extensive cleaning of exam room while observing appropriate contact time as indicated for disinfecting solutions.    Signed Lamar Blinks, MD  Received her labs as below, message to patient  Results for orders placed or performed in visit on 10/28/20  CBC  Result Value Ref Range   WBC 6.3 4.0 - 10.5 K/uL  RBC 4.74 3.87 - 5.11 Mil/uL   Platelets 271.0 150.0 - 400.0 K/uL   Hemoglobin 13.9 12.0 - 15.0 g/dL   HCT 40.6 36.0 - 46.0 %   MCV 85.6 78.0 - 100.0 fl   MCHC 34.2 30.0 - 36.0 g/dL   RDW 12.8 11.5 - 15.5 %  POCT urinalysis dipstick  Result Value Ref Range   Color, UA yellow yellow   Clarity, UA clear clear   Glucose, UA negative negative mg/dL   Bilirubin, UA negative negative   Ketones, POC UA negative negative mg/dL   Spec Grav, UA 1.020 1.010 - 1.025   Blood, UA negative negative   pH, UA 6.5 5.0 - 8.0   Protein Ur, POC trace (A) negative mg/dL   Urobilinogen, UA 0.2 0.2 or 1.0 E.U./dL   Nitrite, UA Negative Negative   Leukocytes, UA Negative Negative  POCT urine pregnancy  Result Value Ref Range   Preg Test, Ur Negative Negative

## 2020-10-28 ENCOUNTER — Other Ambulatory Visit: Payer: Self-pay

## 2020-10-28 ENCOUNTER — Encounter: Payer: Self-pay | Admitting: Family Medicine

## 2020-10-28 ENCOUNTER — Ambulatory Visit: Payer: 59 | Admitting: Family Medicine

## 2020-10-28 VITALS — BP 118/70 | HR 106 | Temp 98.0°F | Resp 18 | Ht 67.0 in | Wt 261.0 lb

## 2020-10-28 DIAGNOSIS — R300 Dysuria: Secondary | ICD-10-CM

## 2020-10-28 DIAGNOSIS — K921 Melena: Secondary | ICD-10-CM | POA: Diagnosis not present

## 2020-10-28 DIAGNOSIS — Z23 Encounter for immunization: Secondary | ICD-10-CM

## 2020-10-28 LAB — CBC
HCT: 40.6 % (ref 36.0–46.0)
Hemoglobin: 13.9 g/dL (ref 12.0–15.0)
MCHC: 34.2 g/dL (ref 30.0–36.0)
MCV: 85.6 fl (ref 78.0–100.0)
Platelets: 271 10*3/uL (ref 150.0–400.0)
RBC: 4.74 Mil/uL (ref 3.87–5.11)
RDW: 12.8 % (ref 11.5–15.5)
WBC: 6.3 10*3/uL (ref 4.0–10.5)

## 2020-10-28 LAB — POCT URINALYSIS DIP (MANUAL ENTRY)
Bilirubin, UA: NEGATIVE
Blood, UA: NEGATIVE
Glucose, UA: NEGATIVE mg/dL
Ketones, POC UA: NEGATIVE mg/dL
Leukocytes, UA: NEGATIVE
Nitrite, UA: NEGATIVE
Spec Grav, UA: 1.02 (ref 1.010–1.025)
Urobilinogen, UA: 0.2 E.U./dL
pH, UA: 6.5 (ref 5.0–8.0)

## 2020-10-28 LAB — POCT URINE PREGNANCY: Preg Test, Ur: NEGATIVE

## 2020-10-29 LAB — URINE CULTURE
MICRO NUMBER:: 11877087
SPECIMEN QUALITY:: ADEQUATE

## 2020-11-03 ENCOUNTER — Encounter: Payer: Self-pay | Admitting: Physician Assistant

## 2020-11-04 ENCOUNTER — Encounter: Payer: Self-pay | Admitting: Family Medicine

## 2020-11-04 DIAGNOSIS — J040 Acute laryngitis: Secondary | ICD-10-CM

## 2020-11-05 MED ORDER — PREDNISONE 20 MG PO TABS: ORAL_TABLET | ORAL | 0 refills | Status: DC

## 2020-11-05 NOTE — Addendum Note (Signed)
Addended by: Lamar Blinks C on: 11/05/2020 06:17 AM   Modules accepted: Orders

## 2020-11-09 MED ORDER — AMOXICILLIN 500 MG PO CAPS: 1000.0000 mg | ORAL_CAPSULE | Freq: Two times a day (BID) | ORAL | 0 refills | Status: DC

## 2020-11-09 NOTE — Addendum Note (Signed)
Addended by: Lamar Blinks C on: 11/09/2020 12:53 PM   Modules accepted: Orders

## 2020-11-12 ENCOUNTER — Other Ambulatory Visit: Payer: Self-pay | Admitting: Family Medicine

## 2020-11-24 ENCOUNTER — Inpatient Hospital Stay: Payer: 59 | Admitting: Family

## 2020-11-24 ENCOUNTER — Ambulatory Visit: Payer: 59 | Admitting: Family

## 2020-11-24 ENCOUNTER — Other Ambulatory Visit: Payer: Self-pay

## 2020-11-24 ENCOUNTER — Inpatient Hospital Stay: Payer: 59 | Attending: Family

## 2020-11-24 ENCOUNTER — Encounter: Payer: Self-pay | Admitting: Family

## 2020-11-24 ENCOUNTER — Other Ambulatory Visit: Payer: 59

## 2020-11-24 VITALS — BP 130/74 | HR 100 | Temp 98.3°F | Ht 67.0 in | Wt 262.0 lb

## 2020-11-24 DIAGNOSIS — I2699 Other pulmonary embolism without acute cor pulmonale: Secondary | ICD-10-CM

## 2020-11-24 DIAGNOSIS — R768 Other specified abnormal immunological findings in serum: Secondary | ICD-10-CM | POA: Diagnosis not present

## 2020-11-24 DIAGNOSIS — D6859 Other primary thrombophilia: Secondary | ICD-10-CM

## 2020-11-24 DIAGNOSIS — Z86711 Personal history of pulmonary embolism: Secondary | ICD-10-CM | POA: Diagnosis not present

## 2020-11-24 DIAGNOSIS — Z7901 Long term (current) use of anticoagulants: Secondary | ICD-10-CM | POA: Diagnosis not present

## 2020-11-24 DIAGNOSIS — R760 Raised antibody titer: Secondary | ICD-10-CM | POA: Diagnosis not present

## 2020-11-24 DIAGNOSIS — D6862 Lupus anticoagulant syndrome: Secondary | ICD-10-CM | POA: Diagnosis not present

## 2020-11-24 LAB — CBC WITH DIFFERENTIAL (CANCER CENTER ONLY)
Abs Immature Granulocytes: 0.01 10*3/uL (ref 0.00–0.07)
Basophils Absolute: 0 10*3/uL (ref 0.0–0.1)
Basophils Relative: 1 %
Eosinophils Absolute: 0.1 10*3/uL (ref 0.0–0.5)
Eosinophils Relative: 1 %
HCT: 39.8 % (ref 36.0–46.0)
Hemoglobin: 13.7 g/dL (ref 12.0–15.0)
Immature Granulocytes: 0 %
Lymphocytes Relative: 24 %
Lymphs Abs: 1.4 10*3/uL (ref 0.7–4.0)
MCH: 29.1 pg (ref 26.0–34.0)
MCHC: 34.4 g/dL (ref 30.0–36.0)
MCV: 84.7 fL (ref 80.0–100.0)
Monocytes Absolute: 0.4 10*3/uL (ref 0.1–1.0)
Monocytes Relative: 7 %
Neutro Abs: 4 10*3/uL (ref 1.7–7.7)
Neutrophils Relative %: 67 %
Platelet Count: 260 10*3/uL (ref 150–400)
RBC: 4.7 MIL/uL (ref 3.87–5.11)
RDW: 12.4 % (ref 11.5–15.5)
WBC Count: 5.9 10*3/uL (ref 4.0–10.5)
nRBC: 0 % (ref 0.0–0.2)

## 2020-11-24 LAB — CMP (CANCER CENTER ONLY)
ALT: 21 U/L (ref 0–44)
AST: 16 U/L (ref 15–41)
Albumin: 4.2 g/dL (ref 3.5–5.0)
Alkaline Phosphatase: 63 U/L (ref 38–126)
Anion gap: 7 (ref 5–15)
BUN: 10 mg/dL (ref 6–20)
CO2: 26 mmol/L (ref 22–32)
Calcium: 9.6 mg/dL (ref 8.9–10.3)
Chloride: 106 mmol/L (ref 98–111)
Creatinine: 0.68 mg/dL (ref 0.44–1.00)
GFR, Estimated: >60 mL/min (ref >60)
Glucose, Bld: 106 mg/dL — ABNORMAL HIGH (ref 70–99)
Potassium: 3.7 mmol/L (ref 3.5–5.1)
Sodium: 139 mmol/L (ref 135–145)
Total Bilirubin: 0.6 mg/dL (ref 0.3–1.2)
Total Protein: 6.4 g/dL — ABNORMAL LOW (ref 6.5–8.1)

## 2020-11-24 LAB — D-DIMER, QUANTITATIVE: D-Dimer, Quant: <0.27 ug/mL-FEU (ref 0.00–0.50)

## 2020-11-24 NOTE — Progress Notes (Signed)
Hematology and Oncology Follow Up Visit  Kayla Hayes [id number] [birth year] 20 y.o. 11/24/2020   Principle Diagnosis:  Bilateral pulmonary emboli- resolved on CT angio 09/12/2019 Lupus anticoagulant positive Mildly elevated anticardiolipin IgM  Current Therapy: Xarelto 10 mg PO daily   Interim History:  Kayla Hayes is here today for follow-up. She is doing well and has no complaints at this time.  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  She continues to tolerate Xarelto nicely.  Her cycle is regular with heavy flow. No other blood loss noted. No abnormal bruising, no petechiae.  No fever, chills, n/v, cough, rash, dizziness, SOB, chest pain, palpitations, abdominal pain or changes in bowel or bladder habits.  No swelling, tenderness, numbness or tingling in her extremities.  Pedal pulses are 3+.  No falls or syncope to report.  She has maintained a good appetite and is staying well hydrated throughout the day.  Her weight is stable at 262 lbs.   ECOG Performance Status: 0 - Asymptomatic  Medications:  Allergies as of 11/24/2020   No Known Allergies     Medication List       Accurate as of November 24, 2020  9:20 AM. If you have any questions, ask your nurse or doctor.        albuterol 108 (90 Base) MCG/ACT inhaler Commonly known as: VENTOLIN HFA Inhale 2 puffs into the lungs every 4 (four) hours as needed.   amoxicillin 500 MG capsule Commonly known as: AMOXIL Take 2 capsules (1,000 mg total) by mouth 2 (two) times daily.   cetirizine 10 MG tablet Commonly known as: ZYRTEC TAKE 1 TABLET (10 MG TOTAL) BY MOUTH AT BEDTIME. USE AS NEEDED FOR ALLERGIES   fluticasone 50 MCG/ACT nasal spray Commonly known as: FLONASE Place 2 sprays into both nostrils daily.   Kayla Hayes 19.5 MG IUD Generic drug: levonorgestrel by Intrauterine route once.   methocarbamol 500 MG tablet Commonly known as: Robaxin Take 1 tablet (500  mg total) by mouth every 8 (eight) hours as needed for muscle spasms.   montelukast 10 MG tablet Commonly known as: SINGULAIR Take 25 mg by mouth at bedtime.   predniSONE 20 MG tablet Commonly known as: DELTASONE Take 40 mg by mouth daily for 3 days   rivaroxaban 10 MG Tabs tablet Commonly known as: Xarelto Take 1 tablet (10 mg total) by mouth daily.   venlafaxine XR 150 MG 24 hr capsule Commonly known as: EFFEXOR-XR TAKE 1 CAPSULE BY MOUTH EVERY DAY   VITAMIN C PO Take 1 tablet by mouth at bedtime.   VITAMIN D PO Take 1 tablet by mouth at bedtime.       Allergies: No Known Allergies  Past Medical History, Surgical history, Social history, and Family History were reviewed and updated.  Review of Systems: All other 10 point review of systems is negative.   Physical Exam:  height is 5\' 7"  (1.702 m) and weight is 262 lb (118.8 kg). Her oral temperature is 98.3 F (36.8 C). Her blood pressure is 130/74 and her pulse is 100. Her oxygen saturation is 97%.   Wt Readings from Last 3 Encounters:  11/24/20 262 lb (118.8 kg)  10/28/20 261 lb (118.4 kg)  08/20/20 261 lb (118.4 kg)    Ocular: Sclerae unicteric, pupils equal, round and reactive to light Ear-nose-throat: Oropharynx clear, dentition fair Lymphatic: No cervical or supraclavicular adenopathy Lungs no rales or rhonchi, good excursion bilaterally  Heart regular rate and rhythm, no murmur appreciated Abd soft, nontender, positive bowel sounds MSK no focal spinal tenderness, no joint edema Neuro: non-focal, well-oriented, appropriate affect Breasts: Deferred   Lab Results  Component Value Date   WBC 5.9 11/24/2020   HGB 13.7 11/24/2020   HCT 39.8 11/24/2020   MCV 84.7 11/24/2020   PLT 260 11/24/2020   Lab Results  Component Value Date   FERRITIN 217 06/18/2019   Lab Results  Component Value Date   RETICCTPCT 1.7 06/18/2019   RBC 4.70 11/24/2020   No results found for: KPAFRELGTCHN, LAMBDASER,  KAPLAMBRATIO No results found for: IGGSERUM, IGA, IGMSERUM No results found for: Odetta Pink, SPEI   Chemistry      Component Value Date/Time   NA 139 11/24/2020 0837   K 3.7 11/24/2020 0837   CL 106 11/24/2020 0837   CO2 26 11/24/2020 0837   BUN 10 11/24/2020 0837   CREATININE 0.68 11/24/2020 0837      Component Value Date/Time   CALCIUM 9.6 11/24/2020 0837   ALKPHOS 63 11/24/2020 0837   AST 16 11/24/2020 0837   ALT 21 11/24/2020 0837   BILITOT 0.6 11/24/2020 0837       Impression and Plan: Kayla Hayes is a very pleasant 20 yo caucasian female withhistory ofbilateral pulmonary emboli diagnosed along with Covid the same day. She was lupus anticoagulant positive and had mildly elevated anticardiolipin IgM(30).  She will continue her same regimen with Xarelto 10 mg PO to complete one full year of maintenance therapy in December 2022.  D-dimer < 0.27.  Follow-up in 6 months.  She can contact our office with any questions or concerns.   Laverna Peace, NP 6/7/20229:20 AM

## 2020-11-25 ENCOUNTER — Other Ambulatory Visit: Payer: Self-pay | Admitting: *Deleted

## 2020-11-25 LAB — DRVVT CONFIRM: dRVVT Confirm: 1.1 ratio (ref 0.8–1.2)

## 2020-11-25 LAB — CARDIOLIPIN ANTIBODIES, IGG, IGM, IGA
Anticardiolipin IgA: <9 APL U/mL (ref 0–11)
Anticardiolipin IgG: <9 GPL U/mL (ref 0–14)
Anticardiolipin IgM: <9 MPL U/mL (ref 0–12)

## 2020-11-25 LAB — LUPUS ANTICOAGULANT PANEL
DRVVT: 55.2 s — ABNORMAL HIGH (ref 0.0–47.0)
PTT Lupus Anticoagulant: 33.8 s (ref 0.0–51.9)

## 2020-11-25 LAB — DRVVT MIX: dRVVT Mix: 49.6 s — ABNORMAL HIGH (ref 0.0–40.4)

## 2020-11-25 MED ORDER — CETIRIZINE HCL 10 MG PO TABS: 10.0000 mg | ORAL_TABLET | Freq: Every day | ORAL | 11 refills | Status: DC

## 2020-11-27 ENCOUNTER — Ambulatory Visit: Payer: 59 | Admitting: Physician Assistant

## 2020-12-02 ENCOUNTER — Telehealth: Payer: Self-pay

## 2021-02-18 ENCOUNTER — Encounter: Payer: Self-pay | Admitting: Internal Medicine

## 2021-02-22 NOTE — Patient Instructions (Addendum)
Good to see you again today! I will be in touch with your labs You can let your GYN know you have had all 3 doses of Gardasil   Assuming all is well please see me in about 1 year

## 2021-02-22 NOTE — Progress Notes (Addendum)
[place] at [facility] [address], [place], [place] [address] [phone]  Date:  02/25/2021   Name:  Kayla Hayes   DOB:  03-04-2001   MRN:  [id number]  PCP:  Darreld Mclean, MD    Chief Complaint: Anxiety (6 month follow up)   History of Present Illness:  Kayla Hayes is a 20 y.o. 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  She is on xarelto for 2 years total- may be able to discontinue after that time/December 2022.  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  We presume this is due to her being on Xarelto and hope this will resolve when she is able to stop her blood thinner  She does not smoke  She is trying to get back to exercise, she goes to the gym about 3 days a week No chest pain or shortness of breath with exercise  We discussed possible future locations of her lupus anticoagulant disorder.  I advised her that estrogen-containing contraceptives may not be ideal for her.  Also, I would encourage her to discuss and pregnancy plans with her OB/GYN prior to conception in the future -in case there are any interventions that may be needed   Patient Active Problem List   Diagnosis Date  Noted   Bilateral pulmonary embolism ([place]) 05/24/2019   COVID-19 virus infection 05/24/2019   Thrombocytopenia ([place]) 05/24/2019   Anxiety 11/29/2018   Asthma 08/08/2012    Past Medical History:  Diagnosis Date   Anxiety    Asthma     No past surgical history on file.  Social History   Tobacco Use   Smoking status: Never   Smokeless tobacco: Never  Vaping Use   Vaping Use: Never used  Substance Use Topics   Alcohol use: No   Drug use: No    Family History  Problem Relation Age of Onset   Heart disease Paternal Grandfather     No Known Allergies  Medication list has been reviewed and updated.  Current Outpatient Medications on File Prior to Visit  Medication Sig Dispense Refill   albuterol (VENTOLIN HFA) 108 (90 Base) MCG/ACT inhaler Inhale 2 puffs into the lungs every 4 (four) hours as needed.     Ascorbic Acid (VITAMIN C PO) Take 1 tablet by mouth at bedtime.     cetirizine (ZYRTEC) 10 MG tablet Take 1 tablet (10 mg total) by mouth at bedtime. Use as needed for allergies 30 tablet 11   fluticasone (FLONASE) 50 MCG/ACT nasal spray Place 2 sprays into both nostrils daily. 9.9 g 0   levonorgestrel (KYLEENA) 19.5 MG IUD by Intrauterine route  once.     methocarbamol (ROBAXIN) 500 MG tablet Take 1 tablet (500 mg total) by mouth every 8 (eight) hours as needed for muscle spasms. 30 tablet 0   montelukast (SINGULAIR) 10 MG tablet Take 25 mg by mouth at bedtime.     rivaroxaban (XARELTO) 10 MG TABS tablet Take 1 tablet (10 mg total) by mouth daily. 90 tablet 2   venlafaxine XR (EFFEXOR-XR) 150 MG 24 hr capsule TAKE 1 CAPSULE BY MOUTH EVERY DAY 30 capsule 11   VITAMIN D PO Take 1 tablet by mouth at bedtime.     No current facility-administered medications on file prior to visit.    Review of Systems:  As per HPI- otherwise negative.  Wt Readings from Last 3 Encounters:  02/25/21 262 lb (118.8 kg)  11/24/20 262 lb (118.8 kg)  10/28/20 261 lb (118.4 kg)       Physical Examination: Vitals:   02/25/21 0814 02/25/21 0836  BP: 122/80   Pulse: (!) 109 90  Resp: 17   Temp: 97.8 F (36.6 C)   SpO2: 99%    Vitals:   02/25/21 0814  Weight: 262 lb (118.8 kg)  Height: '5\' 7"'$  (1.702 m)   Body mass index is 41.04 kg/m. Ideal Body Weight: Weight in (lb) to have BMI = 25: 159.3  GEN: no acute distress.  Obese, looks well HEENT: Atraumatic, Normocephalic.  Ears and Nose: No external deformity. CV: RRR, No M/G/R. No JVD. No thrill. No extra heart sounds. PULM: CTA B, no wheezes, crackles, rhonchi. No retractions. No resp. distress. No accessory muscle use. ABD: S, NT, ND No rebound. No HSM. EXTR: No c/c/e PSYCH: Normally interactive. Conversant.    Assessment and Plan: Screening for hyperlipidemia - Plan: Lipid panel  Chronic bilateral low back pain without sciatica  Screening for diabetes mellitus - Plan: Hemoglobin A1c  Screening for thyroid disorder - Plan: TSH  Fatigue, unspecified type - Plan: VITAMIN D 25 Hydroxy (Vit-D Deficiency, Fractures)  Immunization due - Plan: HPV 9-valent vaccine,Recombinat, Flu Vaccine QUAD 6+ mos PF IM (Fluarix Quad PF)  Class 3 severe obesity without serious comorbidity with body mass index (BMI) of 40.0 to 44.9 in adult, unspecified obesity type ([place])  Seen today for follow-up.  She is taking Xarelto for pulmonary embolism which occurred almost 2 years ago, should be able to stop this medication in December No bleeding or other side effects  Routine labs pending as above Weight is stable, she is trying to increase her exercise  Gave flu shot and third dose of Gardasil today This visit occurred during the SARS-CoV-2 public health emergency.  Safety protocols were in place, including screening questions prior to the visit, additional usage of staff PPE, and extensive cleaning of exam room while observing appropriate contact time as indicated for disinfecting solutions.   Signed Lamar Blinks, MD  Received her labs as below, message to patient  Results for orders placed or performed in visit on 02/25/21  Lipid panel  Result Value Ref Range   Cholesterol 168 0 - 200 mg/dL   Triglycerides 162.0 (H) 0.0 - 149.0 mg/dL   HDL 39.50 >39.00 mg/dL   VLDL 32.4 0.0 - 40.0 mg/dL   LDL Cholesterol 96 0 - 99 mg/dL   Total CHOL/HDL Ratio 4    NonHDL 128.07   Hemoglobin A1c  Result Value Ref Range   Hgb A1c MFr Bld 5.3 4.6 - 6.5 %  TSH  Result Value Ref Range   TSH 1.52  0.35 - 5.50 uIU/mL  VITAMIN D 25 Hydroxy (Vit-D Deficiency, Fractures)  Result Value Ref Range   VITD 30.97 30.00 - 100.00 ng/mL

## 2021-02-25 ENCOUNTER — Encounter: Payer: Self-pay | Admitting: Family Medicine

## 2021-02-25 ENCOUNTER — Other Ambulatory Visit: Payer: Self-pay

## 2021-02-25 ENCOUNTER — Ambulatory Visit: Payer: 59 | Admitting: Family Medicine

## 2021-02-25 VITALS — BP 122/80 | HR 90 | Temp 97.8°F | Resp 17 | Ht 67.0 in | Wt 262.0 lb

## 2021-02-25 DIAGNOSIS — Z13220 Encounter for screening for lipoid disorders: Secondary | ICD-10-CM

## 2021-02-25 DIAGNOSIS — Z23 Encounter for immunization: Secondary | ICD-10-CM | POA: Diagnosis not present

## 2021-02-25 DIAGNOSIS — M545 Low back pain, unspecified: Secondary | ICD-10-CM

## 2021-02-25 DIAGNOSIS — Z1329 Encounter for screening for other suspected endocrine disorder: Secondary | ICD-10-CM

## 2021-02-25 DIAGNOSIS — Z6841 Body Mass Index (BMI) 40.0 and over, adult: Secondary | ICD-10-CM

## 2021-02-25 DIAGNOSIS — Z131 Encounter for screening for diabetes mellitus: Secondary | ICD-10-CM | POA: Diagnosis not present

## 2021-02-25 DIAGNOSIS — G8929 Other chronic pain: Secondary | ICD-10-CM

## 2021-02-25 DIAGNOSIS — R5383 Other fatigue: Secondary | ICD-10-CM

## 2021-02-25 LAB — LIPID PANEL
Cholesterol: 168 mg/dL (ref 0–200)
HDL: 39.5 mg/dL (ref >39.00)
LDL Cholesterol: 96 mg/dL (ref 0–99)
NonHDL: 128.07
Total CHOL/HDL Ratio: 4
Triglycerides: 162 mg/dL — ABNORMAL HIGH (ref 0.0–149.0)
VLDL: 32.4 mg/dL (ref 0.0–40.0)

## 2021-02-25 LAB — TSH: TSH: 1.52 u[IU]/mL (ref 0.35–5.50)

## 2021-02-25 LAB — HEMOGLOBIN A1C: Hgb A1c MFr Bld: 5.3 % (ref 4.6–6.5)

## 2021-02-25 LAB — VITAMIN D 25 HYDROXY (VIT D DEFICIENCY, FRACTURES): VITD: 30.97 ng/mL (ref 30.00–100.00)

## 2021-03-04 ENCOUNTER — Encounter: Payer: Self-pay | Admitting: Family Medicine

## 2021-03-04 MED ORDER — CYCLOBENZAPRINE HCL 10 MG PO TABS: 5.0000 mg | ORAL_TABLET | Freq: Two times a day (BID) | ORAL | 0 refills | Status: DC | PRN

## 2021-03-14 ENCOUNTER — Other Ambulatory Visit: Payer: Self-pay | Admitting: Family Medicine

## 2021-03-18 ENCOUNTER — Other Ambulatory Visit: Payer: Self-pay

## 2021-03-18 ENCOUNTER — Ambulatory Visit: Payer: 59 | Admitting: Internal Medicine

## 2021-03-18 ENCOUNTER — Encounter: Payer: Self-pay | Admitting: Internal Medicine

## 2021-03-18 ENCOUNTER — Telehealth: Payer: Self-pay | Admitting: *Deleted

## 2021-03-18 VITALS — BP 140/80 | HR 104 | Ht 67.0 in | Wt 259.0 lb

## 2021-03-18 DIAGNOSIS — K6289 Other specified diseases of anus and rectum: Secondary | ICD-10-CM | POA: Diagnosis not present

## 2021-03-18 DIAGNOSIS — K5900 Constipation, unspecified: Secondary | ICD-10-CM

## 2021-03-18 DIAGNOSIS — I2699 Other pulmonary embolism without acute cor pulmonale: Secondary | ICD-10-CM

## 2021-03-18 DIAGNOSIS — K625 Hemorrhage of anus and rectum: Secondary | ICD-10-CM

## 2021-03-18 MED ORDER — NA SULFATE-K SULFATE-MG SULF 17.5-3.13-1.6 GM/177ML PO SOLN: 1.0000 | Freq: Once | ORAL | 0 refills | Status: AC

## 2021-03-18 MED ORDER — RIVAROXABAN 10 MG PO TABS: 10.0000 mg | ORAL_TABLET | Freq: Every day | ORAL | 2 refills | Status: DC

## 2021-03-18 NOTE — Progress Notes (Signed)
Chief Complaint: Rectal bleeding  HPI : 20 year old female with history of PE on Xarelto, APLA, asthma, anxiety presents with rectal bleeding.  She has seen blood in her stools for a year or so. The bleeding feels like a lot, sometimes when she wipes and sometimes in the toilet bowl. This started after she was put on Xarelto. Has rectal pain and abdominal pain occasionally when she is about to have a BM. She has constipation for 5 years where she has a BM every 2-4 days. Denies straining. Stool comes out hard. She has never been told that she has hemorrhoids. Denies weight loss. Denies fam hx of colon cancer or IBD.   Past Medical History:  Diagnosis Date   Anxiety    Asthma    Pulmonary emboli ([place]) 05/2019   both lungs     Past Surgical History:  Procedure Laterality Date   MOUTH SURGERY     wisdom teeth   Family History  Problem Relation Age of Onset   Diabetes Maternal Grandmother    Diabetes Maternal Grandfather    Heart disease Paternal Grandfather    Colon cancer Neg Hx    Esophageal cancer Neg Hx    Rectal cancer Neg Hx    Social History   Tobacco Use   Smoking status: Never   Smokeless tobacco: Never  Vaping Use   Vaping Use: Never used  Substance Use Topics   Alcohol use: No   Drug use: No   Current Outpatient Medications  Medication Sig Dispense Refill   albuterol (VENTOLIN HFA) 108 (90 Base) MCG/ACT inhaler Inhale 2 puffs into the lungs every 4 (four) hours as needed.     Ascorbic Acid (VITAMIN C PO) Take 1 tablet by mouth at bedtime.     cetirizine (ZYRTEC) 10 MG tablet Take 1 tablet (10 mg total) by mouth at bedtime. Use as needed for allergies 30 tablet 11   cyclobenzaprine (FLEXERIL) 10 MG tablet TAKE 0.5-1 TABLETS (5-10 MG TOTAL) BY MOUTH 2 (TWO) TIMES DAILY AS NEEDED FOR MUSCLE SPASMS. 30 tablet 0   fluticasone (FLONASE) 50 MCG/ACT nasal spray Place 2 sprays into both nostrils daily. 9.9 g 0   levonorgestrel (KYLEENA) 19.5 MG IUD by Intrauterine  route once.     methocarbamol (ROBAXIN) 500 MG tablet Take 1 tablet (500 mg total) by mouth every 8 (eight) hours as needed for muscle spasms. 30 tablet 0   montelukast (SINGULAIR) 10 MG tablet Take 25 mg by mouth at bedtime.     Na Sulfate-K Sulfate-Mg Sulf 17.5-3.13-1.6 GM/177ML SOLN Take 1 kit by mouth once for 1 dose. 354 mL 0   venlafaxine XR (EFFEXOR-XR) 150 MG 24 hr capsule TAKE 1 CAPSULE BY MOUTH EVERY DAY 30 capsule 11   VITAMIN D PO Take 1 tablet by mouth at bedtime.     rivaroxaban (XARELTO) 10 MG TABS tablet Take 1 tablet (10 mg total) by mouth daily. 90 tablet 2   No current facility-administered medications for this visit.   No Known Allergies   Review of Systems: All systems reviewed and negative except where noted in HPI.   Physical Exam: BP 140/80   Pulse (!) 104   Ht 5' 7"  (1.702 m)   Wt 259 lb (117.5 kg)   BMI 40.57 kg/m  Constitutional: Pleasant,well-developed, female in no acute distress. HEENT: Normocephalic and atraumatic. Conjunctivae are normal. No scleral icterus. Cardiovascular: Normal rate, regular rhythm.  Pulmonary/chest: Effort normal and breath sounds normal. No wheezing, rales  or rhonchi. Abdominal: Soft, nondistended, nontender. Bowel sounds active throughout. There are no masses palpable. No hepatomegaly. Extremities: No edema Neurological: Alert and oriented to person place and time. Skin: Skin is warm and dry. No rashes noted. Psychiatric: Normal mood and affect. Behavior is normal.  Labs 11/24/20: Hb normal.  ASSESSMENT AND PLAN: Rectal bleeding Rectal pain Constipation Presents with rectal bleeding and rectal pain over the last year after being started on her Xarelto for PE that she developed after getting COVID. Hb has been stable despite this rectal bleeding, which is reassuring. Her symptoms may be related to hemorrhoids, but will plan for a colonoscopy for further evaluation in order to rule out IBD or malignancy. Will also work on her  constipation prior to undergoing colonoscopy. - Start docusate 200 mg QD - Start Miralax QD - Plan for colonoscopy [facility]. Will need Xarelto held before the procedure.  Christia Reading, MD

## 2021-03-18 NOTE — Telephone Encounter (Signed)
   JACEE ENERSON 2001-05-10 [id number]  Dear Judson Roch:  We have scheduled the above named patient for a(n) colonoscopy procedure. Our records show that (s)he is on anticoagulation therapy.  Please advise as to whether the patient may come off their therapy of Xarelto 2 days prior to their procedure which is scheduled for 05/10/21.  Please route your response to Caryl Asp, [place] or fax response to [phone].  Sincerely,   Caryl Asp, [facility]

## 2021-03-18 NOTE — Patient Instructions (Addendum)
Start Colace 1 tablet daily.   Start Miralax 1 capful daily in 8 ounces of liquid.   You will be contacted by our office prior to your procedure for directions on holding your Xarelto.  If you do not hear from our office 1 week prior to your scheduled procedure, please call [phone] to discuss.   You have been scheduled for a colonoscopy. Please follow written instructions given to you at your visit today.  Please pick up your prep supplies at the pharmacy within the next 1-3 days. If you use inhalers (even only as needed), please bring them with you on the day of your procedure.  If you are age 21 or older, your body mass index should be between 23-30. Your Body mass index is 40.57 kg/m. If this is out of the aforementioned range listed, please consider follow up with your Primary Care Provider.  If you are age 33 or younger, your body mass index should be between 19-25. Your Body mass index is 40.57 kg/m. If this is out of the aformentioned range listed, please consider follow up with your Primary Care Provider.   __________________________________________________________  The [facility] providers would like to encourage you to use [facility] to communicate with providers for non-urgent requests or questions.  Due to long hold times on the telephone, sending your provider a message by [facility] may be a faster and more efficient way to get a response.  Please allow 48 business hours for a response.  Please remember that this is for non-urgent requests.

## 2021-03-22 ENCOUNTER — Telehealth: Payer: Self-pay | Admitting: Internal Medicine

## 2021-03-22 NOTE — Telephone Encounter (Signed)
Inbound call from pt requesting a call back asking if there was an alternative to the suprep. Please advise. Thank you.

## 2021-03-23 NOTE — Telephone Encounter (Signed)
Patient states suprep cost more than she was able to afford currently and requested something cheaper. Sent her instructions for Miralax prep via mychart.

## 2021-04-09 ENCOUNTER — Other Ambulatory Visit: Payer: Self-pay | Admitting: Family Medicine

## 2021-04-13 NOTE — Telephone Encounter (Signed)
Please advise 

## 2021-04-20 NOTE — Telephone Encounter (Signed)
Left message for patient to call office.  

## 2021-04-20 NOTE — Telephone Encounter (Signed)
Informed patient to hold Xarelto for 2 days. Patient voiced understanding.

## 2021-04-22 ENCOUNTER — Encounter: Payer: Self-pay | Admitting: Family Medicine

## 2021-04-22 ENCOUNTER — Other Ambulatory Visit: Payer: Self-pay | Admitting: Family Medicine

## 2021-05-10 ENCOUNTER — Encounter: Payer: Self-pay | Admitting: Internal Medicine

## 2021-05-10 ENCOUNTER — Ambulatory Visit (AMBULATORY_SURGERY_CENTER): Payer: 59 | Admitting: Internal Medicine

## 2021-05-10 ENCOUNTER — Other Ambulatory Visit: Payer: Self-pay

## 2021-05-10 VITALS — BP 116/64 | HR 81 | Temp 97.1°F | Resp 15 | Ht 67.0 in | Wt 259.0 lb

## 2021-05-10 DIAGNOSIS — K648 Other hemorrhoids: Secondary | ICD-10-CM | POA: Diagnosis not present

## 2021-05-10 DIAGNOSIS — K529 Noninfective gastroenteritis and colitis, unspecified: Secondary | ICD-10-CM | POA: Diagnosis not present

## 2021-05-10 DIAGNOSIS — K625 Hemorrhage of anus and rectum: Secondary | ICD-10-CM

## 2021-05-10 DIAGNOSIS — K5289 Other specified noninfective gastroenteritis and colitis: Secondary | ICD-10-CM | POA: Diagnosis not present

## 2021-05-10 DIAGNOSIS — K922 Gastrointestinal hemorrhage, unspecified: Secondary | ICD-10-CM | POA: Diagnosis not present

## 2021-05-10 MED ORDER — SODIUM CHLORIDE 0.9 % IV SOLN: 500.0000 mL | Freq: Once | INTRAVENOUS | Status: DC

## 2021-05-10 NOTE — Patient Instructions (Signed)
Please read handouts provided. Restart Xarelto tomorrow. Start Anusol HC cream twice daily for 7 days. Await pathology results.   YOU HAD AN ENDOSCOPIC PROCEDURE TODAY AT [facility]:   Refer to the procedure report that was given to you for any specific questions about what was found during the examination.  If the procedure report does not answer your questions, please call your gastroenterologist to clarify.  If you requested that your care partner not be given the details of your procedure findings, then the procedure report has been included in a sealed envelope for you to review at your convenience later.  YOU SHOULD EXPECT: Some feelings of bloating in the abdomen. Passage of more gas than usual.  Walking can help get rid of the air that was put into your GI tract during the procedure and reduce the bloating. If you had a lower endoscopy (such as a colonoscopy or flexible sigmoidoscopy) you may notice spotting of blood in your stool or on the toilet paper. If you underwent a bowel prep for your procedure, you may not have a normal bowel movement for a few days.  Please Note:  You might notice some irritation and congestion in your nose or some drainage.  This is from the oxygen used during your procedure.  There is no need for concern and it should clear up in a day or so.  SYMPTOMS TO REPORT IMMEDIATELY:  Following lower endoscopy (colonoscopy or flexible sigmoidoscopy):  Excessive amounts of blood in the stool  Significant tenderness or worsening of abdominal pains  Swelling of the abdomen that is new, acute  Fever of 100F or higher   For urgent or emergent issues, a gastroenterologist can be reached at any hour by calling [phone]. Do not use MyChart messaging for urgent concerns.    DIET:  We do recommend a small meal at first, but then you may proceed to your regular diet.  Drink plenty of fluids but you should avoid alcoholic beverages for 24  hours.  ACTIVITY:  You should plan to take it easy for the rest of today and you should NOT DRIVE or use heavy machinery until tomorrow (because of the sedation medicines used during the test).    FOLLOW UP: Our staff will call the number listed on your records 48-72 hours following your procedure to check on you and address any questions or concerns that you may have regarding the information given to you following your procedure. If we do not reach you, we will leave a message.  We will attempt to reach you two times.  During this call, we will ask if you have developed any symptoms of COVID 19. If you develop any symptoms (ie: fever, flu-like symptoms, shortness of breath, cough etc.) before then, please call [phone].  If you test positive for Covid 19 in the 2 weeks post procedure, please call and report this information to [place].    If any biopsies were taken you will be contacted by phone or by letter within the next 1-3 weeks.  Please call us at [phone] if you have not heard about the biopsies in 3 weeks.    SIGNATURES/CONFIDENTIALITY: You and/or your care partner have signed paperwork which will be entered into your electronic medical record.  These signatures attest to the fact that that the information above on your After Visit Summary has been reviewed and is understood.  Full responsibility of the confidentiality of this discharge information lies with you and/or  your care-partner.

## 2021-05-10 NOTE — Progress Notes (Signed)
GASTROENTEROLOGY PROCEDURE H&P NOTE   Primary Care Physician: Darreld Mclean, MD    Reason for Procedure:   Rectal bleeding  Plan:    Colonoscopy  Patient is appropriate for endoscopic procedure(s) in the ambulatory ([place]) setting.  The nature of the procedure, as well as the risks, benefits, and alternatives were carefully and thoroughly reviewed with the patient. Ample time for discussion and questions allowed. The patient understood, was satisfied, and agreed to proceed.     HPI: Kayla Hayes is a 20 y.o. female who presents for colonoscopy for rectal bleeding. She is still seeing some rectal bleeding and pain currently. Her last dose of her Xarelto 3 days ago. She was recently seen in clinic on 03/18/21.  Past Medical History:  Diagnosis Date   Allergy    Anxiety    Asthma    Clotting disorder ([place]) 05/2019   Pulmonary emboli ([place]) 05/2019   both lungs    Past Surgical History:  Procedure Laterality Date   MOUTH SURGERY     wisdom teeth    Prior to Admission medications   Medication Sig Start Date End Date Taking? Authorizing Provider  Ascorbic Acid (VITAMIN C PO) Take 1 tablet by mouth at bedtime.   Yes [provider]  cetirizine (ZYRTEC) 10 MG tablet Take 1 tablet (10 mg total) by mouth at bedtime. Use as needed for allergies 11/25/20  Yes Copland, Gay Filler, MD  cyclobenzaprine (FLEXERIL) 10 MG tablet TAKE 0.5-1 TABLETS (5-10 MG TOTAL) BY MOUTH 2 (TWO) TIMES DAILY AS NEEDED FOR MUSCLE SPASMS. 04/12/21   Copland, Gay Filler, MD  levonorgestrel (KYLEENA) 19.5 MG IUD by Intrauterine route once.   Yes [provider]  montelukast (SINGULAIR) 10 MG tablet Take 25 mg by mouth at bedtime.   Yes [provider]  venlafaxine XR (EFFEXOR-XR) 150 MG 24 hr capsule TAKE 1 CAPSULE BY MOUTH EVERY DAY 10/16/20  Yes Copland, Gay Filler, MD  VITAMIN D PO Take 1 tablet by mouth at bedtime.   Yes [provider]  albuterol (VENTOLIN HFA) 108  (90 Base) MCG/ACT inhaler Inhale 2 puffs into the lungs every 4 (four) hours as needed.    [provider]  fluticasone (FLONASE) 50 MCG/ACT nasal spray Place 2 sprays into both nostrils daily. 08/11/20   Muthersbaugh, Jarrett Soho, PA-C  methocarbamol (ROBAXIN) 500 MG tablet Take 1 tablet (500 mg total) by mouth every 8 (eight) hours as needed for muscle spasms. 06/15/20   Copland, Gay Filler, MD  rivaroxaban (XARELTO) 10 MG TABS tablet Take 1 tablet (10 mg total) by mouth daily. 03/18/21   Celso Amy, NP    Current Outpatient Medications  Medication Sig Dispense Refill   Ascorbic Acid (VITAMIN C PO) Take 1 tablet by mouth at bedtime.     cetirizine (ZYRTEC) 10 MG tablet Take 1 tablet (10 mg total) by mouth at bedtime. Use as needed for allergies 30 tablet 11   cyclobenzaprine (FLEXERIL) 10 MG tablet TAKE 0.5-1 TABLETS (5-10 MG TOTAL) BY MOUTH 2 (TWO) TIMES DAILY AS NEEDED FOR MUSCLE SPASMS. 30 tablet 0   levonorgestrel (KYLEENA) 19.5 MG IUD by Intrauterine route once.     montelukast (SINGULAIR) 10 MG tablet Take 25 mg by mouth at bedtime.     venlafaxine XR (EFFEXOR-XR) 150 MG 24 hr capsule TAKE 1 CAPSULE BY MOUTH EVERY DAY 30 capsule 11   VITAMIN D PO Take 1 tablet by mouth at bedtime.     albuterol (VENTOLIN HFA)  108 (90 Base) MCG/ACT inhaler Inhale 2 puffs into the lungs every 4 (four) hours as needed.     fluticasone (FLONASE) 50 MCG/ACT nasal spray Place 2 sprays into both nostrils daily. 9.9 g 0   methocarbamol (ROBAXIN) 500 MG tablet Take 1 tablet (500 mg total) by mouth every 8 (eight) hours as needed for muscle spasms. 30 tablet 0   rivaroxaban (XARELTO) 10 MG TABS tablet Take 1 tablet (10 mg total) by mouth daily. 90 tablet 2   Current Facility-Administered Medications  Medication Dose Route Frequency Provider Last Rate Last Admin   0.9 %  sodium chloride infusion  500 mL Intravenous Once Sharyn Creamer, MD        Allergies as of 05/10/2021   (No Known Allergies)     Family History  Problem Relation Age of Onset   Diabetes Maternal Grandmother    Diabetes Maternal Grandfather    Heart disease Paternal Grandfather    Colon cancer Neg Hx    Esophageal cancer Neg Hx    Rectal cancer Neg Hx    Stomach cancer Neg Hx     Social History   Socioeconomic History   Marital status: Single    Spouse name: Not on file   Number of children: 0   Years of education: Not on file   Highest education level: Not on file  Occupational History   Occupation: Teacher  Tobacco Use   Smoking status: Never   Smokeless tobacco: Never  Vaping Use   Vaping Use: Never used  Substance and Sexual Activity   Alcohol use: No   Drug use: No   Sexual activity: Not on file  Other Topics Concern   Not on file  Social History Narrative   Not on file   Social Determinants of Health   Financial Resource Strain: Not on file  Food Insecurity: Not on file  Transportation Needs: Not on file  Physical Activity: Not on file  Stress: Not on file  Social Connections: Not on file  Intimate Partner Violence: Not on file    Physical Exam: Vital signs in last 24 hours: BP (!) 130/95   Pulse (!) 120   Temp (!) 97.1 F (36.2 C) (Temporal)   Ht 5\' 7"  (1.702 m)   Wt 259 lb (117.5 kg)   SpO2 98%   BMI 40.57 kg/m  GEN: NAD EYE: Sclerae anicteric ENT: MMM CV: Non-tachycardic Pulm: No increased work of breathing GI: Soft, NT/ND NEURO:  Alert & Oriented   Christia Reading, MD [facility]  05/10/2021 9:37 AM

## 2021-05-10 NOTE — Op Note (Addendum)
[facility] Patient Name: Kayla Hayes Procedure Date: 05/10/2021 9:43 AM MRN: [id number] Endoscopist: Sonny Masters "Kayla Hayes ,  Age: 20 Referring MD:  Date of Birth: August 07, 2000 Gender: Female Account #: [id number] Procedure:                Colonoscopy Indications:              Hematochezia Medicines:                Monitored Anesthesia Care Procedure:                Pre-Anesthesia Assessment:                           - Prior to the procedure, a History and Physical                            was performed, and patient medications and                            allergies were reviewed. The patient's tolerance of                            previous anesthesia was also reviewed. The risks                            and benefits of the procedure and the sedation                            options and risks were discussed with the patient.                            All questions were answered, and informed consent                            was obtained. Prior Anticoagulants: The patient has                            taken Xarelto (rivaroxaban), last dose was 3 days                            prior to procedure. ASA Grade Assessment: III - A                            patient with severe systemic disease. After                            reviewing the risks and benefits, the patient was                            deemed in satisfactory condition to undergo the                            procedure.  After obtaining informed consent, the colonoscope                            was passed under direct vision. Throughout the                            procedure, the patient's blood pressure, pulse, and                            oxygen saturations were monitored continuously. The                            Olympus CF-HQ190L ([id number]) Colonoscope was                            introduced through the anus and advanced to the the                             terminal ileum. The colonoscopy was performed                            without difficulty. The patient tolerated the                            procedure well. The quality of the bowel                            preparation was good. Scope In: 9:49:55 AM Scope Out: 10:07:19 AM Scope Withdrawal Time: 0 hours 12 minutes 52 seconds  Total Procedure Duration: 0 hours 17 minutes 24 seconds  Findings:                 The terminal ileum appeared normal. Biopsies were                            taken with a cold forceps for histology.                           Localized mild inflammation characterized by                            congestion (edema) and erythema was found at the                            ileocecal valve. Biopsies were taken with a cold                            forceps for histology.                           Biopsies were taken with a cold forceps in the                            entire colon for histology.  Non-bleeding internal hemorrhoids were found during                            retroflexion. Complications:            No immediate complications. Estimated Blood Loss:     Estimated blood loss was minimal. Impression:               - The examined portion of the ileum was normal.                            Biopsied.                           - Localized mild inflammation was found at the                            ileocecal valve. Biopsied.                           - Non-bleeding internal hemorrhoids.                           - Biopsies were taken with a cold forceps for                            histology in the entire colon. Recommendation:           - Discharge patient to home (with escort).                           - It is suspected that your rectal bleeding and                            pain is due to hemorrhoids.                           - Start Anusol HC cream twice daily for 7 days.                           - Await pathology  results.                           - Okay to restart Xarelto tomorrow                           - The findings and recommendations were discussed                            with the patient. Sonny Masters "Kayla Hayes,  05/10/2021 10:15:26 AM

## 2021-05-10 NOTE — Progress Notes (Signed)
PT taken to PACU. Monitors in place. VSS. Report given to RN. 

## 2021-05-10 NOTE — Progress Notes (Signed)
Called to room to assist during endoscopic procedure.  Patient ID and intended procedure confirmed with present staff. Received instructions for my participation in the procedure from the performing physician.  

## 2021-05-11 ENCOUNTER — Other Ambulatory Visit: Payer: Self-pay

## 2021-05-11 DIAGNOSIS — K6289 Other specified diseases of anus and rectum: Secondary | ICD-10-CM

## 2021-05-11 MED ORDER — HYDROCORTISONE (PERIANAL) 2.5 % EX CREA: 1.0000 "application " | TOPICAL_CREAM | Freq: Two times a day (BID) | CUTANEOUS | 0 refills | Status: AC

## 2021-05-12 ENCOUNTER — Telehealth: Payer: Self-pay

## 2021-05-12 NOTE — Telephone Encounter (Signed)
Left message on follow up call. 

## 2021-06-01 ENCOUNTER — Encounter: Payer: Self-pay | Admitting: Family Medicine

## 2021-06-01 MED ORDER — COVID-19 AT-HOME TEST VI KIT
1.0000 | PACK | Freq: Once | 0 refills | Status: AC
Start: 2021-06-01 — End: 2021-06-01

## 2021-06-03 ENCOUNTER — Inpatient Hospital Stay (HOSPITAL_BASED_OUTPATIENT_CLINIC_OR_DEPARTMENT_OTHER): Payer: 59 | Admitting: Hematology & Oncology

## 2021-06-03 ENCOUNTER — Encounter: Payer: Self-pay | Admitting: Hematology & Oncology

## 2021-06-03 ENCOUNTER — Inpatient Hospital Stay: Payer: 59 | Attending: Hematology & Oncology

## 2021-06-03 ENCOUNTER — Other Ambulatory Visit: Payer: Self-pay

## 2021-06-03 VITALS — BP 131/82 | HR 104 | Temp 98.4°F | Resp 20 | Wt 260.8 lb

## 2021-06-03 DIAGNOSIS — Z7901 Long term (current) use of anticoagulants: Secondary | ICD-10-CM | POA: Diagnosis not present

## 2021-06-03 DIAGNOSIS — D6862 Lupus anticoagulant syndrome: Secondary | ICD-10-CM | POA: Insufficient documentation

## 2021-06-03 DIAGNOSIS — D6859 Other primary thrombophilia: Secondary | ICD-10-CM

## 2021-06-03 DIAGNOSIS — Z86711 Personal history of pulmonary embolism: Secondary | ICD-10-CM | POA: Insufficient documentation

## 2021-06-03 DIAGNOSIS — R768 Other specified abnormal immunological findings in serum: Secondary | ICD-10-CM | POA: Diagnosis not present

## 2021-06-03 DIAGNOSIS — Z79899 Other long term (current) drug therapy: Secondary | ICD-10-CM | POA: Diagnosis not present

## 2021-06-03 DIAGNOSIS — I2699 Other pulmonary embolism without acute cor pulmonale: Secondary | ICD-10-CM | POA: Diagnosis not present

## 2021-06-03 DIAGNOSIS — R760 Raised antibody titer: Secondary | ICD-10-CM

## 2021-06-03 DIAGNOSIS — Z8616 Personal history of COVID-19: Secondary | ICD-10-CM | POA: Insufficient documentation

## 2021-06-03 LAB — CBC WITH DIFFERENTIAL (CANCER CENTER ONLY)
Abs Immature Granulocytes: 0.02 10*3/uL (ref 0.00–0.07)
Basophils Absolute: 0 10*3/uL (ref 0.0–0.1)
Basophils Relative: 1 %
Eosinophils Absolute: 0 10*3/uL (ref 0.0–0.5)
Eosinophils Relative: 0 %
HCT: 40.4 % (ref 36.0–46.0)
Hemoglobin: 13.4 g/dL (ref 12.0–15.0)
Immature Granulocytes: 0 %
Lymphocytes Relative: 20 %
Lymphs Abs: 1.3 10*3/uL (ref 0.7–4.0)
MCH: 28.2 pg (ref 26.0–34.0)
MCHC: 33.2 g/dL (ref 30.0–36.0)
MCV: 85.1 fL (ref 80.0–100.0)
Monocytes Absolute: 0.4 10*3/uL (ref 0.1–1.0)
Monocytes Relative: 6 %
Neutro Abs: 4.9 10*3/uL (ref 1.7–7.7)
Neutrophils Relative %: 73 %
Platelet Count: 281 10*3/uL (ref 150–400)
RBC: 4.75 MIL/uL (ref 3.87–5.11)
RDW: 12.3 % (ref 11.5–15.5)
WBC Count: 6.6 10*3/uL (ref 4.0–10.5)
nRBC: 0 % (ref 0.0–0.2)

## 2021-06-03 LAB — CMP (CANCER CENTER ONLY)
ALT: 20 U/L (ref 0–44)
AST: 15 U/L (ref 15–41)
Albumin: 4.2 g/dL (ref 3.5–5.0)
Alkaline Phosphatase: 75 U/L (ref 38–126)
Anion gap: 8 (ref 5–15)
BUN: 13 mg/dL (ref 6–20)
CO2: 27 mmol/L (ref 22–32)
Calcium: 9.3 mg/dL (ref 8.9–10.3)
Chloride: 106 mmol/L (ref 98–111)
Creatinine: 0.76 mg/dL (ref 0.44–1.00)
GFR, Estimated: >60 mL/min (ref >60)
Glucose, Bld: 107 mg/dL — ABNORMAL HIGH (ref 70–99)
Potassium: 3.7 mmol/L (ref 3.5–5.1)
Sodium: 141 mmol/L (ref 135–145)
Total Bilirubin: 0.3 mg/dL (ref 0.3–1.2)
Total Protein: 6.6 g/dL (ref 6.5–8.1)

## 2021-06-03 LAB — D-DIMER, QUANTITATIVE: D-Dimer, Quant: <0.27 ug/mL-FEU (ref 0.00–0.50)

## 2021-06-03 NOTE — Progress Notes (Signed)
Hematology and Oncology Follow Up Visit  Kayla Hayes [id number] [birth year] 20 y.o. 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  She looks great.  She feels great.  She is still working at a preschool.  The preschool is actually close to where I live.  She has done well on Xarelto.  It has caused heavy monthly cycles.  She really is not anemic though so she must be doing a good job getting iron into her system.  She did have a colonoscopy done on 05/10/2021.  There were no polyps.  She had biopsies taken in the ileum.  Everything looks fine.  There is no inflammation.  She did have a negative lupus anticoagulant the last time she was here 3 months ago.  She has had no cough or shortness of breath.  There is been no chest wall pain.  She has had no leg swelling.  There is been no leg pain.  Currently, I would say her performance status is probably ECOG 0.    Medications:  Allergies as of 06/03/2021   No Known Allergies      Medication List        Accurate as of June 03, 2021  9:11 AM. If you have any questions, ask your nurse or doctor.          albuterol 108 (90 Base) MCG/ACT inhaler Commonly known as: VENTOLIN HFA Inhale 2 puffs into the lungs every 4 (four) hours as needed.   cetirizine 10 MG tablet Commonly known as: ZYRTEC Take 1 tablet (10 mg total) by mouth at bedtime. Use as needed for allergies   cyclobenzaprine 10 MG tablet Commonly known as: FLEXERIL TAKE 0.5-1 TABLETS (5-10 MG TOTAL) BY MOUTH 2 (TWO) TIMES DAILY AS NEEDED FOR MUSCLE SPASMS.   fluticasone 50 MCG/ACT nasal spray Commonly known as: FLONASE Place 2 sprays into both nostrils daily.   Kayla Hayes 19.5 MG  IUD Generic drug: levonorgestrel by Intrauterine route once.   methocarbamol 500 MG tablet Commonly known as: Robaxin Take 1 tablet (500 mg total) by mouth every 8 (eight) hours as needed for muscle spasms.   montelukast 10 MG tablet Commonly known as: SINGULAIR Take 25 mg by mouth at bedtime.   rivaroxaban 10 MG Tabs tablet Commonly known as: Xarelto Take 1 tablet (10 mg total) by mouth daily.   venlafaxine XR 150 MG 24 hr capsule Commonly known as: EFFEXOR-XR TAKE 1 CAPSULE BY MOUTH EVERY DAY   VITAMIN C PO Take 1 tablet by mouth at bedtime.   VITAMIN D PO Take 1 tablet by mouth at bedtime.        Allergies: No Known Allergies  Past Medical History, Surgical history, Social history, and Family History were reviewed and updated.  Review of Systems: Review of Systems  Constitutional: Negative.   HENT: Negative.    Eyes: Negative.   Respiratory: Negative.    Gastrointestinal: Negative.   Genitourinary: Negative.   Musculoskeletal: Negative.   Skin: Negative.   Neurological: Negative.   Endo/Heme/Allergies: Negative.   Psychiatric/Behavioral: Negative.      Physical Exam:  weight is 260 lb 12.8 oz (118.3 kg). Her oral temperature is 98.4 F (36.9 C). Her blood pressure is  131/82 and her pulse is 104 (abnormal). Her respiration is 20 and oxygen saturation is 98%.   Wt Readings from Last 3 Encounters:  06/03/21 260 lb 12.8 oz (118.3 kg)  05/10/21 259 lb (117.5 kg)  03/18/21 259 lb (117.5 kg)    Physical Exam Vitals reviewed.  HENT:     Head: Normocephalic and atraumatic.  Eyes:     Pupils: Pupils are equal, round, and reactive to light.  Cardiovascular:     Rate and Rhythm: Normal rate and regular rhythm.     Heart sounds: Normal heart sounds.  Pulmonary:     Effort: Pulmonary effort is normal.     Breath sounds: Normal breath sounds.  Abdominal:     General: Bowel sounds are normal.     Palpations: Abdomen is soft.  Musculoskeletal:         General: No tenderness or deformity. Normal range of motion.     Cervical back: Normal range of motion.  Lymphadenopathy:     Cervical: No cervical adenopathy.  Skin:    General: Skin is warm and dry.     Findings: No erythema or rash.  Neurological:     Mental Status: She is alert and oriented to person, place, and time.  Psychiatric:        Behavior: Behavior normal.        Thought Content: Thought content normal.        Judgment: Judgment normal.    Lab Results  Component Value Date   WBC 6.6 06/03/2021   HGB 13.4 06/03/2021   HCT 40.4 06/03/2021   MCV 85.1 06/03/2021   PLT 281 06/03/2021   Lab Results  Component Value Date   FERRITIN 217 06/18/2019   Lab Results  Component Value Date   RETICCTPCT 1.7 06/18/2019   RBC 4.75 06/03/2021   No results found for: KPAFRELGTCHN, LAMBDASER, KAPLAMBRATIO No results found for: IGGSERUM, IGA, IGMSERUM No results found for: Ronnald Ramp, A1GS, Nelida Meuse, SPEI   Chemistry      Component Value Date/Time   NA 141 06/03/2021 0805   K 3.7 06/03/2021 0805   CL 106 06/03/2021 0805   CO2 27 06/03/2021 0805   BUN 13 06/03/2021 0805   CREATININE 0.76 06/03/2021 0805      Component Value Date/Time   CALCIUM 9.3 06/03/2021 0805   ALKPHOS 75 06/03/2021 0805   AST 15 06/03/2021 0805   ALT 20 06/03/2021 0805   BILITOT 0.3 06/03/2021 0805       Impression and Plan:   Kayla Hayes is a very pleasant 20 yo caucasian female with history of bilateral pulmonary emboli diagnosed along with Covid the same day. She was lupus anticoagulant positive and had mildly elevated anticardiolipin IgM (30).   Still have to believe that the COVID was probably the most likely reason for the pulmonary embolism.  She had no pulmonary embolism when she had a repeat CT angiogram done in 2021.  I do think we can get her off anticoagulation now.  I feel better given that the last lupus anticoagulant was negative.  I did  tell her that she did go on any long trips, we probably do need to put her on anticoagulation.  In fact, she will be going on a mission trip to [place] in July.  Her church is the same person that my wife's family has a family reunion that every spring.  I would like to get her back in late June.  At that point time, we probably will get her on anticoagulation protocol for her trip to [place]. She will continue her same regimen with Xarelto 10 mg PO to complete one full year of maintenance therapy in December 2022.  D-dimer < 0.27.  Follow-up in 6 months.  She can contact our office with any questions or concerns.   Volanda Napoleon, MD 12/15/20229:11 AM

## 2021-06-04 ENCOUNTER — Ambulatory Visit: Payer: 59 | Admitting: Family

## 2021-06-04 ENCOUNTER — Ambulatory Visit: Payer: 59 | Admitting: Hematology & Oncology

## 2021-06-04 ENCOUNTER — Other Ambulatory Visit: Payer: 59

## 2021-06-05 ENCOUNTER — Encounter: Payer: Self-pay | Admitting: Internal Medicine

## 2021-06-05 LAB — LUPUS ANTICOAGULANT PANEL
DRVVT: 87 s — ABNORMAL HIGH (ref 0.0–47.0)
PTT Lupus Anticoagulant: 35.1 s (ref 0.0–51.9)

## 2021-06-05 LAB — DRVVT CONFIRM: dRVVT Confirm: 1.8 ratio — ABNORMAL HIGH (ref 0.8–1.2)

## 2021-06-05 LAB — DRVVT MIX: dRVVT Mix: 61.8 s — ABNORMAL HIGH (ref 0.0–40.4)

## 2021-06-06 LAB — CARDIOLIPIN ANTIBODIES, IGG, IGM, IGA
Anticardiolipin IgA: <9 APL U/mL (ref 0–11)
Anticardiolipin IgG: <9 GPL U/mL (ref 0–14)
Anticardiolipin IgM: 11 MPL U/mL (ref 0–12)

## 2021-07-18 ENCOUNTER — Encounter: Payer: Self-pay | Admitting: Family Medicine

## 2021-07-19 ENCOUNTER — Other Ambulatory Visit: Payer: Self-pay | Admitting: Family Medicine

## 2021-07-19 ENCOUNTER — Telehealth: Payer: Self-pay

## 2021-07-19 ENCOUNTER — Encounter: Payer: Self-pay | Admitting: Family Medicine

## 2021-07-19 MED ORDER — OZEMPIC (0.25 OR 0.5 MG/DOSE) 2 MG/1.5ML ~~LOC~~ SOPN: 0.2500 mg | PEN_INJECTOR | SUBCUTANEOUS | 2 refills | Status: DC

## 2021-07-19 NOTE — Addendum Note (Signed)
Addended by: Lamar Blinks C on: 07/19/2021 11:26 AM   Modules accepted: Orders

## 2021-07-19 NOTE — Telephone Encounter (Signed)
Pt's insurance only covers Ozempic for Pt's that have type 2 diabetes.

## 2021-07-30 MED ORDER — WEGOVY 0.25 MG/0.5ML ~~LOC~~ SOAJ: 0.2500 mg | SUBCUTANEOUS | 1 refills | Status: DC

## 2021-09-09 ENCOUNTER — Telehealth (INDEPENDENT_AMBULATORY_CARE_PROVIDER_SITE_OTHER): Payer: 59 | Admitting: Family Medicine

## 2021-09-09 ENCOUNTER — Encounter: Payer: Self-pay | Admitting: Family Medicine

## 2021-09-09 DIAGNOSIS — R0989 Other specified symptoms and signs involving the circulatory and respiratory systems: Secondary | ICD-10-CM

## 2021-09-09 DIAGNOSIS — R062 Wheezing: Secondary | ICD-10-CM | POA: Diagnosis not present

## 2021-09-09 MED ORDER — CEFDINIR 300 MG PO CAPS: 300.0000 mg | ORAL_CAPSULE | Freq: Two times a day (BID) | ORAL | 0 refills | Status: DC

## 2021-09-09 NOTE — Progress Notes (Signed)
Therapist, music at [facility] ?[address], [address] ?[place],  [address] ?336 [phone] ?Fax [phone] ? ?Date:  09/09/2021  ? ?Name:  Kayla Hayes   DOB:  27-Mar-2001   MRN:  [id number] ? ?PCP:  Darreld Mclean, MD  ? ? ?Chief Complaint: No chief complaint on file. ? ? ?History of Present Illness: ? ?Kayla Hayes is a 21 y.o. very pleasant female patient who presents with the following: ? ?Virtual visit today for concern of illness ?Patient location is her job, my location is office.  Patient identity confirmed with 2 factors, she gives consent for virtual visit today ?The patient and myself are on the call today ? 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  She was on xarelto for 2 years total- ?She followed up with hematology, Dr. Marin Olp in [place] that time decision was made to stop anticoagulation, with plan to restart blood thinners if any unusual circumstances such as surgery or travel ?She stopped using these mid January  ? ?Today pt notes she has had a sinus infection/cold for about 3 weeks ?People have been sick at her job ?She was seen at an urgent care and given amoxicillin- she started on this 2 days ago.  However, if anything she feels worse ?Her current sx include cough, stuffy and runny nose, pressure behind her eyes in her sinuses, her ears are popping and feel congested ?The cough can be productive- this got worse today ?She has not noted a fever ?She has some body aches- esp this am  ?No vomiting or diarrhea ?She did not test for covid so far ? ?Albuterol did help her this am -she does not typically have to use her inhaler ?She does not feel that she is in distress, does not wish to visit the ER at this time.  However she is willing to be seen in person tomorrow ? ? ?Patient Active Problem List  ?  Diagnosis Date Noted  ? Bilateral pulmonary embolism ([place]) 05/24/2019  ? COVID-19 virus infection 05/24/2019  ? Thrombocytopenia ([place]) 05/24/2019  ? Anxiety 11/29/2018  ? Asthma 08/08/2012  ? ? ?Past Medical History:  ?Diagnosis Date  ? Allergy   ? Anxiety   ? Asthma   ? Clotting disorder ([place]) 05/2019  ? Pulmonary emboli ([place]) 05/2019  ? both lungs  ? ? ?Past Surgical History:  ?Procedure Laterality Date  ? MOUTH SURGERY    ? wisdom teeth  ? ? ?Social History  ? ?Tobacco Use  ? Smoking status: Never  ? Smokeless tobacco: Never  ?Vaping Use  ? Vaping Use: Never used  ?Substance Use Topics  ? Alcohol use: No  ? Drug use: No  ? ? ?Family History  ?Problem Relation Age of Onset  ? Diabetes Maternal Grandmother   ? Diabetes Maternal Grandfather   ? Heart disease Paternal Grandfather   ? Colon cancer Neg Hx   ? Esophageal cancer Neg Hx   ? Rectal cancer Neg Hx   ? Stomach cancer Neg Hx   ? ? ?No Known Allergies ? ?Medication list has been reviewed and updated. ? ?Current Outpatient Medications on File Prior to Visit  ?Medication Sig Dispense Refill  ? albuterol (VENTOLIN HFA) 108 (90 Base) MCG/ACT inhaler Inhale 2 puffs into the lungs every 4 (four) hours as needed.    ?  Ascorbic Acid (VITAMIN C PO) Take 1 tablet by mouth at bedtime.    ? cetirizine (ZYRTEC) 10 MG tablet Take 1 tablet (10 mg total) by mouth at bedtime. Use as needed for allergies 30 tablet 11  ? cyclobenzaprine (FLEXERIL) 10 MG tablet TAKE 0.5-1 TABLETS (5-10 MG TOTAL) BY MOUTH 2 (TWO) TIMES DAILY AS NEEDED FOR MUSCLE SPASMS. 30 tablet 0  ? fluticasone (FLONASE) 50 MCG/ACT nasal spray Place 2 sprays into both nostrils daily. 9.9 g 0  ? levonorgestrel (KYLEENA) 19.5 MG IUD by Intrauterine route once.    ? methocarbamol (ROBAXIN) 500 MG tablet Take 1 tablet (500 mg total) by mouth every 8 (eight) hours as needed for muscle spasms. 30 tablet 0  ? montelukast (SINGULAIR) 10 MG tablet Take 25 mg by mouth at bedtime.    ? rivaroxaban (XARELTO) 10 MG TABS  tablet Take 1 tablet (10 mg total) by mouth daily. 90 tablet 2  ? Semaglutide-Weight Management (WEGOVY) 0.25 MG/0.5ML SOAJ Inject 0.25 mg into the skin once a week. 2 mL 1  ? venlafaxine XR (EFFEXOR-XR) 150 MG 24 hr capsule TAKE 1 CAPSULE BY MOUTH EVERY DAY 30 capsule 11  ? VITAMIN D PO Take 1 tablet by mouth at bedtime.    ? ?No current facility-administered medications on file prior to visit.  ? ? ?Review of Systems: ? ?As per HPI- otherwise negative. ? ? ?Physical Examination: ?There were no vitals filed for this visit. ?There were no vitals filed for this visit. ?There is no height or weight on file to calculate BMI. ?Ideal Body Weight:   ? ?Patient observed via video monitor.  She looks well, her normal self.  No wheezing or distress is noted ? ?Assessment and Plan: ?Chest congestion - Plan: cefdinir (OMNICEF) 300 MG capsule ? ?Wheezing ? ?Patient seen today with concern of chest congestion, sinus congestion and wheezing.  She started on amoxicillin 2 days ago but feels that she is getting worse ?We will change her over to [facility] ?Continue albuterol as needed ?I asked her to take her home COVID test tonight, let me know if positive ? ?We made a plan for her to see my partner Jodi Mourning tomorrow-especially given history of PE related to COVID-19 would like to have her seen in person to check vital signs and her general condition ? ?If getting worse in the meantime she is asked to seek care at the emergency room ? ?Signed ?Lamar Blinks, MD ? ?

## 2021-09-10 ENCOUNTER — Other Ambulatory Visit: Payer: Self-pay

## 2021-09-10 ENCOUNTER — Ambulatory Visit: Payer: 59 | Admitting: Family

## 2021-09-10 ENCOUNTER — Ambulatory Visit (HOSPITAL_BASED_OUTPATIENT_CLINIC_OR_DEPARTMENT_OTHER)
Admission: RE | Admit: 2021-09-10 | Discharge: 2021-09-10 | Disposition: A | Discharge status: Home / Self Care | Payer: 59 | Source: Ambulatory Visit | Attending: Family | Admitting: Family

## 2021-09-10 ENCOUNTER — Encounter: Payer: Self-pay | Admitting: Family

## 2021-09-10 VITALS — BP 132/84 | HR 112 | Temp 98.9°F | Ht 67.0 in | Wt 264.8 lb

## 2021-09-10 DIAGNOSIS — R079 Chest pain, unspecified: Secondary | ICD-10-CM

## 2021-09-10 DIAGNOSIS — R051 Acute cough: Secondary | ICD-10-CM

## 2021-09-10 DIAGNOSIS — I2699 Other pulmonary embolism without acute cor pulmonale: Secondary | ICD-10-CM

## 2021-09-10 MED ORDER — PREDNISONE 20 MG PO TABS: 20.0000 mg | ORAL_TABLET | Freq: Every day | ORAL | 0 refills | Status: DC

## 2021-09-10 NOTE — Progress Notes (Signed)
?Kayla Hayes is a 21 y.o. female with the following history as recorded in EpicCare:  ?Patient Active Problem List  ? Diagnosis Date Noted  ? Bilateral pulmonary embolism ([place]) 05/24/2019  ? COVID-19 virus infection 05/24/2019  ? Thrombocytopenia ([place]) 05/24/2019  ? Anxiety 11/29/2018  ? Asthma 08/08/2012  ?  ?Current Outpatient Medications  ?Medication Sig Dispense Refill  ? albuterol (VENTOLIN HFA) 108 (90 Base) MCG/ACT inhaler Inhale 2 puffs into the lungs every 4 (four) hours as needed.    ? Ascorbic Acid (VITAMIN C PO) Take 1 tablet by mouth at bedtime.    ? cefdinir (OMNICEF) 300 MG capsule Take 1 capsule (300 mg total) by mouth 2 (two) times daily. 20 capsule 0  ? cetirizine (ZYRTEC) 10 MG tablet Take 1 tablet (10 mg total) by mouth at bedtime. Use as needed for allergies 30 tablet 11  ? levonorgestrel (KYLEENA) 19.5 MG IUD by Intrauterine route once.    ? montelukast (SINGULAIR) 10 MG tablet Take 25 mg by mouth at bedtime.    ? predniSONE (DELTASONE) 20 MG tablet Take 1 tablet (20 mg total) by mouth daily with breakfast. 5 tablet 0  ? venlafaxine XR (EFFEXOR-XR) 150 MG 24 hr capsule TAKE 1 CAPSULE BY MOUTH EVERY DAY 30 capsule 11  ? VITAMIN D PO Take 1 tablet by mouth at bedtime.    ? methocarbamol (ROBAXIN) 500 MG tablet Take 1 tablet (500 mg total) by mouth every 8 (eight) hours as needed for muscle spasms. (Patient not taking: Reported on 09/10/2021) 30 tablet 0  ? ?No current facility-administered medications for this visit.  ?  ?Allergies: Patient has no known allergies.  ?Past Medical History:  ?Diagnosis Date  ? Allergy   ? Anxiety   ? Asthma   ? Clotting disorder ([place]) 05/2019  ? Pulmonary emboli ([place]) 05/2019  ? both lungs  ?  ?Past Surgical History:  ?Procedure Laterality Date  ? MOUTH SURGERY    ? wisdom teeth  ?  ?Family History  ?Problem Relation Age of Onset  ? Diabetes Maternal Grandmother   ? Diabetes Maternal Grandfather   ? Heart disease Paternal Grandfather   ? Colon cancer Neg Hx   ?  Esophageal cancer Neg Hx   ? Rectal cancer Neg Hx   ? Stomach cancer Neg Hx   ?  ?Social History  ? ?Tobacco Use  ? Smoking status: Never  ? Smokeless tobacco: Never  ?Substance Use Topics  ? Alcohol use: No  ?  ?Subjective:  ? ?3 week history of cough/ congestion; did virtual visit with her PCP yesterday and was changed to [facility]; has been using her albuterol more regularly in the past 24 hours as well; notes that upper chest "just feels sore/ painful." Due to history of PE 2 years ago ( thought to be COVID related but no definite source found), recommended in person OV;  ? ? ?Objective:  ?Vitals:  ? 09/10/21 1250  ?BP: 132/84  ?Pulse: (!) 112  ?Temp: 98.9 ?F (37.2 ?C)  ?TempSrc: Oral  ?SpO2: 98%  ?Weight: 264 lb 12.8 oz (120.1 kg)  ?Height: '5\' 7"'$  (1.702 m)  ?  ?General: Well developed, well nourished, in no acute distress  ?Skin : Warm and dry.  ?Head: Normocephalic and atraumatic  ?Eyes: Sclera and conjunctiva clear; pupils round and reactive to light; extraocular movements intact  ?Ears: External normal; canals clear; tympanic membranes normal  ?Oropharynx: Pink, supple. No suspicious lesions  ?Neck: Supple without thyromegaly, adenopathy  ?  Lungs: Respirations unlabored; clear to auscultation bilaterally without wheeze, rales, rhonchi  ?CVS exam: normal rate and regular rhythm.  ?Neurologic: Alert and oriented; speech intact; face symmetrical; moves all extremities well; CNII-XII intact without focal deficit  ? ?Assessment:  ?1. Acute cough   ?2. Chest pain, unspecified type   ?3. Bilateral pulmonary embolism ([place])   ?  ?Plan:  ?Update CXR today- normal; continue Omnicef that was prescribed yesterday; will add low dose prednisone 20 mg qd x 5 days; ( has IUD in place); continue albuterol prn; will also check d-dimer today due to history of PE; strict ER precautions for upcoming weekend;  ? ?This visit occurred during the SARS-CoV-2 public health emergency.  Safety protocols were in place, including screening  questions prior to the visit, additional usage of staff PPE, and extensive cleaning of exam room while observing appropriate contact time as indicated for disinfecting solutions.  ? ? ?No follow-ups on file.  ?Orders Placed This Encounter  ?Procedures  ? DG Chest 2 View  ?  Standing Status:   Future  ?  Number of Occurrences:   1  ?  Standing Expiration Date:   09/11/2022  ?  Order Specific Question:   Reason for Exam (SYMPTOM  OR DIAGNOSIS REQUIRED)  ?  Answer:   cough/ chest pain  ?  Order Specific Question:   Is patient pregnant?  ?  Answer:   No  ?  Order Specific Question:   Preferred imaging location?  ?  Answer:   [facility]  ? D-Dimer, Quantitative  ?  ?Requested Prescriptions  ? ?Signed Prescriptions Disp Refills  ? predniSONE (DELTASONE) 20 MG tablet 5 tablet 0  ?  Sig: Take 1 tablet (20 mg total) by mouth daily with breakfast.  ?  ? ?

## 2021-09-11 LAB — D-DIMER, QUANTITATIVE: D-Dimer, Quant: 0.39 mcg/mL FEU (ref <0.50)

## 2021-10-04 ENCOUNTER — Encounter: Payer: Self-pay | Admitting: Family Medicine

## 2021-10-13 ENCOUNTER — Other Ambulatory Visit: Payer: Self-pay | Admitting: Family Medicine

## 2021-10-13 DIAGNOSIS — F411 Generalized anxiety disorder: Secondary | ICD-10-CM

## 2021-11-01 MED ORDER — CETIRIZINE HCL 10 MG PO TABS: 10.0000 mg | ORAL_TABLET | Freq: Every day | ORAL | 3 refills | Status: DC

## 2021-11-01 MED ORDER — MONTELUKAST SODIUM 10 MG PO TABS: 10.0000 mg | ORAL_TABLET | Freq: Every day | ORAL | 3 refills | Status: DC

## 2021-11-09 ENCOUNTER — Other Ambulatory Visit: Payer: Self-pay | Admitting: Family Medicine

## 2021-11-09 DIAGNOSIS — F411 Generalized anxiety disorder: Secondary | ICD-10-CM

## 2021-11-16 ENCOUNTER — Encounter: Payer: Self-pay | Admitting: Family Medicine

## 2021-12-09 ENCOUNTER — Inpatient Hospital Stay: Payer: 59 | Attending: Family

## 2021-12-09 ENCOUNTER — Inpatient Hospital Stay: Payer: 59 | Admitting: Hematology & Oncology

## 2022-01-30 ENCOUNTER — Encounter: Payer: Self-pay | Admitting: Family Medicine

## 2022-02-01 NOTE — Progress Notes (Unsigned)
[place] at [facility] [address], [place], [place] [address] [phone]  Date:  02/02/2022   Name:  Kayla Hayes   DOB:  11/23/2000   MRN:  [id number]  PCP:  Darreld Mclean, MD    Chief Complaint: No chief complaint on file.   History of Present Illness:  Kayla Hayes is a 21 y.o. very pleasant female patient who presents with the following:  Virtual visit today to discuss headaches Most recent visit with myself was in March Patient location is home, my location is office.  Patient identity confirmed with 2 factors, she gives consent for virtual visit today.  The patient and myself are present on the call today  History of pulmonary embolism in the setting of COVID-19 infection and lupus anticoagulant positive/elevated cardiolipin IgM-she was treated with Xarelto for 2 years, this was discontinued in January 2023.  She reached out to me recently with concern of worsening headaches: for the last month or so, i've started getting migraines. they're almost crippling, making it where i can barely keep my eyes open. it starts where my vision in one eye starts to get blurry and jagged, goes dark, and  i lose most of my straight ahead vision (i can still see somewhat in peripheral). then, my head pounds! i feel like i've tried everything. i started wearing my glasses everyday, i drink as much water as possible, and i've tried every OTC medication (including Excederine Migraine) to help and it barely touches them. I've also tried both cold and warm compresses, and pressure points to help alleviate them to no avail.    She has noted a visual aura which lasts about 20- 30 minutes and then the headache will start The aura seems to transfer from 1 eye to the other She may get a headache 2 or 3 times a week She would describe the HA as pounding/ throbbing and just on one side of her head Some nausea but no vomiting She does not note  much in the way of phono or photophobia Using excedrin or other OTC pain meds is not helping much If she lays down and goes to sleep the headache will usually resolve  She has lost 40 lbs with [employer] [facility] She is using supplements but no prescription medications  She has an IUD, no estrogen used Patient Active Problem List   Diagnosis Date Noted   Bilateral pulmonary embolism ([place]) 05/24/2019   COVID-19 virus infection 05/24/2019   Thrombocytopenia ([place]) 05/24/2019   Anxiety 11/29/2018   Asthma 08/08/2012    Past Medical History:  Diagnosis Date   Allergy    Anxiety    Asthma    Clotting disorder ([place]) 05/2019   Pulmonary emboli ([place]) 05/2019   both lungs    Past Surgical History:  Procedure Laterality Date   MOUTH SURGERY     wisdom teeth    Social History   Tobacco Use   Smoking status: Never   Smokeless tobacco: Never  Vaping Use   Vaping Use: Never used  Substance Use Topics   Alcohol use: No   Drug use: No    Family History  Problem Relation Age of Onset   Diabetes Maternal Grandmother    Diabetes Maternal Grandfather    Heart disease Paternal Grandfather    Colon cancer Neg Hx    Esophageal cancer Neg Hx    Rectal cancer Neg Hx  Stomach cancer Neg Hx     No Known Allergies  Medication list has been reviewed and updated.  Current Outpatient Medications on File Prior to Visit  Medication Sig Dispense Refill   albuterol (VENTOLIN HFA) 108 (90 Base) MCG/ACT inhaler Inhale 2 puffs into the lungs every 4 (four) hours as needed.     Ascorbic Acid (VITAMIN C PO) Take 1 tablet by mouth at bedtime.     cefdinir (OMNICEF) 300 MG capsule Take 1 capsule (300 mg total) by mouth 2 (two) times daily. 20 capsule 0   cetirizine (ZYRTEC) 10 MG tablet Take 1 tablet (10 mg total) by mouth at bedtime. Use as needed for allergies 90 tablet 3   levonorgestrel (KYLEENA) 19.5 MG IUD by Intrauterine route once.     methocarbamol (ROBAXIN) 500 MG tablet Take 1 tablet  (500 mg total) by mouth every 8 (eight) hours as needed for muscle spasms. (Patient not taking: Reported on 09/10/2021) 30 tablet 0   montelukast (SINGULAIR) 10 MG tablet Take 1 tablet (10 mg total) by mouth at bedtime. 90 tablet 3   predniSONE (DELTASONE) 20 MG tablet Take 1 tablet (20 mg total) by mouth daily with breakfast. 5 tablet 0   venlafaxine XR (EFFEXOR-XR) 150 MG 24 hr capsule TAKE 1 CAPSULE BY MOUTH EVERY DAY 90 capsule 4   VITAMIN D PO Take 1 tablet by mouth at bedtime.     No current facility-administered medications on file prior to visit.    Review of Systems:  As per HPI- otherwise negative.   Physical Examination: There were no vitals filed for this visit. There were no vitals filed for this visit. There is no height or weight on file to calculate BMI. Ideal Body Weight:    Patient observed via video monitor, she looks well and her normal self She is not checking vital signs at home She denies any other neurologic symptoms such as slurred speech, facial drooping, weakness or numbness of any extremity  Assessment and Plan: Migraine with aura and without status migrainosus, not intractable - Plan: SUMAtriptan (IMITREX) 100 MG tablet Patient seen today for virtual visit, symptoms consistent with migraine headache with aura She is not currently on any medication for same She did not have migraines in her younger years, but she notes her mother does have history of migraine We will have her start on sumatriptan, I have asked her to let me know how this works for her.  If it is not effective we can try different agent, but we might also want to pursue further evaluation such as imaging in that case.  We went over how to use sumatriptan I did ask her to also arrange an eye exam ASAP to rule out any ocular abnormality  Signed Lamar Blinks, MD

## 2022-02-02 ENCOUNTER — Telehealth (INDEPENDENT_AMBULATORY_CARE_PROVIDER_SITE_OTHER): Payer: 59 | Admitting: Family Medicine

## 2022-02-02 DIAGNOSIS — G43109 Migraine with aura, not intractable, without status migrainosus: Secondary | ICD-10-CM | POA: Diagnosis not present

## 2022-02-02 MED ORDER — SUMATRIPTAN SUCCINATE 100 MG PO TABS: 100.0000 mg | ORAL_TABLET | ORAL | 2 refills | Status: DC | PRN

## 2022-02-25 NOTE — Progress Notes (Unsigned)
[name] at [facility] [address] [place], [place] [address] [phone]  Date:  02/28/2022   Name:  Kayla Hayes   DOB:  Oct 21, 2000   MRN:  [id number]  PCP:  Darreld Mclean, MD    Chief Complaint: No chief complaint on file.   History of Present Illness:  Kayla Hayes is a 21 y.o. very pleasant female patient who presents with the following:  Patient seen today for physical exam History of pulmonary embolism in the setting of COVID-19 infection and lupus anticoagulant positive/elevated cardiolipin IgM-she was treated with Xarelto for 2 years, this was discontinued in January 2023.  Most recent visit with myself was a virtual visit in August for migraine headache At that time prescribed Imitrex to use as needed for migraine with aura.  Can update HIV and hepatitis C screening Pap smear Flu shot Lab work done in January 2022, CMP, CBC, hypercoag labs Can update lab work today if she would like Patient Active Problem List   Diagnosis Date Noted   Bilateral pulmonary embolism ([place]) 05/24/2019   COVID-19 virus infection 05/24/2019   Thrombocytopenia ([place]) 05/24/2019   Anxiety 11/29/2018   Asthma 08/08/2012    Past Medical History:  Diagnosis Date   Allergy    Anxiety    Asthma    Clotting disorder ([place]) 05/2019   Pulmonary emboli ([place]) 05/2019   both lungs    Past Surgical History:  Procedure Laterality Date   MOUTH SURGERY     wisdom teeth    Social History   Tobacco Use   Smoking status: Never   Smokeless tobacco: Never  Vaping Use   Vaping Use: Never used  Substance Use Topics   Alcohol use: No   Drug use: No    Family History  Problem Relation Age of Onset   Diabetes Maternal Grandmother    Diabetes Maternal Grandfather    Heart disease Paternal Grandfather    Colon cancer Neg Hx    Esophageal cancer Neg Hx    Rectal cancer Neg Hx    Stomach cancer Neg Hx     No Known  Allergies  Medication list has been reviewed and updated.  Current Outpatient Medications on File Prior to Visit  Medication Sig Dispense Refill   albuterol (VENTOLIN HFA) 108 (90 Base) MCG/ACT inhaler Inhale 2 puffs into the lungs every 4 (four) hours as needed.     Ascorbic Acid (VITAMIN C PO) Take 1 tablet by mouth at bedtime.     cefdinir (OMNICEF) 300 MG capsule Take 1 capsule (300 mg total) by mouth 2 (two) times daily. 20 capsule 0   cetirizine (ZYRTEC) 10 MG tablet Take 1 tablet (10 mg total) by mouth at bedtime. Use as needed for allergies 90 tablet 3   levonorgestrel (KYLEENA) 19.5 MG IUD by Intrauterine route once.     methocarbamol (ROBAXIN) 500 MG tablet Take 1 tablet (500 mg total) by mouth every 8 (eight) hours as needed for muscle spasms. 30 tablet 0   montelukast (SINGULAIR) 10 MG tablet Take 1 tablet (10 mg total) by mouth at bedtime. 90 tablet 3   predniSONE (DELTASONE) 20 MG tablet Take 1 tablet (20 mg total) by mouth daily with breakfast. 5 tablet 0   SUMAtriptan (IMITREX) 100 MG tablet Take 1 tablet (100 mg total) by mouth every 2 (two) hours as needed for migraine. May repeat in 2 hours if headache persists or recurs.  Max 200 mg in 24 hours 20 tablet 2   venlafaxine XR (EFFEXOR-XR) 150 MG 24 hr capsule TAKE 1 CAPSULE BY MOUTH EVERY DAY 90 capsule 4   VITAMIN D PO Take 1 tablet by mouth at bedtime.     No current facility-administered medications on file prior to visit.    Review of Systems:  As per HPI- otherwise negative.   Physical Examination: There were no vitals filed for this visit. There were no vitals filed for this visit. There is no height or weight on file to calculate BMI. Ideal Body Weight:    GEN: no acute distress. HEENT: Atraumatic, Normocephalic.  Ears and Nose: No external deformity. CV: RRR, No M/G/R. No JVD. No thrill. No extra heart sounds. PULM: CTA B, no wheezes, crackles, rhonchi. No retractions. No resp. distress. No accessory  muscle use. ABD: S, NT, ND, +BS. No rebound. No HSM. EXTR: No c/c/e PSYCH: Normally interactive. Conversant.    Assessment and Plan: *** Physical exam today.  Encouraged healthy diet and exercise routine Will plan further follow- up pending labs.  Signed Lamar Blinks, MD

## 2022-02-25 NOTE — Patient Instructions (Incomplete)
It was great to see you again today! Best of luck with school and work I will be in touch with your labs asap For the mild hidradenitis suppurativa- try to keep problem areas as dry, cool, and friction free as you can. After exercise or sweating get into dry clothes as soon as you can. Perhaps try some powder or corn starch to reduce friction and encourage dryness.  If you would like eo see dermatology at any point for this please let me know Diflucan for presumed yeast infection

## 2022-02-28 ENCOUNTER — Ambulatory Visit (INDEPENDENT_AMBULATORY_CARE_PROVIDER_SITE_OTHER): Payer: 59 | Admitting: Family Medicine

## 2022-02-28 ENCOUNTER — Encounter: Payer: Self-pay | Admitting: Family Medicine

## 2022-02-28 VITALS — BP 110/66 | HR 96 | Temp 98.7°F | Ht 67.0 in | Wt 236.0 lb

## 2022-02-28 DIAGNOSIS — R5383 Other fatigue: Secondary | ICD-10-CM | POA: Diagnosis not present

## 2022-02-28 DIAGNOSIS — Z23 Encounter for immunization: Secondary | ICD-10-CM

## 2022-02-28 DIAGNOSIS — Z131 Encounter for screening for diabetes mellitus: Secondary | ICD-10-CM | POA: Diagnosis not present

## 2022-02-28 DIAGNOSIS — N898 Other specified noninflammatory disorders of vagina: Secondary | ICD-10-CM

## 2022-02-28 DIAGNOSIS — R300 Dysuria: Secondary | ICD-10-CM

## 2022-02-28 DIAGNOSIS — I2699 Other pulmonary embolism without acute cor pulmonale: Secondary | ICD-10-CM | POA: Diagnosis not present

## 2022-02-28 DIAGNOSIS — Z130 Encounter for screening for diseases of the blood and blood-forming organs and certain disorders involving the immune mechanism: Secondary | ICD-10-CM

## 2022-02-28 DIAGNOSIS — G43109 Migraine with aura, not intractable, without status migrainosus: Secondary | ICD-10-CM | POA: Diagnosis not present

## 2022-02-28 DIAGNOSIS — Z13220 Encounter for screening for lipoid disorders: Secondary | ICD-10-CM

## 2022-02-28 DIAGNOSIS — Z0000 Encounter for general adult medical examination without abnormal findings: Secondary | ICD-10-CM | POA: Diagnosis not present

## 2022-02-28 DIAGNOSIS — Z1329 Encounter for screening for other suspected endocrine disorder: Secondary | ICD-10-CM

## 2022-02-28 LAB — COMPREHENSIVE METABOLIC PANEL
ALT: 13 U/L (ref 0–35)
AST: 11 U/L (ref 0–37)
Albumin: 4.1 g/dL (ref 3.5–5.2)
Alkaline Phosphatase: 65 U/L (ref 39–117)
BUN: 9 mg/dL (ref 6–23)
CO2: 24 mEq/L (ref 19–32)
Calcium: 9.1 mg/dL (ref 8.4–10.5)
Chloride: 106 mEq/L (ref 96–112)
Creatinine, Ser: 0.8 mg/dL (ref 0.40–1.20)
GFR: 104.4 mL/min (ref >60.00)
Glucose, Bld: 96 mg/dL (ref 70–99)
Potassium: 3.8 mEq/L (ref 3.5–5.1)
Sodium: 140 mEq/L (ref 135–145)
Total Bilirubin: 0.3 mg/dL (ref 0.2–1.2)
Total Protein: 6.4 g/dL (ref 6.0–8.3)

## 2022-02-28 LAB — CBC
HCT: 40.6 % (ref 36.0–46.0)
Hemoglobin: 13.8 g/dL (ref 12.0–15.0)
MCHC: 34.1 g/dL (ref 30.0–36.0)
MCV: 86.5 fl (ref 78.0–100.0)
Platelets: 258 10*3/uL (ref 150.0–400.0)
RBC: 4.69 Mil/uL (ref 3.87–5.11)
RDW: 12.7 % (ref 11.5–15.5)
WBC: 6.7 10*3/uL (ref 4.0–10.5)

## 2022-02-28 LAB — LIPID PANEL
Cholesterol: 201 mg/dL — ABNORMAL HIGH (ref 0–200)
HDL: 50.4 mg/dL (ref >39.00)
NonHDL: 150.24
Total CHOL/HDL Ratio: 4
Triglycerides: 203 mg/dL — ABNORMAL HIGH (ref 0.0–149.0)
VLDL: 40.6 mg/dL — ABNORMAL HIGH (ref 0.0–40.0)

## 2022-02-28 LAB — LDL CHOLESTEROL, DIRECT: Direct LDL: 134 mg/dL

## 2022-02-28 LAB — TSH: TSH: 1.76 u[IU]/mL (ref 0.35–5.50)

## 2022-02-28 LAB — HEMOGLOBIN A1C: Hgb A1c MFr Bld: 5.2 % (ref 4.6–6.5)

## 2022-02-28 LAB — VITAMIN D 25 HYDROXY (VIT D DEFICIENCY, FRACTURES): VITD: 22.61 ng/mL — ABNORMAL LOW (ref 30.00–100.00)

## 2022-02-28 MED ORDER — FLUCONAZOLE 150 MG PO TABS: 150.0000 mg | ORAL_TABLET | Freq: Once | ORAL | 0 refills | Status: AC

## 2022-03-01 LAB — TIQ-NTM

## 2022-03-01 LAB — SURESWAB® ADVANCED CANDIDA VAGINITIS (CV), TMA

## 2022-03-01 LAB — SURESWAB® ADVANCED BACTERIAL VAGINOSIS (BV), TMA

## 2022-03-02 ENCOUNTER — Encounter: Payer: Self-pay | Admitting: Family Medicine

## 2022-03-02 LAB — URINE CULTURE
MICRO NUMBER:: 13898807
SPECIMEN QUALITY:: ADEQUATE

## 2022-03-02 MED ORDER — CEPHALEXIN 500 MG PO CAPS: 500.0000 mg | ORAL_CAPSULE | Freq: Two times a day (BID) | ORAL | 0 refills | Status: DC

## 2022-03-02 NOTE — Addendum Note (Signed)
Addended by: Lamar Blinks C on: 03/02/2022 08:54 AM   Modules accepted: Orders

## 2022-03-07 ENCOUNTER — Encounter: Payer: Self-pay | Admitting: Family Medicine

## 2022-07-25 ENCOUNTER — Encounter (INDEPENDENT_AMBULATORY_CARE_PROVIDER_SITE_OTHER): Payer: 59 | Admitting: Family Medicine

## 2022-07-25 DIAGNOSIS — J0110 Acute frontal sinusitis, unspecified: Secondary | ICD-10-CM

## 2022-07-25 MED ORDER — AMOXICILLIN 500 MG PO CAPS: 1000.0000 mg | ORAL_CAPSULE | Freq: Two times a day (BID) | ORAL | 0 refills | Status: DC

## 2022-07-25 NOTE — Telephone Encounter (Signed)

## 2022-08-02 MED ORDER — FLUCONAZOLE 150 MG PO TABS: 150.0000 mg | ORAL_TABLET | Freq: Every day | ORAL | 0 refills | Status: DC

## 2022-08-02 NOTE — Addendum Note (Signed)
Addended by: Lamar Blinks C on: 08/02/2022 12:09 PM   Modules accepted: Orders

## 2022-08-12 ENCOUNTER — Encounter: Payer: Self-pay | Admitting: Family

## 2022-08-12 ENCOUNTER — Ambulatory Visit: Payer: 59 | Admitting: Family

## 2022-08-12 VITALS — BP 118/82 | HR 115 | Resp 18 | Ht 67.0 in | Wt 266.0 lb

## 2022-08-12 DIAGNOSIS — J329 Chronic sinusitis, unspecified: Secondary | ICD-10-CM | POA: Diagnosis not present

## 2022-08-12 MED ORDER — DOXYCYCLINE HYCLATE 100 MG PO TABS: 100.0000 mg | ORAL_TABLET | Freq: Two times a day (BID) | ORAL | 0 refills | Status: DC

## 2022-08-12 MED ORDER — PREDNISONE 20 MG PO TABS: ORAL_TABLET | ORAL | 0 refills | Status: DC

## 2022-08-12 NOTE — Progress Notes (Signed)
Kayla Hayes is a 22 y.o. female with the following history as recorded in EpicCare:  Patient Active Problem List   Diagnosis Date Noted   Bilateral pulmonary embolism ([place]) 05/24/2019   COVID-19 virus infection 05/24/2019   Thrombocytopenia ([place]) 05/24/2019   Anxiety 11/29/2018   Asthma 08/08/2012    Current Outpatient Medications  Medication Sig Dispense Refill   albuterol (VENTOLIN HFA) 108 (90 Base) MCG/ACT inhaler Inhale 2 puffs into the lungs every 4 (four) hours as needed.     cetirizine (ZYRTEC) 10 MG tablet Take 1 tablet (10 mg total) by mouth at bedtime. Use as needed for allergies 90 tablet 3   doxycycline (VIBRA-TABS) 100 MG tablet Take 1 tablet (100 mg total) by mouth 2 (two) times daily. 14 tablet 0   levonorgestrel (KYLEENA) 19.5 MG IUD by Intrauterine route once.     methocarbamol (ROBAXIN) 500 MG tablet Take 1 tablet (500 mg total) by mouth every 8 (eight) hours as needed for muscle spasms. 30 tablet 0   montelukast (SINGULAIR) 10 MG tablet Take 1 tablet (10 mg total) by mouth at bedtime. 90 tablet 3   predniSONE (DELTASONE) 20 MG tablet Take 2 tablets daily x 2 days, then 1 tablet x 5 days 9 tablet 0   SUMAtriptan (IMITREX) 100 MG tablet Take 1 tablet (100 mg total) by mouth every 2 (two) hours as needed for migraine. May repeat in 2 hours if headache persists or recurs.  Max 200 mg in 24 hours 20 tablet 2   venlafaxine XR (EFFEXOR-XR) 150 MG 24 hr capsule TAKE 1 CAPSULE BY MOUTH EVERY DAY 90 capsule 4   No current facility-administered medications for this visit.    Allergies: Patient has no known allergies.  Past Medical History:  Diagnosis Date   Allergy    Anxiety    Asthma    Clotting disorder ([place]) 05/2019   Pulmonary emboli ([place]) 05/2019   both lungs    Past Surgical History:  Procedure Laterality Date   MOUTH SURGERY     wisdom teeth    Family History  Problem Relation Age of Onset   Diabetes Maternal Grandmother    Diabetes Maternal Grandfather     Heart disease Paternal Grandfather    Colon cancer Neg Hx    Esophageal cancer Neg Hx    Rectal cancer Neg Hx    Stomach cancer Neg Hx     Social History   Tobacco Use   Smoking status: Never   Smokeless tobacco: Never  Substance Use Topics   Alcohol use: No    Subjective:   Treated for sinus infection with Amoxicillin x 10 days for suspected infection earlier this month; felt better while on medication but felt that symptoms re-flared as soon as she finished the prescription; notes that she struggles with chronic/ recurrent sinus infection;     Objective:  Vitals:   08/12/22 1456  BP: 118/82  Pulse: (!) 115  Resp: 18  SpO2: 98%  Weight: 266 lb (120.7 kg)  Height: '5\' 7"'$  (1.702 m)    General: Well developed, well nourished, in no acute distress  Skin : Warm and dry.  Head: Normocephalic and atraumatic  Eyes: Sclera and conjunctiva clear; pupils round and reactive to light; extraocular movements intact  Ears: External normal; canals clear; tympanic membranes normal  Oropharynx: Pink, supple. No suspicious lesions  Neck: Supple without thyromegaly, adenopathy  Lungs: Respirations unlabored; clear to auscultation bilaterally without wheeze, rales, rhonchi  CVS exam: normal rate  and regular rhythm.  Abdomen: Soft; nontender; nondistended; normoactive bowel sounds; no masses or hepatosplenomegaly  Musculoskeletal: No deformities; no active joint inflammation  Extremities: No edema, cyanosis, clubbing  Vessels: Symmetric bilaterally  Neurologic: Alert and oriented; speech intact; face symmetrical; moves all extremities well; CNII-XII intact without focal deficit   Assessment:  1. Recurrent sinus infections     Plan:  Rx for Doxycycline 100 mg bid x 7 days, Prednisone 40 mg x 2 days, then 20 mg qd x 5 days; will refer to ENT for further evaluation.   No follow-ups on file.  Orders Placed This Encounter  Procedures   Ambulatory referral to ENT    Referral Priority:    Routine    Referral Type:   Consultation    Referral Reason:   Specialty Services Required    Requested Specialty:   Otolaryngology    Number of Visits Requested:   1    Requested Prescriptions   Signed Prescriptions Disp Refills   doxycycline (VIBRA-TABS) 100 MG tablet 14 tablet 0    Sig: Take 1 tablet (100 mg total) by mouth 2 (two) times daily.   predniSONE (DELTASONE) 20 MG tablet 9 tablet 0    Sig: Take 2 tablets daily x 2 days, then 1 tablet x 5 days

## 2022-09-20 ENCOUNTER — Encounter (INDEPENDENT_AMBULATORY_CARE_PROVIDER_SITE_OTHER): Payer: 59 | Admitting: Family Medicine

## 2022-09-20 DIAGNOSIS — E669 Obesity, unspecified: Secondary | ICD-10-CM

## 2022-09-21 DIAGNOSIS — E669 Obesity, unspecified: Secondary | ICD-10-CM | POA: Diagnosis not present

## 2022-09-21 MED ORDER — PHENTERMINE HCL 15 MG PO CAPS: 15.0000 mg | ORAL_CAPSULE | ORAL | 2 refills | Status: DC

## 2022-09-21 NOTE — Addendum Note (Signed)
Addended by: Lamar Blinks C on: 09/21/2022 12:06 PM   Modules accepted: Orders

## 2022-09-21 NOTE — Telephone Encounter (Signed)
Please see the MyChart message reply(ies) for my assessment and plan.  The patient gave consent for this Medical Advice Message and is aware that it may result in a bill to their insurance company as well as the possibility that this may result in a co-payment or deductible. They are an established patient, but are not seeking medical advice exclusively about a problem treated during an in person or video visit in the last 7 days. I did not recommend an in person or video visit within 7 days of my reply.  I spent a total of 12 minutes cumulative time within 7 days through MyChart messaging Latonya Knight, MD  

## 2022-10-05 LAB — HM PAP SMEAR: HM Pap smear: NORMAL

## 2022-10-05 LAB — RESULTS CONSOLE HPV: CHL HPV: NEGATIVE

## 2022-10-07 ENCOUNTER — Encounter: Payer: Self-pay | Admitting: Family Medicine

## 2022-10-07 NOTE — Telephone Encounter (Signed)
Are you okay with me drafting this note? Would you like an OV/VV?

## 2022-10-29 ENCOUNTER — Other Ambulatory Visit: Payer: Self-pay | Admitting: Family Medicine

## 2022-10-30 ENCOUNTER — Other Ambulatory Visit: Payer: Self-pay | Admitting: Family Medicine

## 2022-10-30 IMAGING — CR DG HIP (WITH OR WITHOUT PELVIS) 2V BILAT
3 series · 3 of 3 positions shown · non-contrast
Comparison: None.

CLINICAL DATA: No known injury, pain

EXAM:
DG HIP (WITH OR WITHOUT PELVIS) 2V BILAT

[w pelvis upright]
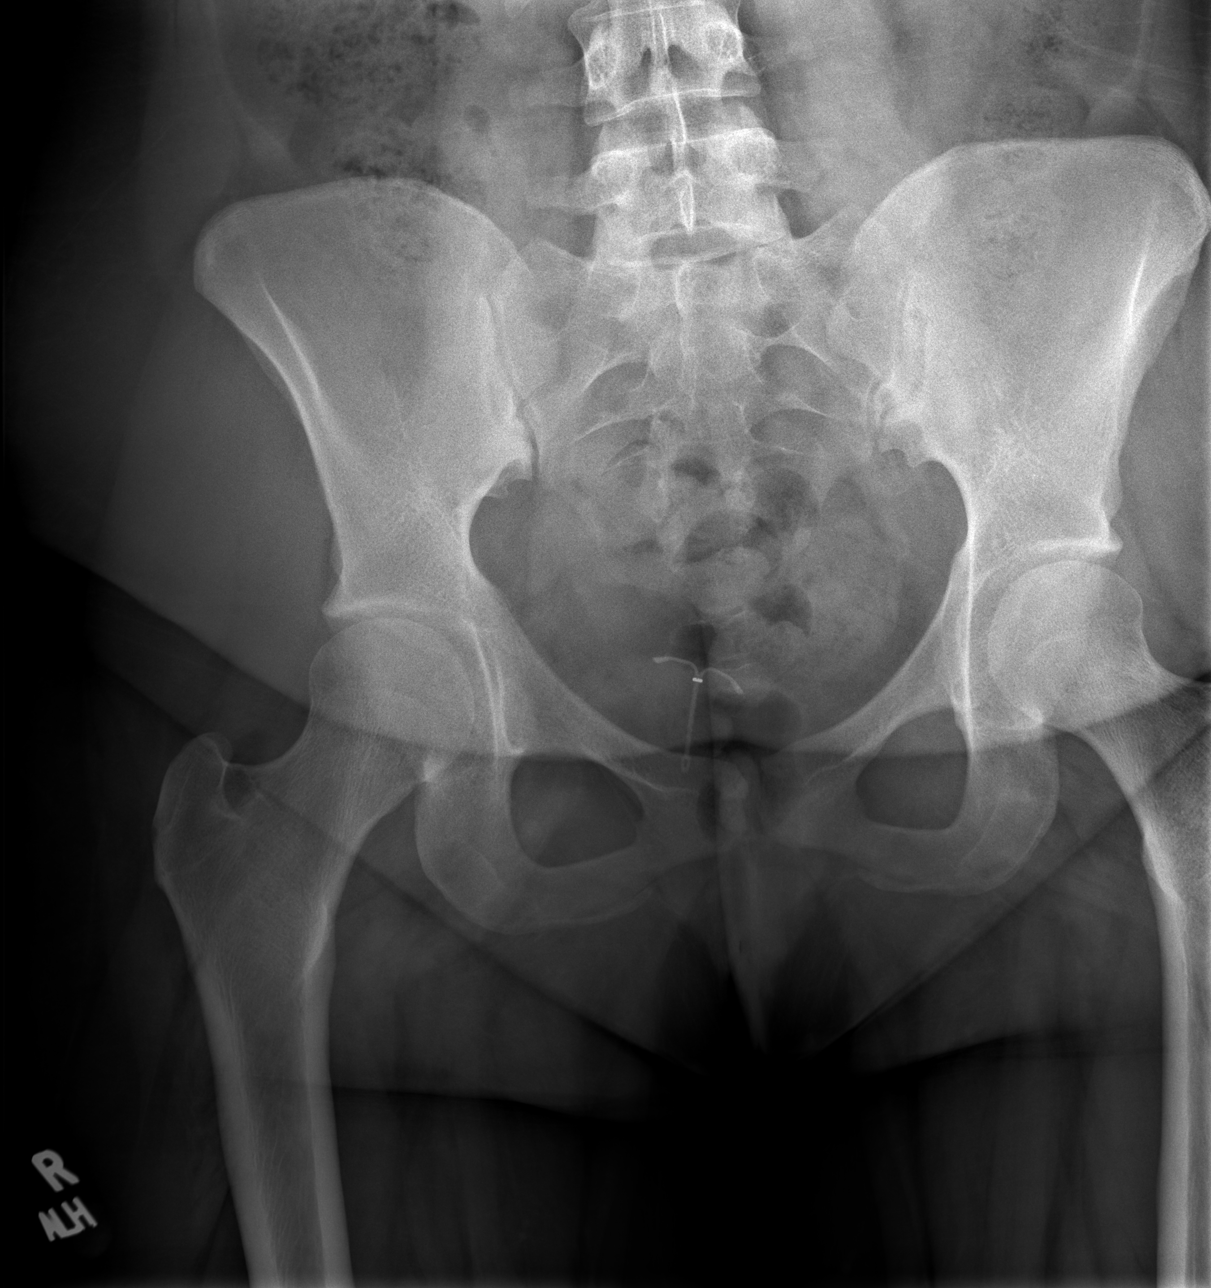

[w hip frog right]
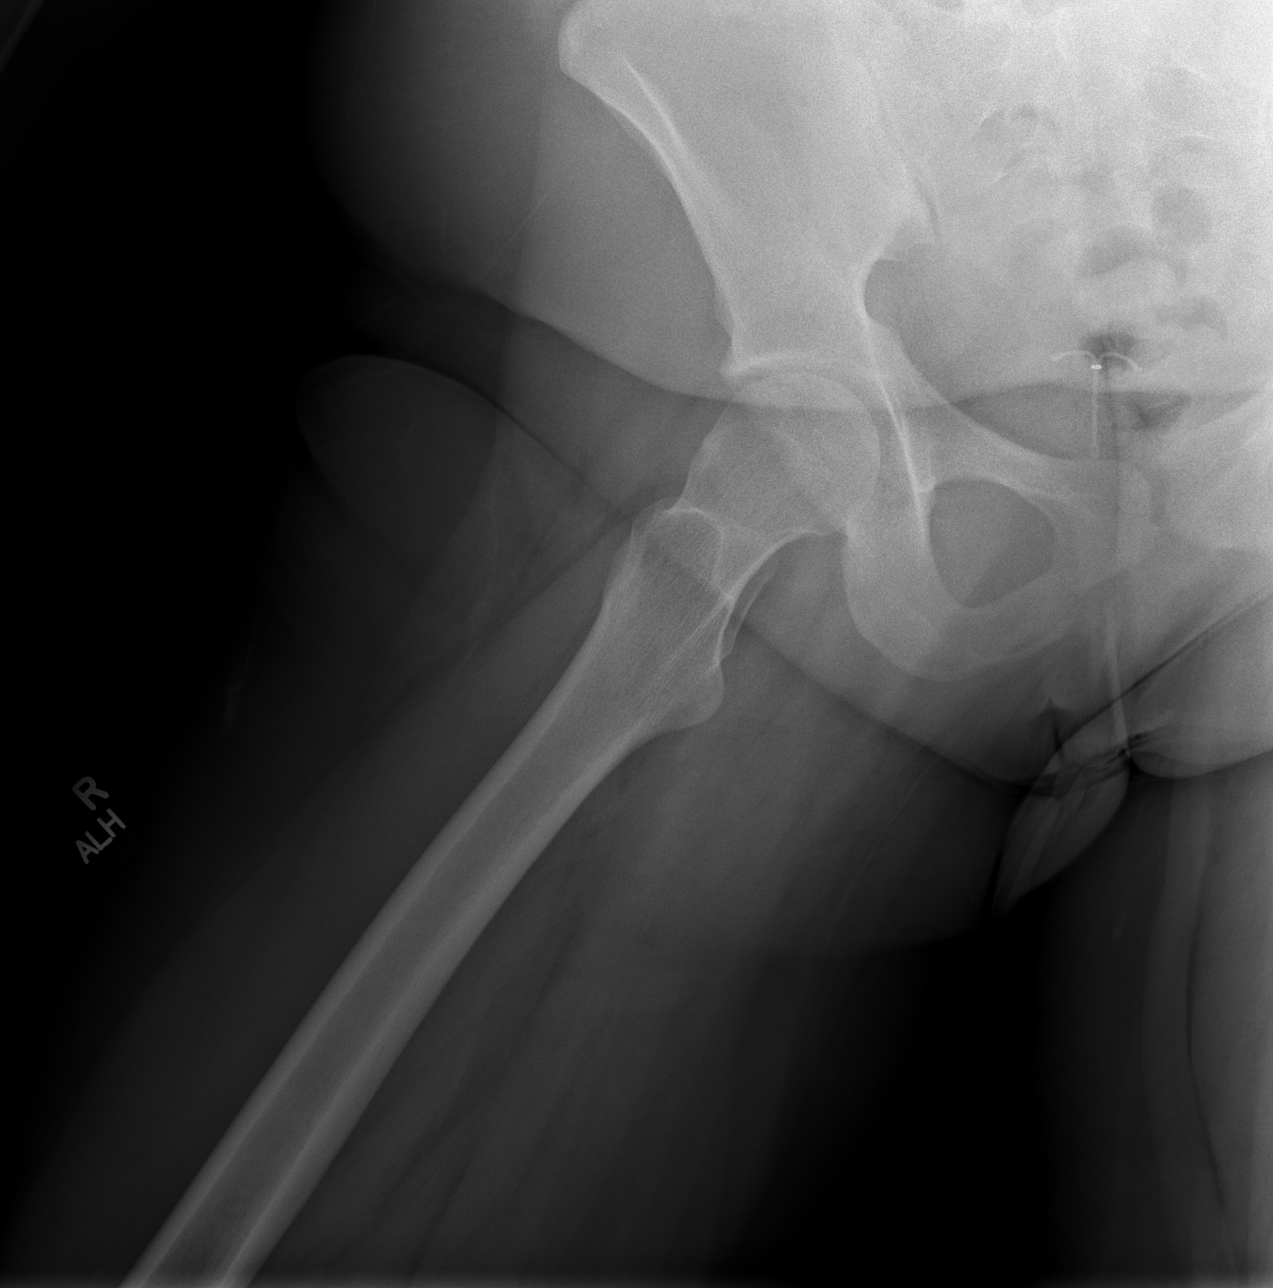

[w hip frog left]
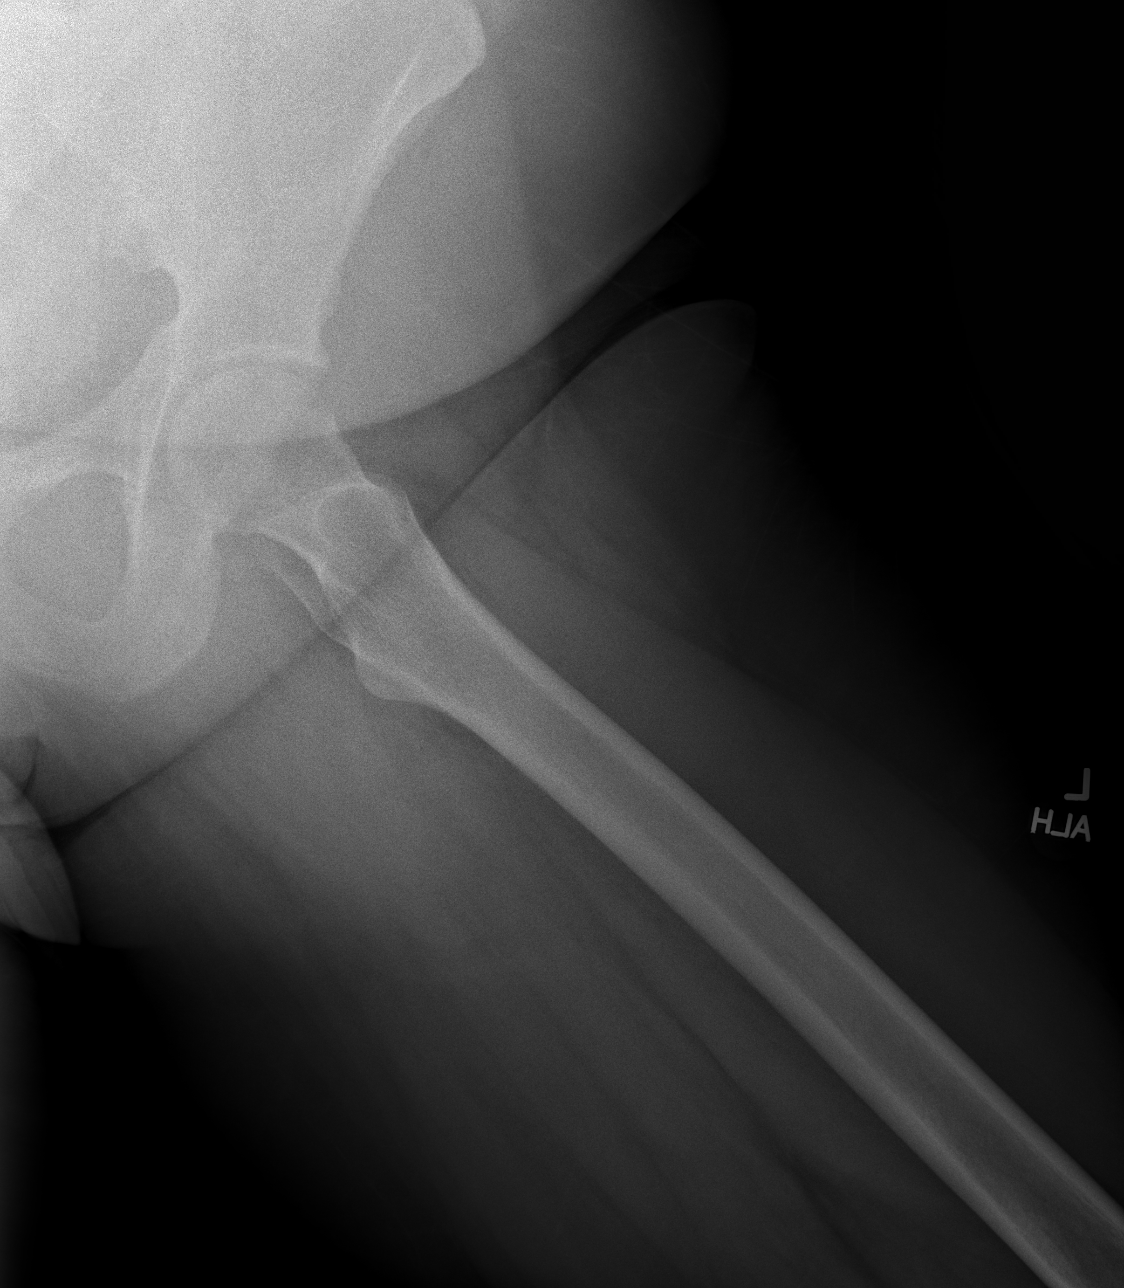

[3 of 3 positions shown; findings below may reference images not displayed]

FINDINGS: There is no evidence of hip fracture or dislocation. There is no
evidence of arthropathy or other focal bone abnormality. T-type IUD
in the lower pelvis.
IMPRESSION: No acute osseous injury of the bilateral hips.

## 2022-10-30 IMAGING — CR DG CERVICAL SPINE COMPLETE 4+V
5 series · 5 of 5 positions shown · non-contrast
Comparison: None.

CLINICAL DATA: No known injury, pain

EXAM:
CERVICAL SPINE - COMPLETE 4+ VIEW

[w cervical spine lat]
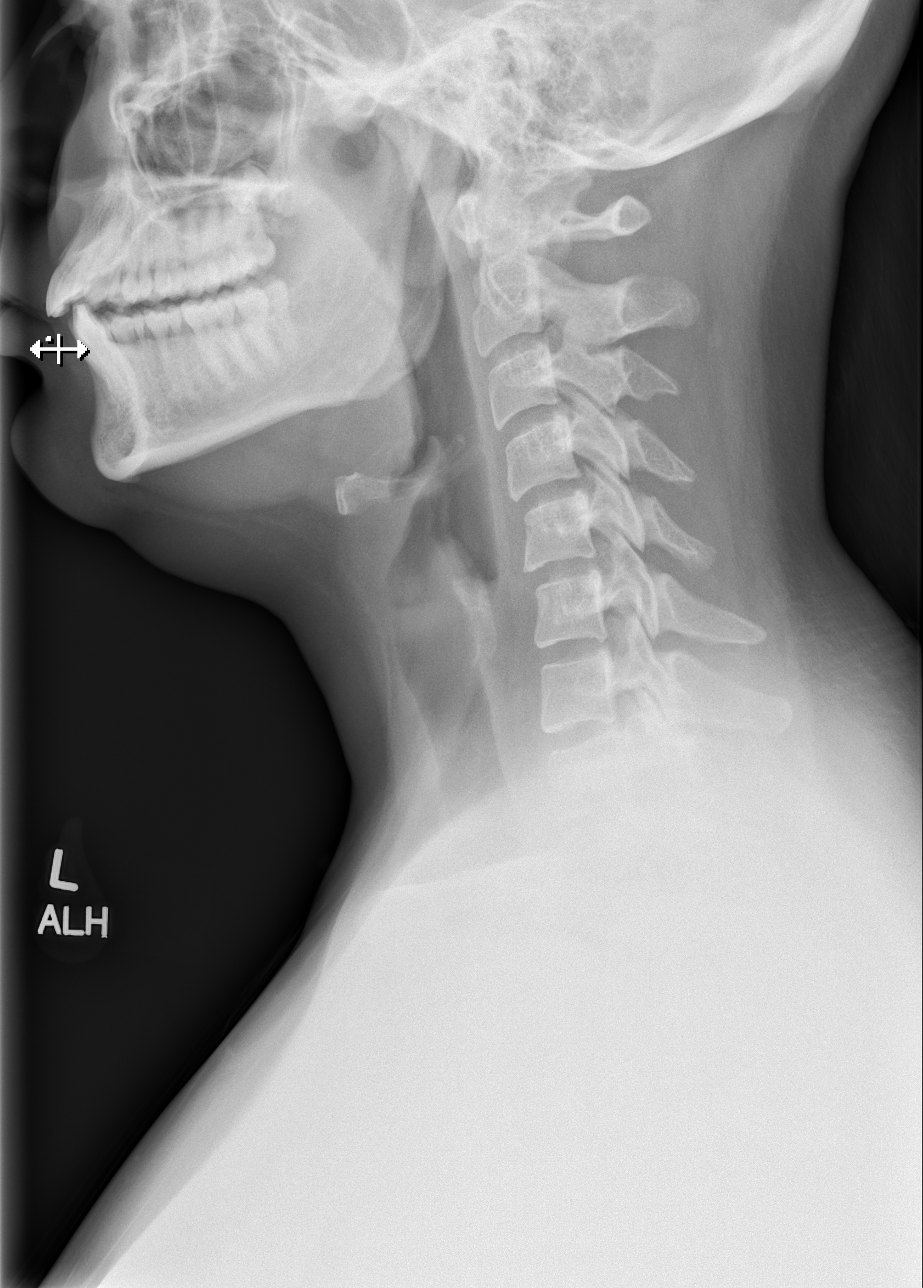

[w cervical spine ap_obl (1 of 2)]
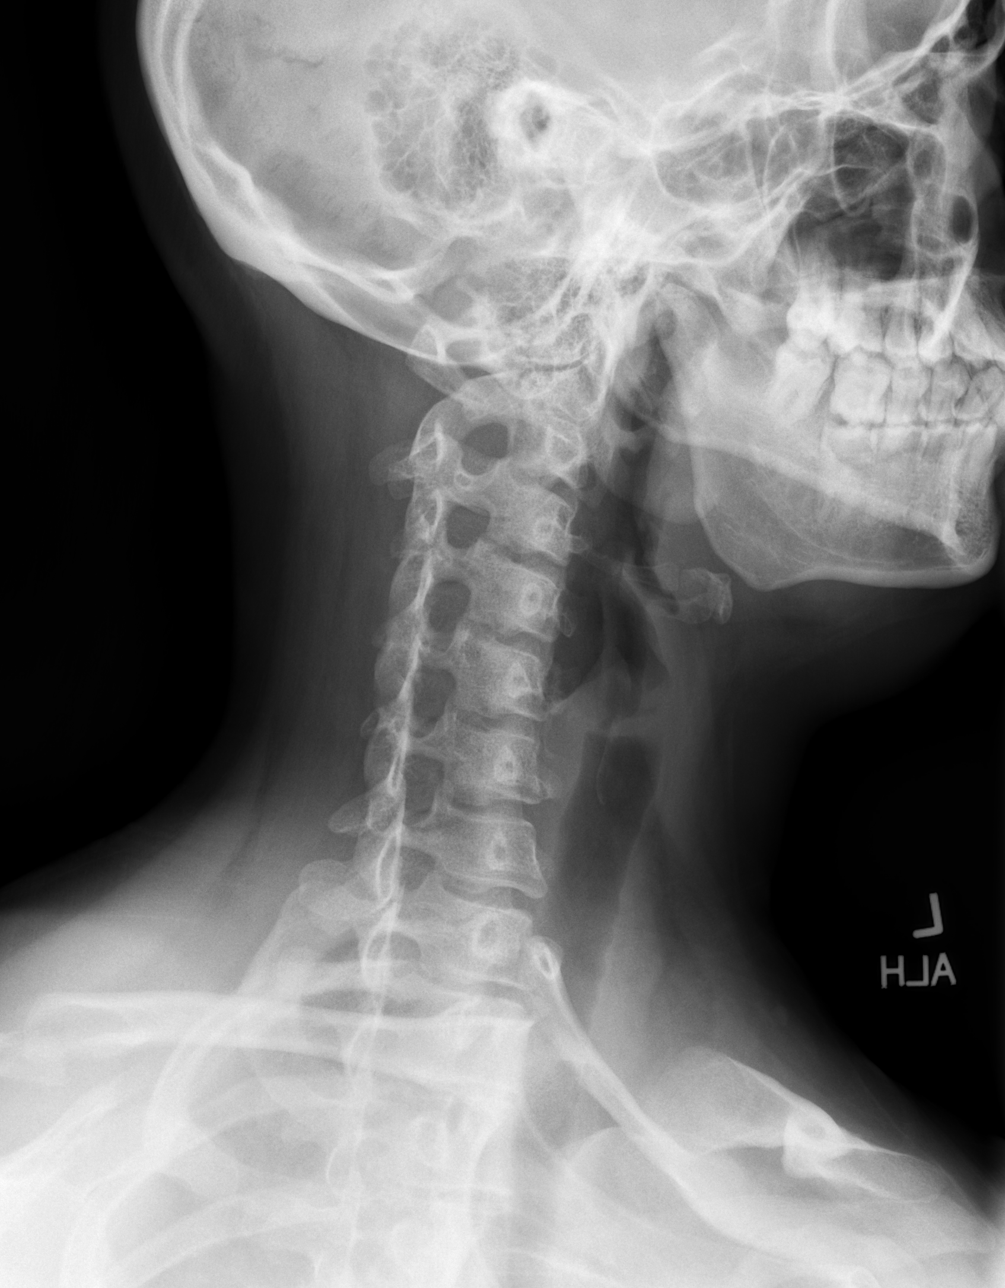

[w cervical spine ap_obl (2 of 2)]
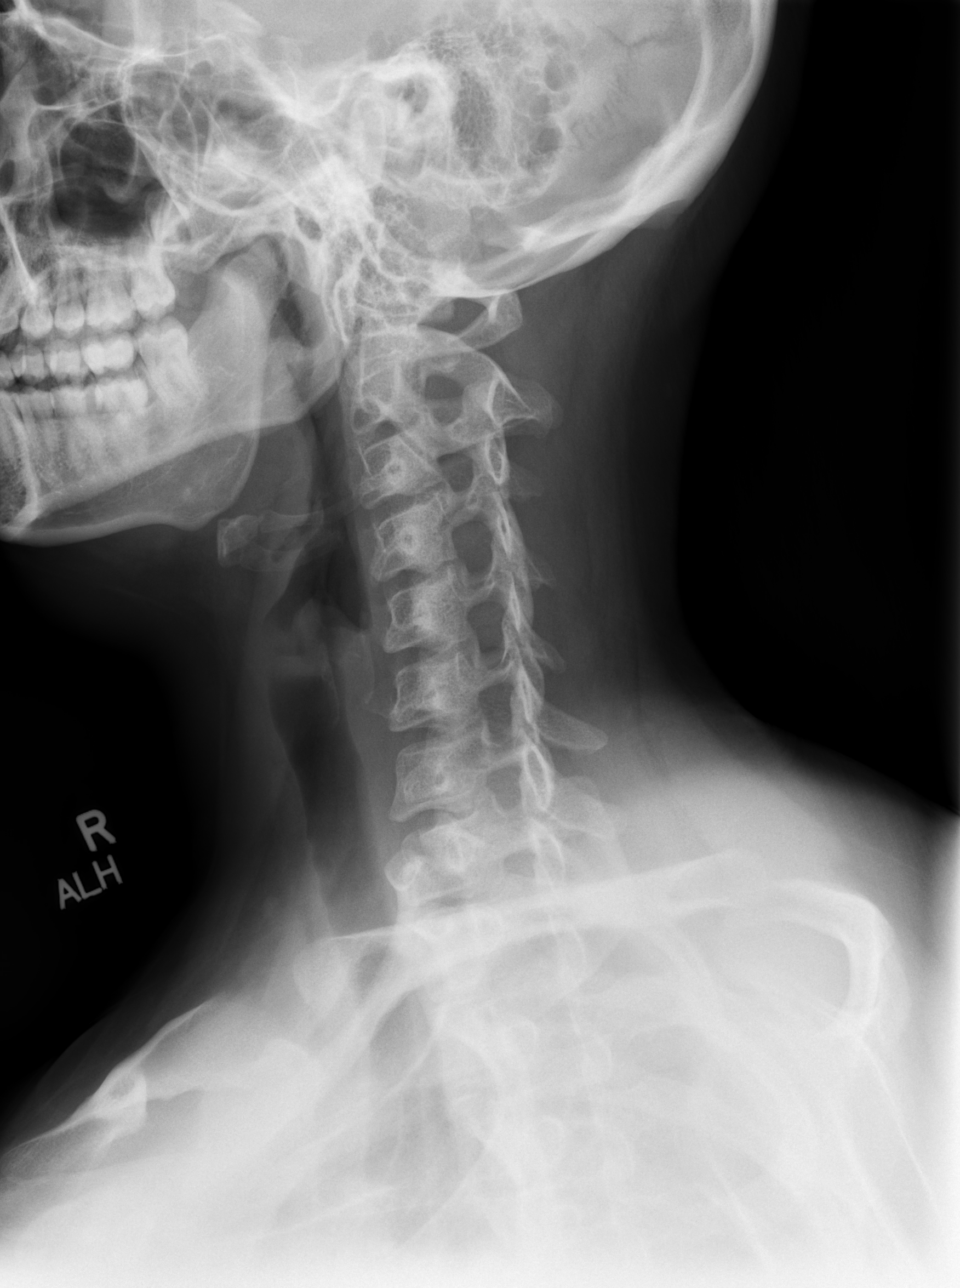

[w cervical spine ap]
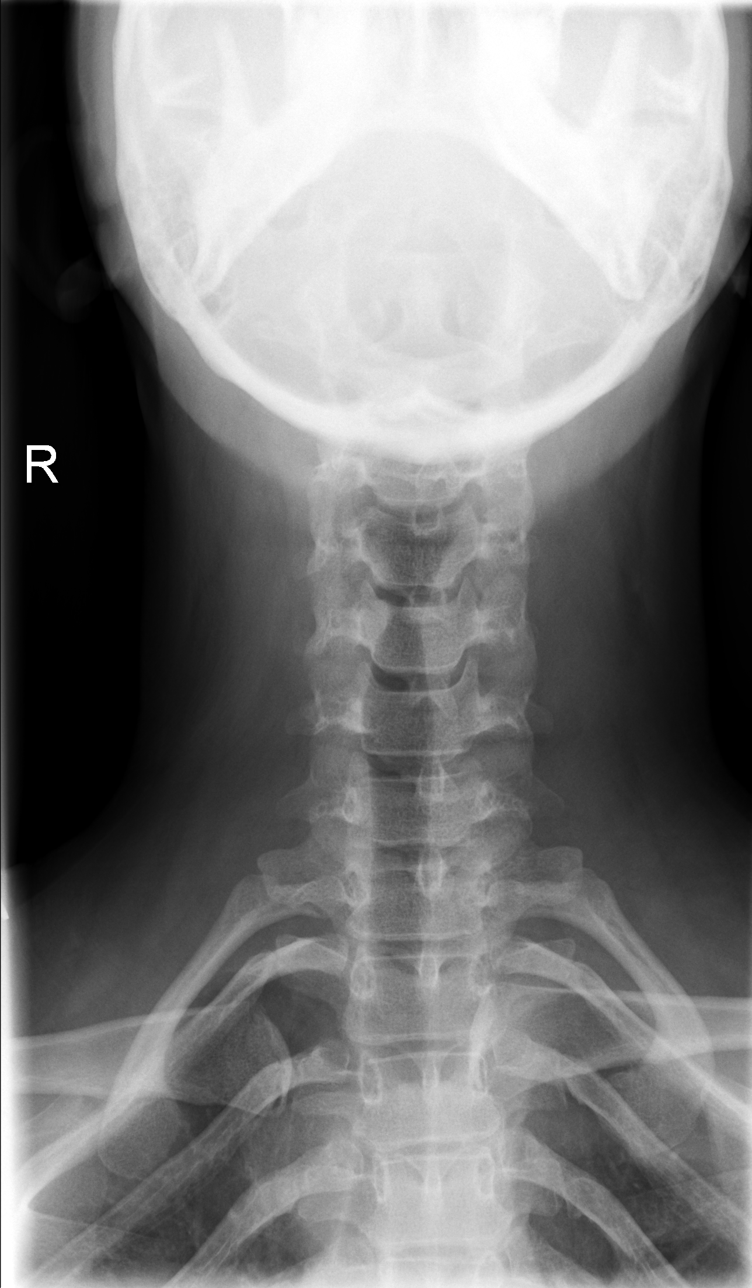

[w cervical spine odontoid]
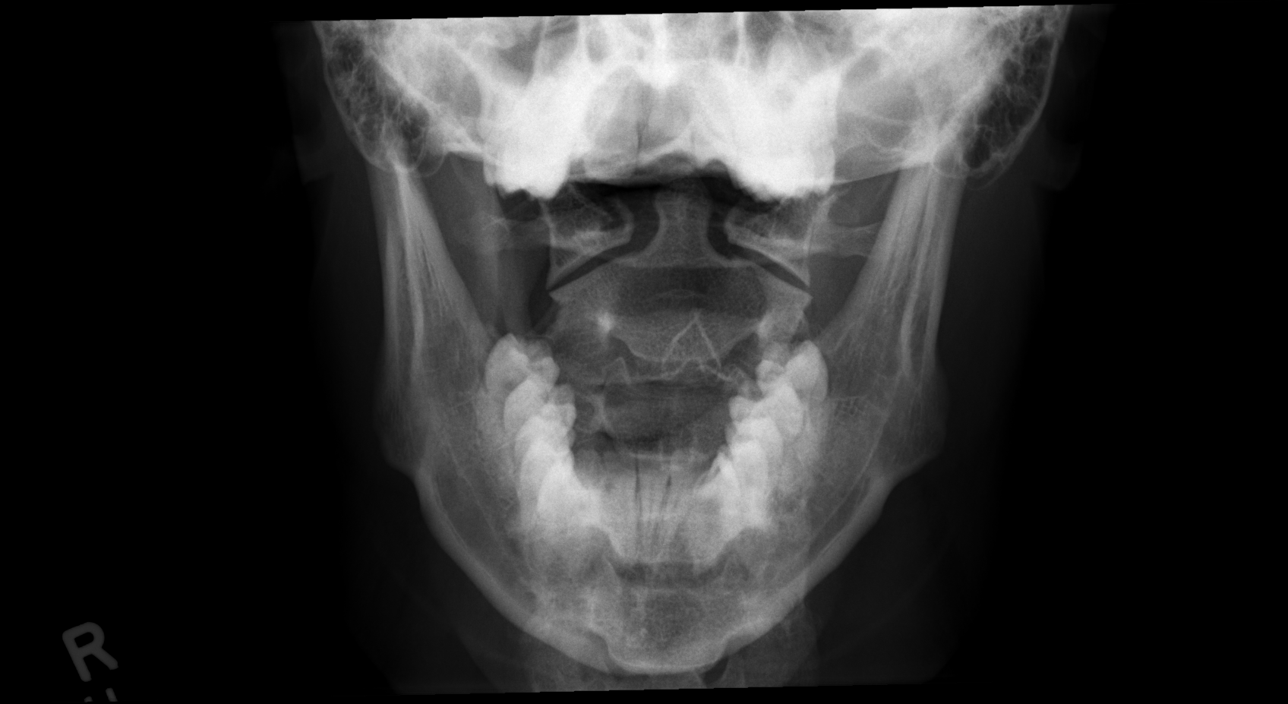

[5 of 5 positions shown; findings below may reference images not displayed]

FINDINGS: Cervical spine is visualized to the level of T1.

Vertebral body heights are maintained. Alignment is anatomic. Loss
of normal cervical lordosis with mild kyphosis centered at C4-5.
prevertebral soft tissues are normal. No acute fracture.

Disc spaces are maintained. Neural foramina are patent bilaterally.
IMPRESSION: Negative cervical spine radiographs.

## 2022-11-28 ENCOUNTER — Other Ambulatory Visit: Payer: Self-pay | Admitting: Family Medicine

## 2022-11-28 DIAGNOSIS — F411 Generalized anxiety disorder: Secondary | ICD-10-CM

## 2022-12-11 ENCOUNTER — Encounter (INDEPENDENT_AMBULATORY_CARE_PROVIDER_SITE_OTHER): Payer: 59 | Admitting: Family Medicine

## 2022-12-12 DIAGNOSIS — Z7184 Encounter for health counseling related to travel: Secondary | ICD-10-CM

## 2022-12-12 NOTE — Telephone Encounter (Signed)
Please see the MyChart message reply(ies) for my assessment and plan.  The patient gave consent for this Medical Advice Message and is aware that it may result in a bill to their insurance company as well as the possibility that this may result in a co-payment or deductible. They are an established patient, but are not seeking medical advice exclusively about a problem treated during an in person or video visit in the last 7 days. I did not recommend an in person or video visit within 7 days of my reply.  I spent a total of 10 minutes cumulative time within 7 days through MyChart messaging Scarlette Hogston, MD  

## 2022-12-14 ENCOUNTER — Other Ambulatory Visit: Payer: Self-pay | Admitting: Family Medicine

## 2022-12-14 MED ORDER — ALBUTEROL SULFATE HFA 108 (90 BASE) MCG/ACT IN AERS: 2.0000 | INHALATION_SPRAY | RESPIRATORY_TRACT | 5 refills | Status: AC | PRN

## 2022-12-23 ENCOUNTER — Other Ambulatory Visit: Payer: Self-pay | Admitting: Family Medicine

## 2022-12-23 DIAGNOSIS — F411 Generalized anxiety disorder: Secondary | ICD-10-CM

## 2023-01-19 ENCOUNTER — Encounter: Payer: Self-pay | Admitting: Family Medicine

## 2023-01-24 ENCOUNTER — Other Ambulatory Visit: Payer: Self-pay | Admitting: Family Medicine

## 2023-01-24 DIAGNOSIS — F411 Generalized anxiety disorder: Secondary | ICD-10-CM

## 2023-04-07 ENCOUNTER — Encounter: Payer: Self-pay | Admitting: Family Medicine

## 2023-04-15 NOTE — Progress Notes (Unsigned)
[facility] [address] [place], [place] [address] [phone]  Date:  04/19/2023   Name:  Kayla Hayes   DOB:  31-Jul-2000   MRN:  [id number]  PCP:  Pearline Cables, MD    Chief Complaint: No chief complaint on file.   History of Present Illness:  Kayla Hayes is a 22 y.o. very pleasant female patient who presents with the following:  Pt seen today for a CPE Last seen by myself about a year ago also for her CPE History of pulmonary embolism in the setting of COVID-19 infection and lupus anticoagulant positive/elevated cardiolipin IgM-she was treated with Xarelto for 2 years, this was discontinued in January 2023. Also history of hidradenitis suppurativa   Flu shot Covid booster STI screening Labs one year ago  Patient Active Problem List   Diagnosis Date Noted   Bilateral pulmonary embolism ([facility]) 05/24/2019   COVID-19 virus infection 05/24/2019   Thrombocytopenia ([facility]) 05/24/2019   Anxiety 11/29/2018   Asthma 08/08/2012    Past Medical History:  Diagnosis Date   Allergy    Anxiety    Asthma    Clotting disorder ([facility]) 05/2019   Pulmonary emboli ([facility]) 05/2019   both lungs    Past Surgical History:  Procedure Laterality Date   MOUTH SURGERY     wisdom teeth    Social History   Tobacco Use   Smoking status: Never   Smokeless tobacco: Never  Vaping Use   Vaping status: Never Used  Substance Use Topics   Alcohol use: No   Drug use: No    Family History  Problem Relation Age of Onset   Diabetes Maternal Grandmother    Diabetes Maternal Grandfather    Heart disease Paternal Grandfather    Colon cancer Neg Hx    Esophageal cancer Neg Hx    Rectal cancer Neg Hx    Stomach cancer Neg Hx     No Known Allergies  Medication list has been reviewed and updated.  Current Outpatient Medications on File Prior to Visit  Medication Sig Dispense Refill   albuterol (VENTOLIN HFA) 108 (90  Base) MCG/ACT inhaler Inhale 2 puffs into the lungs every 4 (four) hours as needed. 18 g 5   cetirizine (ZYRTEC) 10 MG tablet TAKE 1 TABLET (10 MG TOTAL) BY MOUTH AT BEDTIME. USE AS NEEDED FOR ALLERGIES 30 tablet 11   doxycycline (VIBRA-TABS) 100 MG tablet Take 1 tablet (100 mg total) by mouth 2 (two) times daily. 14 tablet 0   levonorgestrel (KYLEENA) 19.5 MG IUD by Intrauterine route once.     methocarbamol (ROBAXIN) 500 MG tablet Take 1 tablet (500 mg total) by mouth every 8 (eight) hours as needed for muscle spasms. 30 tablet 0   montelukast (SINGULAIR) 10 MG tablet TAKE 1 TABLET BY MOUTH EVERYDAY AT BEDTIME 90 tablet 3   phentermine 15 MG capsule Take 1 capsule (15 mg total) by mouth every morning. 30 capsule 2   predniSONE (DELTASONE) 20 MG tablet Take 2 tablets daily x 2 days, then 1 tablet x 5 days 9 tablet 0   SUMAtriptan (IMITREX) 100 MG tablet Take 1 tablet (100 mg total) by mouth every 2 (two) hours as needed for migraine. May repeat in 2 hours if headache persists or recurs.  Max 200 mg in 24 hours 20 tablet 2   venlafaxine XR (EFFEXOR-XR) 150 MG 24 hr capsule TAKE 1 CAPSULE BY MOUTH  DAILY WITH BREAKFAST. 90 capsule 1   No current facility-administered medications on file prior to visit.    Review of Systems:  As per HPI- otherwise negative.   Physical Examination: There were no vitals filed for this visit. There were no vitals filed for this visit. There is no height or weight on file to calculate BMI. Ideal Body Weight:    GEN: no acute distress. HEENT: Atraumatic, Normocephalic.  Ears and Nose: No external deformity. CV: RRR, No M/G/R. No JVD. No thrill. No extra heart sounds. PULM: CTA B, no wheezes, crackles, rhonchi. No retractions. No resp. distress. No accessory muscle use. ABD: S, NT, ND, +BS. No rebound. No HSM. EXTR: No c/c/e PSYCH: Normally interactive. Conversant.    Assessment and Plan: *** Physical exam- encouraged healthy diet and exercise routine   Signed Abbe Amsterdam, MD

## 2023-04-19 ENCOUNTER — Encounter: Payer: Self-pay | Admitting: Family Medicine

## 2023-04-19 ENCOUNTER — Ambulatory Visit (INDEPENDENT_AMBULATORY_CARE_PROVIDER_SITE_OTHER): Payer: 59 | Admitting: Family Medicine

## 2023-04-19 VITALS — BP 132/82 | HR 96 | Ht 67.0 in | Wt 273.0 lb

## 2023-04-19 DIAGNOSIS — Z1329 Encounter for screening for other suspected endocrine disorder: Secondary | ICD-10-CM

## 2023-04-19 DIAGNOSIS — Z23 Encounter for immunization: Secondary | ICD-10-CM

## 2023-04-19 DIAGNOSIS — F411 Generalized anxiety disorder: Secondary | ICD-10-CM

## 2023-04-19 DIAGNOSIS — G43109 Migraine with aura, not intractable, without status migrainosus: Secondary | ICD-10-CM

## 2023-04-19 DIAGNOSIS — Z130 Encounter for screening for diseases of the blood and blood-forming organs and certain disorders involving the immune mechanism: Secondary | ICD-10-CM

## 2023-04-19 DIAGNOSIS — Z13220 Encounter for screening for lipoid disorders: Secondary | ICD-10-CM

## 2023-04-19 DIAGNOSIS — R5383 Other fatigue: Secondary | ICD-10-CM | POA: Diagnosis not present

## 2023-04-19 DIAGNOSIS — Z131 Encounter for screening for diabetes mellitus: Secondary | ICD-10-CM | POA: Diagnosis not present

## 2023-04-19 DIAGNOSIS — E66813 Obesity, class 3: Secondary | ICD-10-CM

## 2023-04-19 DIAGNOSIS — Z0000 Encounter for general adult medical examination without abnormal findings: Secondary | ICD-10-CM

## 2023-04-19 DIAGNOSIS — Z6841 Body Mass Index (BMI) 40.0 and over, adult: Secondary | ICD-10-CM

## 2023-04-19 LAB — LIPID PANEL
Cholesterol: 187 mg/dL (ref 0–200)
HDL: 47.3 mg/dL (ref >39.00)
LDL Cholesterol: 71 mg/dL (ref 0–99)
NonHDL: 139.68
Total CHOL/HDL Ratio: 4
Triglycerides: 343 mg/dL — ABNORMAL HIGH (ref 0.0–149.0)
VLDL: 68.6 mg/dL — ABNORMAL HIGH (ref 0.0–40.0)

## 2023-04-19 LAB — COMPREHENSIVE METABOLIC PANEL
ALT: 16 U/L (ref 0–35)
AST: 12 U/L (ref 0–37)
Albumin: 4.3 g/dL (ref 3.5–5.2)
Alkaline Phosphatase: 77 U/L (ref 39–117)
BUN: 13 mg/dL (ref 6–23)
CO2: 30 meq/L (ref 19–32)
Calcium: 9.8 mg/dL (ref 8.4–10.5)
Chloride: 103 meq/L (ref 96–112)
Creatinine, Ser: 0.85 mg/dL (ref 0.40–1.20)
GFR: 96.31 mL/min (ref >60.00)
Glucose, Bld: 102 mg/dL — ABNORMAL HIGH (ref 70–99)
Potassium: 3.9 meq/L (ref 3.5–5.1)
Sodium: 140 meq/L (ref 135–145)
Total Bilirubin: 0.3 mg/dL (ref 0.2–1.2)
Total Protein: 6.6 g/dL (ref 6.0–8.3)

## 2023-04-19 LAB — CBC
HCT: 41.5 % (ref 36.0–46.0)
Hemoglobin: 13.6 g/dL (ref 12.0–15.0)
MCHC: 32.9 g/dL (ref 30.0–36.0)
MCV: 85.9 fL (ref 78.0–100.0)
Platelets: 325 10*3/uL (ref 150.0–400.0)
RBC: 4.83 Mil/uL (ref 3.87–5.11)
RDW: 13 % (ref 11.5–15.5)
WBC: 10.6 10*3/uL — ABNORMAL HIGH (ref 4.0–10.5)

## 2023-04-19 LAB — VITAMIN D 25 HYDROXY (VIT D DEFICIENCY, FRACTURES): VITD: 51.88 ng/mL (ref 30.00–100.00)

## 2023-04-19 LAB — HEMOGLOBIN A1C: Hgb A1c MFr Bld: 5.5 % (ref 4.6–6.5)

## 2023-04-19 LAB — TSH: TSH: 2.84 u[IU]/mL (ref 0.35–5.50)

## 2023-04-19 MED ORDER — SUMATRIPTAN SUCCINATE 100 MG PO TABS: 100.0000 mg | ORAL_TABLET | ORAL | 4 refills | Status: DC | PRN

## 2023-04-19 MED ORDER — VENLAFAXINE HCL ER 150 MG PO CP24: 150.0000 mg | ORAL_CAPSULE | Freq: Every day | ORAL | 3 refills | Status: DC

## 2023-04-19 NOTE — Patient Instructions (Addendum)
It was good to see you today- I will be in touch with your labs Flu shot today  Recommend covid booster at your pharmacy  You might check out [facility] online for compounded GLP-1 drugs Also look into weight loss surgery - you can sign up for the free seminar to find out more and potentially get started  [email or web address]  Please let me know if you need to make any changes in your Effexor or in your migraine control medication

## 2023-04-24 ENCOUNTER — Encounter: Payer: Self-pay | Admitting: Family Medicine

## 2023-04-30 ENCOUNTER — Encounter: Payer: Self-pay | Admitting: Family Medicine

## 2023-05-01 NOTE — Telephone Encounter (Signed)
Form and immunizations from [facility] have been placed in folder for signature.

## 2023-05-07 ENCOUNTER — Encounter: Payer: Self-pay | Admitting: Family Medicine

## 2023-05-07 DIAGNOSIS — G43109 Migraine with aura, not intractable, without status migrainosus: Secondary | ICD-10-CM

## 2023-05-08 ENCOUNTER — Telehealth: Payer: Self-pay

## 2023-05-08 MED ORDER — NURTEC 75 MG PO TBDP: ORAL_TABLET | ORAL | 3 refills | Status: DC

## 2023-05-08 NOTE — Telephone Encounter (Signed)
PA initiated via Covermymeds; KEY: [id number]. Awaiting determination.

## 2023-05-08 NOTE — Addendum Note (Signed)
Addended by: Abbe Amsterdam C on: 05/08/2023 11:06 AM   Modules accepted: Orders

## 2023-05-09 ENCOUNTER — Encounter: Payer: Self-pay | Admitting: Family Medicine

## 2023-05-09 DIAGNOSIS — G43109 Migraine with aura, not intractable, without status migrainosus: Secondary | ICD-10-CM

## 2023-05-09 MED ORDER — TOPIRAMATE 50 MG PO TABS: ORAL_TABLET | ORAL | 1 refills | Status: DC

## 2023-05-09 NOTE — Telephone Encounter (Signed)
PA denied.   The request for coverage for NURTEC TAB 75MG  ODT, use as directed (15 per month), is denied. This decision is based on health plan criteria for NURTEC TAB 75MG  ODT. This medicine is covered only if: You have a history of failure (after a trial of at least two months), contraindication or intolerance to one of the following preventative therapies (document name and date tried): (I) One of the following beta-blockers: Atenolol, metoprolol, nadolol, propranolol, or timolol. (II) Candesartan (Atacand). (III) Divalproex sodium (Depakote/Depakote ER). (IV) Topiramate (Topamax). (V) A tricyclic antidepressant Claiborne Billings is, amitriptyline (Elavil), nortriptyline (Pamelor)]. The information provided does not show that you meet the criteria listed above. Reviewed by: Kearney Hard, R.Ph. *Please note: This medication cannot be further reviewed for the quantity limit until the above criteria have been addressed

## 2023-05-28 ENCOUNTER — Encounter: Payer: Self-pay | Admitting: Family Medicine

## 2023-06-12 ENCOUNTER — Other Ambulatory Visit (HOSPITAL_COMMUNITY): Payer: Self-pay | Admitting: General Surgery

## 2023-06-19 ENCOUNTER — Other Ambulatory Visit: Payer: Self-pay

## 2023-06-19 ENCOUNTER — Ambulatory Visit (HOSPITAL_COMMUNITY)
Admission: RE | Admit: 2023-06-19 | Discharge: 2023-06-19 | Disposition: A | Discharge status: Home / Self Care | Payer: 59 | Source: Ambulatory Visit | Attending: General Surgery

## 2023-06-19 ENCOUNTER — Other Ambulatory Visit (HOSPITAL_COMMUNITY): Payer: Self-pay | Admitting: General Surgery

## 2023-06-19 ENCOUNTER — Ambulatory Visit (HOSPITAL_COMMUNITY)
Admission: RE | Admit: 2023-06-19 | Discharge: 2023-06-19 | Disposition: A | Discharge status: Home / Self Care | Payer: 59 | Source: Ambulatory Visit | Attending: General Surgery | Admitting: General Surgery

## 2023-07-03 ENCOUNTER — Ambulatory Visit: Payer: 59 | Admitting: Dietician

## 2023-07-10 ENCOUNTER — Encounter: Payer: Self-pay | Admitting: Dietician

## 2023-07-10 ENCOUNTER — Encounter: Payer: 59 | Attending: General Surgery | Admitting: Dietician

## 2023-07-10 VITALS — Ht 67.0 in | Wt 275.2 lb

## 2023-07-10 DIAGNOSIS — E669 Obesity, unspecified: Secondary | ICD-10-CM | POA: Insufficient documentation

## 2023-07-10 NOTE — Progress Notes (Signed)
Nutrition Assessment for Bariatric Surgery: Pre-Surgery Behavioral and Nutrition Intervention Program   Medical Nutrition Therapy  Appt Start Time: 1530    End Time: 1625  Patient was seen on 07/10/2023 for Pre-Operative Nutrition Assessment. Purpose of todays visit  enhance perioperative outcomes along with a healthy weight maintenance   Referral stated Supervised Weight Loss (SWL) visits needed: 3 months  Planned surgery: Sleeve Gastrectomy Pt expectation of surgery: feel better, and under 200, be able to move more without being out of breath.   NUTRITION ASSESSMENT   Anthropometrics  Start weight at [facility]: 275.2 lbs (date: 07/10/2023)  Height: 67 in BMI: 43.10 kg/m2     Clinical   Pharmacotherapy: History of weight loss medication used: phentermine (stopped taking, stating she took it twice and never could get on schedule to take it.  Medical hx: obesity, asthma Medications: venlafaxine, cetirizine, montelukast, topamax, vit D, kyleena (IUD)   Labs: triglycerides 343.0, VLDL 68.6 Notable signs/symptoms: none noted Any previous deficiencies? No  Evaluation of Nutritional Deficiencies: Micronutrient Nutrition Focused Physical Exam: Hair: No issues observed Eyes: No issues observed Mouth: No issues observed Neck: No issues observed Nails: No issues observed Skin: No issues observed  Lifestyle & Dietary Hx  Pt states she wants to fit physical activity in to her routine.  Pt states she has had asthma since she was little. 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 Pt states the surgeon told her that she has stretched out her stomach and can eat a lot at one time.  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  Current Patient Perceived Stress Level as stated by pt on a scale of 1-10: 7-8       Stress Management Techniques: art, drawing coloring, uses her kriket machine, sleep  According to the Dietary Guidelines for Americans Recommendation: equivalent 1.5-2 cups fruits per day, equivalent 2-3 cups vegetables per day and at least half all grains whole  Fruit servings per day (on average): 1-2, meeting 66-100% recommendation  Non-starchy vegetable servings per day (on average): 2-3, meeting 100% recommendation  Whole Grains per day (on average): 1-2  Number of meals missed/skipped per week out of 21: 4-7  24-Hr Dietary Recall First Meal: skip or fast food (biscuit with chicken) Snack:  Second Meal: elementary school cafeteria or frozen meal or [employer] Snack:  Third Meal: eat out ([employer] sometimes) or [employer] Snack: something sweet, 1/2 pint of ice cream Beverages: soda (regular) specifically [facility] or water with no sugar flavorings  Alcoholic beverages per week: rarely (one a month or every other month)   Estimated Energy Needs Calories: 1500  NUTRITION DIAGNOSIS  Overweight/obesity ([place]-3.3) related to past poor dietary habits and physical inactivity as evidenced by patient w/ planned sleeve surgery following dietary guidelines for continued weight loss.    NUTRITION INTERVENTION  Nutrition counseling (C-1) and education (E-2) to facilitate bariatric surgery goals.  Educated pt on micronutrient deficiencies post-surgery and behavioral/dietary strategies to start in order to mitigate that risk   Behavioral and Dietary Interventions Pre-Op Goals Reviewed with the Patient Nutrition: Healthy Eating Behaviors Switch to non-caloric,  non-carbonated and non-caffeinated beverages such as  water, unsweetened tea, Crystal Light and zero calorie  beverages (aim for 64 oz. per day) Cut out grazing between meals or at night  Find a protein shake you like Eat every 3-5 hours        Eliminate distractions while eating (TV, computer, reading, driving, texting) Take 16-10 minutes to eat a meal  Decrease high sugar foods/decrease high fat/fried foods Eliminate alcoholic beverages Increase protein intake (eggs, fish, chicken, yogurt) before surgery Eat non starchy vegetables 2 times a day 7 days a week Eat complex carbohydrates such as whole grains and fruits   Behavioral Modification: Physical Activity Increase my usual daily activity (use stairs, park farther, etc.) Engage in __________walking_____________  activity  ___30____ minutes ___3___ times per week in the evening/afternoon Other:    _________________________________________________________________     Problem Solving I will think about my usual eating patterns and how to tweak them How can my friends and family support me Barriers to starting my changes Learn and understand appetite verses hunger   Healthy Coping Allow for ___________ activities per week to help me manage stress Reframe negative thoughts I will keep a picture of someone or something that is my inspiration & look at it daily   Monitoring  Weigh myself once a week  Measure my progress by monitoring how my clothes fit Keep a food record of what I eat and drink for the next ________ (time period) Take pictures of what I eat and drink for the next ________ (time period) Use an app to count steps/day for the next_______ (time period) Measure my progress such as increased energy and more restful sleep Monitor your acid reflux and bowel habits, are they getting better?   *Goals that are bolded indicate the pt would like to start working towards these  Handouts Provided Include  Bariatric Surgery handouts (Nutrition Visits, Pre Surgery Behavioral Change Goals, Protein Shakes Brands to Choose From, Vitamins &  Mineral Supplementation)  Learning Style & Readiness for Change Teaching method utilized: Visual, Auditory, and hands on  Demonstrated degree of understanding via: Teach Back  Readiness Level: preparation Barriers to learning/adherence to lifestyle change: nothing identified  RD's Notes for Next Visit Pt progress toward chosen goals.    MONITORING & EVALUATION Dietary intake, weekly physical activity, body weight, and preoperative behavioral change goals   Next Steps  Patient is to follow up at [facility] in two weeks for first SWL visit.

## 2023-07-24 ENCOUNTER — Encounter: Payer: 59 | Attending: General Surgery | Admitting: Dietician

## 2023-07-24 ENCOUNTER — Encounter: Payer: Self-pay | Admitting: Dietician

## 2023-07-24 VITALS — Ht 67.0 in | Wt 272.2 lb

## 2023-07-24 DIAGNOSIS — E669 Obesity, unspecified: Secondary | ICD-10-CM | POA: Diagnosis present

## 2023-07-24 NOTE — Progress Notes (Signed)
Supervised Weight Loss Visit Bariatric Nutrition Education Appt Start Time: 1557    End Time: 1618  Planned surgery: Sleeve Gastrectomy Pt expectation of surgery: feel better, and under 200, be able to move more without being out of breath.  Referral stated Supervised Weight Loss (SWL) visits needed: 3 months  1 out of 3 SWL Appointments    NUTRITION ASSESSMENT   Anthropometrics  Start weight at [facility]: 275.2 lbs (date: 07/10/2023)  Height: 67 in Weight today: 272.2 lb BMI: 42.63 kg/m2     Clinical   Pharmacotherapy: History of weight loss medication used: phentermine (stopped taking, stating she took it twice and never could get on schedule to take it.  Medical hx: obesity, asthma Medications: venlafaxine, cetirizine, montelukast, topamax, vit D, kyleena (IUD)   Labs: triglycerides 343.0, VLDL 68.6 Notable signs/symptoms: none noted Any previous deficiencies? No  Lifestyle & Dietary Hx  Pt states she was sick for a week, stating she started physical activity for 2 days. Pt states she is trying to drink more fluids, stating it is getting better. Pt states she wants to start eating breakfast, stating she tried greek yogurt ([employer] Triple zero) and liked it.  Estimated daily fluid intake: 48 oz Supplements: vit D; vit C Current average weekly physical activity: walked two days; ADLs (pt states she was sick for a week)  24-Hr Dietary Recall First Meal: skip or fast food (biscuit with chicken) Snack:  Second Meal: elementary school cafeteria or frozen meal or [employer] Snack:  Third Meal: eat out ([employer] sometimes) or [employer] Snack: something sweet, 1/2 pint of ice cream Beverages: soda (regular) specifically [facility] or water with no sugar flavorings  Alcoholic beverages per week: rarely (one a month or every other month)   Estimated Energy Needs Calories: 1500  NUTRITION DIAGNOSIS  Overweight/obesity ([place]-3.3) related to past poor dietary habits and physical  inactivity as evidenced by patient w/ planned sleeve surgery following dietary guidelines for continued weight loss.  NUTRITION INTERVENTION  Nutrition counseling (C-1) and education (E-2) to facilitate bariatric surgery goals.  Pre-Op Goals Reviewed with the Patient Nutrition: Healthy Eating Behaviors Switch to non-caloric, non-carbonated and non-caffeinated beverages such as  water, unsweetened tea, Crystal Light and zero calorie beverages (aim for 64 oz. per day) Cut out grazing between meals or at night  Find a protein shake you like Eat every 3-5 hours        Eliminate distractions while eating (TV, computer, reading, driving, texting) Take 24-40 minutes to eat a meal  Decrease high sugar foods/decrease high fat/fried foods Eliminate alcoholic beverages Increase protein intake (eggs, fish, chicken, yogurt) before surgery Eat non starchy vegetables 2 times a day 7 days a week Eat complex carbohydrates such as whole grains and fruits   Behavioral Modification: Physical Activity Increase my usual daily activity (use stairs, park farther, etc.) Engage in __________walking_____________  activity  ___30____ minutes ___3___ times per week in the evening/afternoon Other:    _________________________________________________________________      Pre-Op Goals Progress & New Goals Re-engage: Increase physical activity; walking 3 days per week for 30 minutes Re-engage: Aim for 64 ounces of hydrating fluids; no soda New: eat breakfast (yogurt, overnight oats, etc...); avoid skipping meals  Handouts Provided Include  Meal Ideas Bariatric MyPlate  Learning Style & Readiness for Change Teaching method utilized: Visual & Auditory  Demonstrated degree of understanding via: Teach Back  Readiness Level: preparation Barriers to learning/adherence to lifestyle change: nothing identified  RD's Notes for next Visit  Patient progress toward chosen goals  MONITORING & EVALUATION Dietary  intake, weekly physical activity, body weight, and pre-op goals in 1 month.   Next Steps  Patient is to return to [facility] in 1 month for for next SWL visit.

## 2023-08-03 ENCOUNTER — Ambulatory Visit (HOSPITAL_COMMUNITY): Payer: 59 | Admitting: Licensed Clinical Social Worker

## 2023-08-03 DIAGNOSIS — F411 Generalized anxiety disorder: Secondary | ICD-10-CM
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
## 2023-08-21 ENCOUNTER — Encounter: Payer: 59 | Attending: General Surgery | Admitting: Dietician

## 2023-08-21 ENCOUNTER — Encounter: Payer: Self-pay | Admitting: Dietician

## 2023-08-21 VITALS — Ht 67.0 in | Wt 273.2 lb

## 2023-08-21 DIAGNOSIS — E669 Obesity, unspecified: Secondary | ICD-10-CM | POA: Insufficient documentation

## 2023-08-21 NOTE — Progress Notes (Signed)
 Supervised Weight Loss Visit Bariatric Nutrition Education Appt Start Time: 1602    End Time: 1627  Planned surgery: Sleeve Gastrectomy Pt expectation of surgery: feel better, and under 200, be able to move more without being out of breath.  Referral stated Supervised Weight Loss (SWL) visits needed: 3 months  2 out of 3 SWL Appointments    NUTRITION ASSESSMENT   Anthropometrics  Start weight at [facility]: 275.2 lbs (date: 07/10/2023)  Height: 67 in Weight today: 273.2 lb BMI: 42.79 kg/m2     Clinical   Pharmacotherapy: History of weight loss medication used: phentermine (stopped taking, stating she took it twice and never could get on schedule to take it.  Medical hx: obesity, asthma Medications: venlafaxine, cetirizine, montelukast, topamax, vit D, kyleena (IUD)   Labs: triglycerides 343.0, VLDL 68.6 Notable signs/symptoms: none noted Any previous deficiencies? No  Lifestyle & Dietary Hx  Pt states on Monday and Wednesday works for physical activity. Pt states she has had a zero sugar yogurt drink, stating she is keeping it at school to avoid. Pt states she is using zero calorie water flavoring, stating she is using it as a sub for [facility] and increase fluid intake. Pt states her goal is to get to one mountain dew a week and then cut it completely. Pt states she is drinking one every other day right now, stating that is an improvement.   Estimated daily fluid intake: 48 oz Supplements: vit D; vit C Current average weekly physical activity: walking two days per week for 30 or more minutes; ADLs   24-Hr Dietary Recall First Meal: skip or fast food (biscuit with chicken) Snack:  Second Meal: chicken sandwich with broccoli and peaches Snack:  Third Meal: eat out ([employer] sometimes) or [employer] Snack: something sweet, 1/2 pint of ice cream Beverages: soda (regular) specifically [facility] or water with no sugar flavorings  Alcoholic beverages per week: rarely (one a  month or every other month)   Estimated Energy Needs Calories: 1500  NUTRITION DIAGNOSIS  Overweight/obesity ([place]-3.3) related to past poor dietary habits and physical inactivity as evidenced by patient w/ planned sleeve surgery following dietary guidelines for continued weight loss.  NUTRITION INTERVENTION  Nutrition counseling (C-1) and education (E-2) to facilitate bariatric surgery goals.  Pre-Op Goals Reviewed with the Patient  The Importance of Protein Protein is needed after weight loss surgery to: Prevent fatigue Support wound healing Preserve muscle mass and prevent your metabolism from slowing down Encourage the body to burn fat instead of muscle for healthy weight loss Prevent hair shedding Reduce hunger between meals  Because your stomach volume will be greatly reduced after surgery, it will be important to make protein your priority at every meal and snack. Begin developing the habit now of including protein at every meal and snack.  Aim for at least 150 minutes of physical activity at moderate to high intensity, spread out over the week. The intensity of physical activity is the level of effort we use during the activity. Heart rate is a good measure of intensity. Raising the intensity of our physical activity increases our heart rate and improves how well our heart works. The heart is a muscle. And just as with any muscle, we must exercise it to make it stronger. We exercise the heart by making it beat faster than normal. As your heart becomes stronger, you'll notice that it's easier for you to do things, like walking up stairs while carrying groceries. The reason is that,  as your heart becomes stronger, your aerobic fitness improves. Aerobic fitness means that your heart does a good job of pumping oxygen through your blood to your other muscles.  Slowing down at meal time  Pre-Op Goals Progress & New Goals Re-engage: Increase physical activity; increase to 3 days per week  for 30 minutes or more (scratch Fridays and try Saturday morning) Continue: Aim for 64 ounces of hydrating fluids; no soda Continue: eat breakfast (yogurt, overnight oats, etc...); avoid skipping meals New: increase protein; track protein; aim for 60 grams per day; have a protein with every meal and snack Future (back of the mind): slow down; take 20-30 minutes to eat; practice not drinking with meals.  Handouts Provided Include  Health Benefits of Physical Activity  Learning Style & Readiness for Change Teaching method utilized: Visual & Auditory  Demonstrated degree of understanding via: Teach Back  Readiness Level: preparation Barriers to learning/adherence to lifestyle change: nothing identified  RD's Notes for next Visit  Patient progress toward chosen goals  MONITORING & EVALUATION Dietary intake, weekly physical activity, body weight, and pre-op goals in 1 month.   Next Steps  Patient is to return to [facility] in 1 month for for next SWL visit.

## 2023-09-04 ENCOUNTER — Ambulatory Visit (HOSPITAL_COMMUNITY): Admitting: Licensed Clinical Social Worker

## 2023-09-04 ENCOUNTER — Encounter (HOSPITAL_COMMUNITY): Payer: Self-pay

## 2023-09-04 DIAGNOSIS — F411 Generalized anxiety disorder: Secondary | ICD-10-CM | POA: Diagnosis not present

## 2023-09-04 NOTE — Progress Notes (Unsigned)
 THERAPIST PROGRESS NOTE  Session Time:***  Type of Therapy: {CHL AMB BH Type of Therapy:21022741}  Purpose of session/Treatment Goals addressed: ***  Interventions: Therapist utilized CBT and  to address ***. Therapist provided support and empathy to patient during session.    Effectiveness: Patient was oriented x4 (person, place, situation, and time) . Patient was  {BHH AFFECT:22266}. Patient was {Appearance:22683}  Patient engaged in session. Patient responded well to interventions. Patient continues to meet criteria for *** Patient will continue in outpatient therapy due to being the least restrictive service to meet her needs. Patient made *** progress on *** goals at this time.      09/04/2023    3:56 PM 07/10/2023    3:34 PM 08/12/2022    3:00 PM 09/10/2021   12:52 PM 10/28/2020   10:03 AM  Depression screen PHQ 2/9  Decreased Interest 2 0 0 0 0  Down, Depressed, Hopeless 1 0 0 0 0  PHQ - 2 Score 3 0 0 0 0  Altered sleeping 3      Tired, decreased energy 2      Change in appetite 2      Feeling bad or failure about yourself  2      Trouble concentrating 1      Moving slowly or fidgety/restless 1      Suicidal thoughts 0      PHQ-9 Score 14           09/04/2023    3:57 PM  GAD 7 : Generalized Anxiety Score  Nervous, Anxious, on Edge 3  Control/stop worrying 2  Worry too much - different things 2  Trouble relaxing 1  Restless 1  Easily annoyed or irritable 2  Afraid - awful might happen 2  Total GAD 7 Score 13  Anxiety Difficulty Somewhat difficult        Suicidal/Homicidal:  {BHH YES OR NO:22294} without intent/plan   Other (specify): None  Protective Factors: responsibility to others (children, family), hope for the future, and life satisfaction  Plan: Patient will return in 2-4 weeks.   Diagnosis: Axis I: Generalized anxiety disorder    Collaboration of Care: {BH OP Collaboration of Care:21014065}  Patient/Guardian was advised Release of Information must  be obtained prior to any record release in order to collaborate their care with an outside provider. Patient/Guardian was advised if they have not already done so to contact the registration department to sign all necessary forms in order for [place] to release information regarding their care.   Consent: Patient/Guardian gives verbal consent for treatment and assignment of benefits for services provided during this visit. Patient/Guardian expressed understanding and agreed to proceed.

## 2023-09-12 ENCOUNTER — Ambulatory Visit (HOSPITAL_COMMUNITY): Payer: 59 | Admitting: Licensed Clinical Social Worker

## 2023-09-21 ENCOUNTER — Ambulatory Visit: Payer: 59 | Admitting: Dietician

## 2023-09-25 ENCOUNTER — Encounter: Attending: Family Medicine | Admitting: Dietician

## 2023-09-25 VITALS — Ht 67.0 in | Wt 275.3 lb

## 2023-09-25 DIAGNOSIS — E669 Obesity, unspecified: Secondary | ICD-10-CM | POA: Insufficient documentation

## 2023-09-25 NOTE — Progress Notes (Signed)
 Supervised Weight Loss Visit Bariatric Nutrition Education Appt Start Time: 1644    End Time: 1712  Planned surgery: Sleeve Gastrectomy Pt expectation of surgery: feel better, and under 200, be able to move more without being out of breath.  3 out of 3 SWL Appointments   Pt completed visits.   Pt has cleared nutrition requirements.    NUTRITION ASSESSMENT   Anthropometrics  Start weight at [facility]: 275.2 lbs (date: 07/10/2023)  Height: 67 in Weight today: 275.3 lb BMI: 43.12 kg/m2     Clinical   Pharmacotherapy: History of weight loss medication used: phentermine (stopped taking, stating she took it twice and never could get on schedule to take it.  Medical hx: obesity, asthma Medications: venlafaxine, cetirizine, montelukast, topamax, vit D, kyleena (IUD)   Labs: triglycerides 343.0, VLDL 68.6 Notable signs/symptoms: none noted Any previous deficiencies? No  Lifestyle & Dietary Hx  Pt states she has been tracking her protein, stating she has been eating chicken and pork, stating she can put that in anything. Pt states she is getting 60 grams of protein 4-5 days of the week. Pt states she has been paying more attention to getting fluid and tracking protein. Pt states she has done well with physical activity and now has an accountability partner. Pt states she is not drinking soda, stating she might have one once in awhile. Pt states she needs to find a protein shake she likes. Pt states she feels more comfortable about the surgery, compared to the when she first entered the pathway.  Estimated daily fluid intake: close to 60 oz Supplements: vit D; vit C Current average weekly physical activity: 3 days per week for 60 minutes at the gym  24-Hr Dietary Recall First Meal: skip or fast food (biscuit with chicken) Snack:  Second Meal: chicken sandwich with broccoli and peaches Snack:  Third Meal: eat out ([employer] sometimes) or [employer] Snack: something sweet, 1/2 pint of  ice cream Beverages: soda (regular) specifically [facility] or water with no sugar flavorings  Alcoholic beverages per week: rarely (one a month or every other month)   Estimated Energy Needs Calories: 1500  NUTRITION DIAGNOSIS  Overweight/obesity ([place]-3.3) related to past poor dietary habits and physical inactivity as evidenced by patient w/ planned sleeve surgery following dietary guidelines for continued weight loss.  NUTRITION INTERVENTION  Nutrition counseling (C-1) and education (E-2) to facilitate bariatric surgery goals.  Pre-Op Goals Reviewed with the Patient  Encouraged patient to honor their body's internal hunger and fullness cues.  Throughout the day, check in mentally and rate hunger. Stop eating when satisfied not full regardless of how much food is left on the plate.  Get more if still hungry 20-30 minutes later.  The key is to honor satisfaction so throughout the meal, rate fullness factor and stop when comfortably satisfied not physically full. The key is to honor hunger and fullness without any feelings of guilt or shame.  Pay attention to what the internal cues are, rather than any external factors. This will enhance the confidence you have in listening to your own body and following those internal cues enabling you to increase how often you eat when you are hungry not out of appetite and stop when you are satisfied not full.  Encouraged pt to continue to drink a minium 64 fluid ounces with half being plain water to satisfy proper hydration   To prepare for bariatric surgery, continue tracking your protein intake and aim for 60-80 grams daily, focusing  on lean protein sources and potentially incorporating protein supplements, as recommended by your bariatric team.  Before bariatric surgery, you should avoid sugary beverages and focus on clear, sugar-free liquids like water, broth, and sugar-free drinks to prepare your body for the procedure and promote healthy weight loss.    Pre-Op Goals Progress & New Goals Continue: Increase physical activity; increase to 3 days per week for 30 minutes or more (scratch Fridays and try Saturday morning) Continue: Aim for 64 ounces of hydrating fluids; no soda Continue: eat breakfast (yogurt, overnight oats, etc...); avoid skipping meals Continue: increase protein; track protein; aim for 60 grams per day; have a protein with every meal and snack Continue: slow down; take 20-30 minutes to eat; practice not drinking with meals.  Handouts Provided Include  Health Benefits of Physical Activity  Learning Style & Readiness for Change Teaching method utilized: Visual & Auditory  Demonstrated degree of understanding via: Teach Back  Readiness Level: preparation Barriers to learning/adherence to lifestyle change: nothing identified  RD's Notes for next Visit  Patient progress toward chosen goals  MONITORING & EVALUATION Dietary intake, weekly physical activity, body weight, and pre-op goals in 1 month.   Next Steps  Pt has completed visits. No further supervised visits required/recommended. Patient is to return to [facility] for pre-op class >2 weeks prior to scheduled surgery.

## 2023-09-26 NOTE — Progress Notes (Unsigned)
 [facility] [address] [place], [place] [address] [phone]  Date:  09/27/2023   Name:  Kayla Hayes   DOB:  11/26/2000   MRN:  [id number]  PCP:  Pearline Cables, MD    Chief Complaint: urinary issues (Pt says this started in December when she had to take Prednisone multiple times. She notices she has to use the restroom frequently. )   History of Present Illness:  Kayla Hayes is a 23 y.o. very pleasant female patient who presents with the following:  Patient seen today with urinary symptoms-most recent visit with myself was in [place] of pulmonary embolism in the setting of COVID-19 infection and lupus anticoagulant positive/elevated cardiolipin IgM-she was treated with Xarelto for 2 years, this was discontinued in January 2023. 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  However since she finished the pred she feels like she has to urinate "all the time"- she is not having incontinence but has to run for the restroom This has been bothering her for 2-3 moths now; present nearly every day Occasional dysuria- mid No hematuria She notes just her typical vaginal discharge  She did start a probioitc which is supposed to be good for vaginal health recentlyu   She is getting gastric sleeve in June - she is excited about this   Lab Results  Component Value Date   HGBA1C 5.5 04/19/2023     Patient Active Problem List   Diagnosis Date Noted   Bilateral pulmonary embolism (HCC)  05/24/2019   COVID-19 virus infection 05/24/2019   Thrombocytopenia (HCC) 05/24/2019   Anxiety 11/29/2018   Asthma 08/08/2012    Past Medical History:  Diagnosis Date   Allergy    Anxiety    Asthma    Clotting disorder (HCC) 05/2019   Pulmonary emboli (HCC) 05/2019   both lungs    Past Surgical History:  Procedure Laterality Date   MOUTH SURGERY     wisdom teeth    Social History   Tobacco Use   Smoking status: Never   Smokeless tobacco: Never  Vaping Use   Vaping status: Never Used  Substance Use Topics   Alcohol use: No   Drug use: No    Family History  Problem Relation Age of Onset   Diabetes Maternal Grandmother    Diabetes Maternal Grandfather    Heart disease Paternal Grandfather    Colon cancer Neg Hx    Esophageal cancer Neg Hx    Rectal cancer Neg Hx    Stomach cancer Neg Hx     No Known Allergies  Medication list has been reviewed and updated.  Current Outpatient Medications on File Prior to Visit  Medication Sig Dispense Refill   albuterol (VENTOLIN HFA) 108 (90 Base) MCG/ACT inhaler Inhale 2 puffs into the lungs every 4 (four) hours as needed. 18 g 5   cetirizine (ZYRTEC) 10 MG tablet TAKE 1 TABLET (10 MG TOTAL) BY MOUTH AT BEDTIME. USE AS NEEDED  FOR ALLERGIES 30 tablet 11   levonorgestrel (KYLEENA) 19.5 MG IUD by Intrauterine route once.     methocarbamol (ROBAXIN) 500 MG tablet Take 1 tablet (500 mg total) by mouth every 8 (eight) hours as needed for muscle spasms. 30 tablet 0   montelukast (SINGULAIR) 10 MG tablet TAKE 1 TABLET BY MOUTH EVERYDAY AT BEDTIME 90 tablet 3   SUMAtriptan (IMITREX) 100 MG tablet Take 1 tablet (100 mg total) by mouth every 2 (two) hours as needed for migraine. May repeat in 2 hours if headache persists or recurs.  Max 200 mg in 24 hours 20 tablet 4   topiramate (TOPAMAX) 50 MG tablet Take 50 mg by mouth twice daily. To start taper up dose gradually as directed by doctor 180 tablet 1   venlafaxine XR (EFFEXOR-XR) 150  MG 24 hr capsule Take 1 capsule (150 mg total) by mouth daily with breakfast. 90 capsule 3   No current facility-administered medications on file prior to visit.    Review of Systems:  As per HPI- otherwise negative.   Physical Examination: Vitals:   09/27/23 1537  BP: 122/80  Pulse: 93  Resp: 18  Temp: 97.6 F (36.4 C)  SpO2: 98%   Vitals:   09/27/23 1537  Weight: 276 lb (125.2 kg)  Height: 5\' 7"  (1.702 m)   Body mass index is 43.23 kg/m. Ideal Body Weight: Weight in (lb) to have BMI = 25: 159.3  GEN: no acute distress.  Obese, looks well  HEENT: Atraumatic, Normocephalic.  Ears and Nose: No external deformity. CV: RRR, No M/G/R. No JVD. No thrill. No extra heart sounds. PULM: CTA B, no wheezes, crackles, rhonchi. No retractions. No resp. distress. No accessory muscle use. ABD: S, NT, ND, +BS. No rebound. No HSM. Belly is benign  EXTR: No c/c/e PSYCH: Normally interactive. Conversant.  Normal vulva, vagina and cervix, IUD string visible   Assessment and Plan: Preoperative examination - Plan: Iron, TIBC and Ferritin Panel, H. pylori breath test  Urinary frequency - Plan: Urine Culture, POCT urinalysis dipstick, POCT urine pregnancy, Hemoglobin A1c, TSH, Cervicovaginal ancillary only( [place]), Basic metabolic panel with GFR  Pt seen today with concern of urinary frequency UA is reassuring, vaginal swab and urine culture pending Will plan further follow- up pending labs.  Iron and H pylori need to go to Gaynelle Adu with [facility]  Signed Abbe Amsterdam, MD  Results for orders placed or performed in visit on 04/25/23  HM PAP SMEAR   Collection Time: 10/05/22 12:00 AM  Result Value Ref Range   HM Pap smear normal   Results Console HPV   Collection Time: 10/05/22 12:00 AM  Result Value Ref Range   CHL HPV Negative

## 2023-09-26 NOTE — Patient Instructions (Incomplete)
 It was good to see you again today- I will be in touch with your results asap, we will try to figure out these urinary symptoms!

## 2023-09-27 ENCOUNTER — Ambulatory Visit: Admitting: Family Medicine

## 2023-09-27 ENCOUNTER — Encounter: Payer: Self-pay | Admitting: Family Medicine

## 2023-09-27 ENCOUNTER — Other Ambulatory Visit (HOSPITAL_COMMUNITY)
Admission: RE | Admit: 2023-09-27 | Discharge: 2023-09-27 | Disposition: A | Discharge status: Home / Self Care | Source: Ambulatory Visit | Attending: Family Medicine | Admitting: Family Medicine

## 2023-09-27 VITALS — BP 122/80 | HR 93 | Temp 97.6°F | Resp 18 | Ht 67.0 in | Wt 276.0 lb

## 2023-09-27 DIAGNOSIS — R350 Frequency of micturition: Secondary | ICD-10-CM | POA: Diagnosis present

## 2023-09-27 DIAGNOSIS — Z01818 Encounter for other preprocedural examination: Secondary | ICD-10-CM

## 2023-09-27 LAB — POCT URINALYSIS DIP (MANUAL ENTRY)
Bilirubin, UA: NEGATIVE
Blood, UA: NEGATIVE
Glucose, UA: NEGATIVE mg/dL
Ketones, POC UA: NEGATIVE mg/dL
Leukocytes, UA: NEGATIVE
Nitrite, UA: NEGATIVE
Protein Ur, POC: NEGATIVE mg/dL
Spec Grav, UA: 1.025 (ref 1.010–1.025)
Urobilinogen, UA: 0.2 U/dL — AB
pH, UA: 6 (ref 5.0–8.0)

## 2023-09-27 LAB — POCT URINE PREGNANCY: Preg Test, Ur: NEGATIVE

## 2023-09-28 ENCOUNTER — Encounter: Payer: Self-pay | Admitting: Family Medicine

## 2023-09-28 ENCOUNTER — Ambulatory Visit (HOSPITAL_COMMUNITY): Admitting: Licensed Clinical Social Worker

## 2023-09-28 DIAGNOSIS — F411 Generalized anxiety disorder: Secondary | ICD-10-CM

## 2023-09-28 LAB — BASIC METABOLIC PANEL WITH GFR
BUN: 10 mg/dL (ref 6–23)
CO2: 26 meq/L (ref 19–32)
Calcium: 9.4 mg/dL (ref 8.4–10.5)
Chloride: 107 meq/L (ref 96–112)
Creatinine, Ser: 0.8 mg/dL (ref 0.40–1.20)
GFR: 103.25 mL/min (ref >60.00)
Glucose, Bld: 102 mg/dL — ABNORMAL HIGH (ref 70–99)
Potassium: 3.9 meq/L (ref 3.5–5.1)
Sodium: 143 meq/L (ref 135–145)

## 2023-09-28 LAB — IRON,TIBC AND FERRITIN PANEL
%SAT: 14 % — ABNORMAL LOW (ref 16–45)
Ferritin: 12 ng/mL — ABNORMAL LOW (ref 16–154)
Iron: 52 ug/dL (ref 40–190)
TIBC: 378 ug/dL (ref 250–450)

## 2023-09-28 LAB — TSH: TSH: 2.53 u[IU]/mL (ref 0.35–5.50)

## 2023-09-28 LAB — URINE CULTURE
MICRO NUMBER:: 16308682
SPECIMEN QUALITY:: ADEQUATE

## 2023-09-28 LAB — HEMOGLOBIN A1C: Hgb A1c MFr Bld: 5.3 % (ref 4.6–6.5)
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
## 2023-09-28 NOTE — Addendum Note (Signed)
 Addended by: Mervin Kung A on: 09/28/2023 08:08 AM   Modules accepted: Orders

## 2023-09-29 ENCOUNTER — Encounter: Payer: Self-pay | Admitting: Family Medicine

## 2023-10-02 ENCOUNTER — Other Ambulatory Visit (INDEPENDENT_AMBULATORY_CARE_PROVIDER_SITE_OTHER)

## 2023-10-02 DIAGNOSIS — Z01818 Encounter for other preprocedural examination: Secondary | ICD-10-CM | POA: Diagnosis not present

## 2023-10-03 LAB — H. PYLORI BREATH TEST: H. pylori Breath Test: NOT DETECTED

## 2023-10-03 LAB — CERVICOVAGINAL ANCILLARY ONLY
Bacterial Vaginitis (gardnerella): NEGATIVE
Candida Glabrata: NEGATIVE
Candida Vaginitis: POSITIVE — AB
Chlamydia: NEGATIVE
Comment: NEGATIVE
Comment: NEGATIVE
Comment: NEGATIVE
Comment: NEGATIVE
Comment: NEGATIVE
Comment: NORMAL
Neisseria Gonorrhea: NEGATIVE
Trichomonas: NEGATIVE

## 2023-10-04 ENCOUNTER — Ambulatory Visit (HOSPITAL_COMMUNITY): Admitting: Licensed Clinical Social Worker

## 2023-10-04 ENCOUNTER — Encounter: Payer: Self-pay | Admitting: Family Medicine

## 2023-10-04 ENCOUNTER — Other Ambulatory Visit: Payer: Self-pay | Admitting: Family Medicine

## 2023-10-04 MED ORDER — FLUCONAZOLE 150 MG PO TABS: 150.0000 mg | ORAL_TABLET | Freq: Every day | ORAL | 0 refills | Status: DC

## 2023-10-19 ENCOUNTER — Ambulatory Visit (HOSPITAL_COMMUNITY): Admitting: Licensed Clinical Social Worker

## 2023-10-19 DIAGNOSIS — F411 Generalized anxiety disorder: Secondary | ICD-10-CM | POA: Diagnosis not present

## 2023-10-19 NOTE — Progress Notes (Signed)
 THERAPIST PROGRESS NOTE  Session Time: 3:03 pm-3:49 pm  Type of Therapy: Individual Therapy  Purpose of session/Treatment Goals addressed: Kamela will manage anxiety as evidenced establishing boundaries and reducing people pleasing, managing panic attacks, identifying and managing anxious thoughts, improve sleep and improve feelings about self for 5 out of 7 days for 60 days.   Interventions: Therapist utilized CBT, ACT,  and Solution Focused brief therapy to address anxiety. Therapist provided support and empathy to patient during session. Therapist administered the PHQ9 and GAD7 to patient.  Therapist worked with patient on improving feelings about self and managing anxious thoughts.   Effectiveness: Patient was oriented x4 (person, place, situation, and time) . Patient was  Appropriate. Patient was Casually dressed. Patient completed the PHQ9 and GAD7.  Patient noted that she was doing better. She has been "coasting" for the past few weeks and it feels nice but also weird. She feels like she is living a "normal" life for the first time but she is waiting for the other shoe to drop. Patient noted that she got a job with her school. 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 She is excited about this but nervous about going into that department. There are 3 teachers there that are close knit and they never have any issues with. She worries about failing next year and having others see her fail. She sets high expectations for herself and tells others her expectations. Patient is going to learn from her experience this year and do things different from the beginning of the year. She is also going to work on having realistic expectations for herself and her classroom. Patient is capable and is living out the life she had hoped to have as a teenager.   Patient engaged in session. Patient responded well to interventions.  Patient continues to meet criteria for Generalized anxiety disorder  Patient will continue in outpatient therapy due to being the least restrictive service to meet her needs. Patient made minimal progress on her goals at this time.      09/28/2023    3:03 PM 09/04/2023    3:56 PM 07/10/2023    3:34 PM 08/12/2022    3:00 PM 09/10/2021   12:52 PM  Depression screen PHQ 2/9  Decreased Interest 1 2 0 0 0  Down, Depressed, Hopeless 0 1 0 0 0  PHQ - 2 Score 1 3 0 0 0  Altered sleeping 2 3     Tired, decreased energy 3 2     Change in appetite 1 2     Feeling bad or failure about yourself  2 2     Trouble concentrating 1 1     Moving slowly or fidgety/restless 1 1     Suicidal thoughts 0 0     PHQ-9 Score 11 14          09/28/2023    3:03 PM 09/04/2023    3:57 PM  GAD 7 : Generalized Anxiety Score  Nervous, Anxious, on Edge 2 3  Control/stop worrying 2 2  Worry too much - different things 2 2  Trouble relaxing 1 1  Restless 1 1  Easily annoyed or irritable 2 2  Afraid - awful might happen 1 2  Total GAD 7 Score 11 13  Anxiety Difficulty Somewhat difficult Somewhat difficult        Suicidal/Homicidal:  No without intent/plan  Other (specify): None  Protective Factors: responsibility to others (children, family), hope for the future, and life satisfaction  Plan: Patient will return in 2-4 weeks.   Diagnosis: Axis I: Generalized anxiety disorder    Collaboration of Care: Other provider involved in patient's care [facility]  Patient/Guardian was advised Release of Information must be obtained prior to any record release in order to collaborate their care with an outside provider. Patient/Guardian was advised if they have not already done so to contact the registration department to sign all necessary forms in order for us  to release information regarding their care.   Consent: Patient/Guardian gives verbal consent for treatment and assignment of benefits for  services provided during this visit. Patient/Guardian expressed understanding and agreed to proceed.

## 2023-10-23 ENCOUNTER — Encounter (HOSPITAL_BASED_OUTPATIENT_CLINIC_OR_DEPARTMENT_OTHER): Payer: Self-pay | Admitting: Emergency Medicine

## 2023-10-23 ENCOUNTER — Ambulatory Visit
Admission: RE | Admit: 2023-10-23 | Discharge: 2023-10-23 | Disposition: A | Discharge status: Home / Self Care | Source: Ambulatory Visit | Attending: Emergency Medicine | Admitting: Emergency Medicine

## 2023-10-23 ENCOUNTER — Other Ambulatory Visit: Payer: Self-pay

## 2023-10-23 ENCOUNTER — Emergency Department (HOSPITAL_BASED_OUTPATIENT_CLINIC_OR_DEPARTMENT_OTHER)
Admission: EM | Admit: 2023-10-23 | Discharge: 2023-10-24 | Disposition: A | Discharge status: Home / Self Care | Attending: Emergency Medicine | Admitting: Emergency Medicine

## 2023-10-23 VITALS — BP 176/110 | HR 100 | Temp 97.8°F | Resp 18

## 2023-10-23 DIAGNOSIS — R519 Headache, unspecified: Secondary | ICD-10-CM | POA: Diagnosis present

## 2023-10-23 DIAGNOSIS — G43019 Migraine without aura, intractable, without status migrainosus: Secondary | ICD-10-CM | POA: Diagnosis not present

## 2023-10-23 DIAGNOSIS — G43901 Migraine, unspecified, not intractable, with status migrainosus: Secondary | ICD-10-CM | POA: Diagnosis not present

## 2023-10-23 LAB — PREGNANCY, URINE: Preg Test, Ur: NEGATIVE

## 2023-10-23 MED ORDER — ONDANSETRON 4 MG PO TBDP: 4.0000 mg | ORAL_TABLET | Freq: Three times a day (TID) | ORAL | 0 refills | Status: DC | PRN

## 2023-10-23 MED ORDER — DEXAMETHASONE SODIUM PHOSPHATE 10 MG/ML IJ SOLN
10.0000 mg | Freq: Once | INTRAMUSCULAR | Status: AC
  Administered 2023-10-23: 10 mg via INTRAVENOUS
  Filled 2023-10-23: qty 1

## 2023-10-23 MED ORDER — MAGNESIUM SULFATE 2 GM/50ML IV SOLN
2.0000 g | Freq: Once | INTRAVENOUS | Status: AC
  Administered 2023-10-23: 2 g via INTRAVENOUS
  Filled 2023-10-23: qty 50

## 2023-10-23 MED ORDER — DIPHENHYDRAMINE HCL 50 MG/ML IJ SOLN
25.0000 mg | Freq: Once | INTRAMUSCULAR | Status: AC
  Administered 2023-10-23: 25 mg via INTRAVENOUS
  Filled 2023-10-23: qty 1

## 2023-10-23 MED ORDER — KETOROLAC TROMETHAMINE 30 MG/ML IJ SOLN
30.0000 mg | Freq: Once | INTRAMUSCULAR | Status: AC
  Administered 2023-10-23: 30 mg via INTRAMUSCULAR

## 2023-10-23 MED ORDER — DIAZEPAM 5 MG/ML IJ SOLN
5.0000 mg | Freq: Once | INTRAMUSCULAR | Status: AC
  Administered 2023-10-23: 5 mg via INTRAVENOUS
  Filled 2023-10-23: qty 2

## 2023-10-23 MED ORDER — ONDANSETRON 4 MG PO TBDP
4.0000 mg | ORAL_TABLET | Freq: Once | ORAL | Status: AC
  Administered 2023-10-23: 4 mg via ORAL

## 2023-10-23 MED ORDER — METOCLOPRAMIDE HCL 10 MG PO TABS: 10.0000 mg | ORAL_TABLET | Freq: Two times a day (BID) | ORAL | 0 refills | Status: DC | PRN

## 2023-10-23 MED ORDER — SUMATRIPTAN SUCCINATE 6 MG/0.5ML ~~LOC~~ SOLN
6.0000 mg | Freq: Once | SUBCUTANEOUS | Status: AC
  Administered 2023-10-23: 6 mg via SUBCUTANEOUS

## 2023-10-23 MED ORDER — METOCLOPRAMIDE HCL 5 MG/ML IJ SOLN
10.0000 mg | Freq: Once | INTRAMUSCULAR | Status: AC
  Administered 2023-10-23: 10 mg via INTRAVENOUS
  Filled 2023-10-23: qty 2

## 2023-10-23 MED ORDER — SODIUM CHLORIDE 0.9 % IV BOLUS
1000.0000 mL | Freq: Once | INTRAVENOUS | Status: AC
  Administered 2023-10-23: 1000 mL via INTRAVENOUS

## 2023-10-23 MED ORDER — IPRATROPIUM BROMIDE 0.03 % NA SOLN: 2.0000 | Freq: Two times a day (BID) | NASAL | 0 refills | Status: AC

## 2023-10-23 MED ORDER — DICLOFENAC SODIUM 75 MG PO TBEC: 75.0000 mg | DELAYED_RELEASE_TABLET | Freq: Two times a day (BID) | ORAL | 0 refills | Status: DC

## 2023-10-23 NOTE — ED Provider Notes (Signed)
 [facility] AT [facility] Provider Note   CSN: [id number] Arrival date & time: 10/23/23  1817     History  Chief Complaint  Patient presents with   Migraine    Kayla Hayes is a 23 y.o. female, history of ocular migraines, who presents to the ED secondary to a headache behind her left eye, and all over her head, this been going on for the last 5 days.  She states that she has a squeezing sensation, all around her head, and then stabbing pain, behind her left eye.  She denies any nausea, vomiting, photophobia, phonophobia, rhinorrhea, or increased sweat, the face.  She notes that she typically gets a aura prior to her migraines, and this happened on Thursday, and she started seeing squiggly lines, that were only there for about 5 minutes and resolved.  He states since then, that headache has not gone away with her Imitrex , Topamax , or ibuprofen  or Tylenol .  She did go to urgent care today, and they have her a shot of Toradol , and a dose of Imitrex , and she states that resolved it, but then it came back worse, after waking up.  She has no weakness on one side of the body, no facial droop, no change in speech, or confusion.  She is not having any dizziness.  Home Medications Prior to Admission medications   Medication Sig Start Date End Date Taking? Authorizing Provider  metoCLOPramide  (REGLAN ) 10 MG tablet Take 1 tablet (10 mg total) by mouth 2 (two) times daily as needed for nausea, vomiting or refractory nausea / vomiting (headache). 10/23/23  Yes Sejla Marzano L, PA  albuterol  (VENTOLIN  HFA) 108 (90 Base) MCG/ACT inhaler Inhale 2 puffs into the lungs every 4 (four) hours as needed. 12/14/22   Copland, Skipper Dumas, MD  cetirizine  (ZYRTEC ) 10 MG tablet TAKE 1 TABLET (10 MG TOTAL) BY MOUTH AT BEDTIME. USE AS NEEDED FOR ALLERGIES 10/31/22   Copland, Skipper Dumas, MD  diclofenac  (VOLTAREN ) 75 MG EC tablet Take 1 tablet (75 mg total) by mouth 2 (two) times daily. 10/23/23    White, Maybelle Spatz, NP  fluconazole  (DIFLUCAN ) 150 MG tablet Take 1 tablet (150 mg total) by mouth daily. Repeat in 1 week if needed 10/04/23   Copland, Jessica C, MD  ipratropium (ATROVENT ) 0.03 % nasal spray Place 2 sprays into both nostrils every 12 (twelve) hours. 10/23/23   White, Maybelle Spatz, NP  levonorgestrel  (KYLEENA ) 19.5 MG IUD by Intrauterine route once.    [provider]  methocarbamol  (ROBAXIN ) 500 MG tablet Take 1 tablet (500 mg total) by mouth every 8 (eight) hours as needed for muscle spasms. 06/15/20   Copland, Skipper Dumas, MD  montelukast  (SINGULAIR ) 10 MG tablet TAKE 1 TABLET BY MOUTH EVERYDAY AT BEDTIME 10/31/22   Copland, Skipper Dumas, MD  ondansetron  (ZOFRAN -ODT) 4 MG disintegrating tablet Take 1 tablet (4 mg total) by mouth every 8 (eight) hours as needed. 10/23/23   White, Maybelle Spatz, NP  SUMAtriptan  (IMITREX ) 100 MG tablet Take 1 tablet (100 mg total) by mouth every 2 (two) hours as needed for migraine. May repeat in 2 hours if headache persists or recurs.  Max 200 mg in 24 hours 04/19/23   Copland, Skipper Dumas, MD  topiramate  (TOPAMAX ) 50 MG tablet Take 50 mg by mouth twice daily. To start taper up dose gradually as directed by doctor 05/09/23   Copland, Skipper Dumas, MD  venlafaxine  XR (EFFEXOR -XR) 150 MG 24 hr capsule Take 1 capsule (  150 mg total) by mouth daily with breakfast. 04/19/23   Copland, Skipper Dumas, MD      Allergies    Patient has no known allergies.    Review of Systems   Review of Systems  Neurological:  Positive for headaches. Negative for facial asymmetry.    Physical Exam Updated Vital Signs BP (!) 157/82   Pulse 73   Temp 98 F (36.7 C)   Resp 18   LMP 10/12/2023 (Approximate)   SpO2 99%  Physical Exam Vitals and nursing note reviewed.  Constitutional:      General: She is not in acute distress.    Appearance: She is well-developed.  HENT:     Head: Normocephalic and atraumatic.  Eyes:     Conjunctiva/sclera: Conjunctivae normal.   Cardiovascular:     Rate and Rhythm: Normal rate and regular rhythm.     Heart sounds: No murmur heard. Pulmonary:     Effort: Pulmonary effort is normal. No respiratory distress.     Breath sounds: Normal breath sounds.  Abdominal:     Palpations: Abdomen is soft.     Tenderness: There is no abdominal tenderness.  Musculoskeletal:        General: No swelling.     Cervical back: Neck supple.  Skin:    General: Skin is warm and dry.     Capillary Refill: Capillary refill takes less than 2 seconds.  Neurological:     General: No focal deficit present.     Mental Status: She is alert and oriented to person, place, and time.  Psychiatric:        Mood and Affect: Mood normal.     ED Results / Procedures / Treatments   Labs (all labs ordered are listed, but only abnormal results are displayed) Labs Reviewed  PREGNANCY, URINE    EKG None  Radiology No results found.  Procedures Procedures    Medications Ordered in ED Medications  metoCLOPramide  (REGLAN ) injection 10 mg (10 mg Intravenous Given 10/23/23 2201)  diphenhydrAMINE  (BENADRYL ) injection 25 mg (25 mg Intravenous Given 10/23/23 2201)  sodium chloride  0.9 % bolus 1,000 mL (0 mLs Intravenous Stopped 10/23/23 2337)  dexamethasone  (DECADRON ) injection 10 mg (10 mg Intravenous Given 10/23/23 2202)  magnesium  sulfate IVPB 2 g 50 mL (0 g Intravenous Stopped 10/23/23 2337)  diazepam  (VALIUM ) injection 5 mg (5 mg Intravenous Given 10/23/23 2246)    ED Course/ Medical Decision Making/ A&P                                 Medical Decision Making Patient is a 23 year old female, was seen in urgent care earlier today, for headache has been going on for 5 days.  She has no neurodeficits on my exam, and is well-appearing, she has been having some stabbing pain, and has not an aura associated with this.  History of ocular migraines.  No relief with Imitrex , Topamax .  Did have some relief a little bit of with Toradol  earlier.  We will do  Reglan , Benadryl  and fluids, see if any relief.  Reevaluated the patient, minimal relief, I placed magnesium  sulfate, and a Valium , for the patient, as she was very upset, crying profusely, saying she had severe anxiety.  After reevaluation, the headache is greatly improved and is a 3 out of 10, and she is resting peacefully.  Discharged home with strict return precautions  Amount and/or Complexity of Data Reviewed Labs:  ordered.  Risk Prescription drug management.    Final Clinical Impression(s) / ED Diagnoses Final diagnoses:  Migraine with status migrainosus, not intractable, unspecified migraine type    Rx / DC Orders ED Discharge Orders          Ordered    metoCLOPramide  (REGLAN ) 10 MG tablet  2 times daily PRN        10/23/23 2337              Haytham Maher, Dwaine Gip, PA 10/23/23 2338    Almond Army, MD 10/27/23 1338

## 2023-10-23 NOTE — Discharge Instructions (Signed)
 For your headache -On exam there are no abnormalities neurologically, do believe your symptoms are related to seasonal allergies -Begin ipratropium nasal spray to help reduce sinus pressure, may use twice daily -You have been given an injection of Toradol , Imitrex  and oral Zofran  here in the office today to help minimize your symptoms -You may continue use of  Tylenol  and Imitrex  at home -Use meloxicam daily to help reduce headaches, this replaces ibuprofen  -May use Zofran  every 8 hours as needed for nausea -While headaches are present ensure that you are getting adequate rest and adequate fluid intake -Participate in low stimulation activities avoiding bright lights and loud noises when symptoms are present -If your headaches continue to persist please follow-up with your primary doctor for reevaluation -At any point if you have the worst headache of your life please go to the nearest emergency department for immediate evaluation

## 2023-10-23 NOTE — ED Triage Notes (Signed)
 Patient presents to [facility] for migraine x 5 days. Treating with ibuprofen , tylenol , and imitrex . States last doses this morning at 0100.

## 2023-10-23 NOTE — ED Provider Notes (Signed)
 [facility]    CSN: [id number] Arrival date & time: 10/23/23  0900      History   Chief Complaint Chief Complaint  Patient presents with   Migraine    Entered by patient    HPI Kayla Hayes is a 23 y.o. female.   Patient presents for evaluation of a left-sided frontal headache present for 5 days.  Symptoms interfering with daily activity causing her to become bedridden as pain has been constant and persistent.  Endorses a generalized pressure but a localized throbbing to the left side, has had 1 occurrence of visual disturbance since symptoms began and persistent nausea without vomiting.  Decreased appetite.  Has attempted use of Tylenol , ibuprofen  and Imitrex .  History of ocular migraines managed by PCP.  Denies dizziness, lightheadedness, syncope, injury to the head, light or noise sensitivity.  Past Medical History:  Diagnosis Date   Allergy    Anxiety    Asthma    Clotting disorder (HCC) 05/2019   Pulmonary emboli (HCC) 05/2019   both lungs    Patient Active Problem List   Diagnosis Date Noted   Bilateral pulmonary embolism (HCC) 05/24/2019   COVID-19 virus infection 05/24/2019   Thrombocytopenia (HCC) 05/24/2019   Anxiety 11/29/2018   Asthma 08/08/2012    Past Surgical History:  Procedure Laterality Date   MOUTH SURGERY     wisdom teeth    OB History   No obstetric history on file.      Home Medications    Prior to Admission medications   Medication Sig Start Date End Date Taking? Authorizing Provider  diclofenac (VOLTAREN) 75 MG EC tablet Take 1 tablet (75 mg total) by mouth 2 (two) times daily. 10/23/23  Yes Saanya Zieske R, NP  ipratropium (ATROVENT) 0.03 % nasal spray Place 2 sprays into both nostrils every 12 (twelve) hours. 10/23/23  Yes Louana Fontenot R, NP  ondansetron  (ZOFRAN -ODT) 4 MG disintegrating tablet Take 1 tablet (4 mg total) by mouth every 8 (eight) hours as needed. 10/23/23  Yes Shante Maysonet, Maybelle Spatz, NP  albuterol  (VENTOLIN  HFA)  108 (90 Base) MCG/ACT inhaler Inhale 2 puffs into the lungs every 4 (four) hours as needed. 12/14/22   Copland, Skipper Dumas, MD  cetirizine  (ZYRTEC ) 10 MG tablet TAKE 1 TABLET (10 MG TOTAL) BY MOUTH AT BEDTIME. USE AS NEEDED FOR ALLERGIES 10/31/22   Copland, Skipper Dumas, MD  fluconazole  (DIFLUCAN ) 150 MG tablet Take 1 tablet (150 mg total) by mouth daily. Repeat in 1 week if needed 10/04/23   Copland, Skipper Dumas, MD  levonorgestrel  (KYLEENA ) 19.5 MG IUD by Intrauterine route once.    [provider]  methocarbamol  (ROBAXIN ) 500 MG tablet Take 1 tablet (500 mg total) by mouth every 8 (eight) hours as needed for muscle spasms. 06/15/20   Copland, Skipper Dumas, MD  montelukast  (SINGULAIR ) 10 MG tablet TAKE 1 TABLET BY MOUTH EVERYDAY AT BEDTIME 10/31/22   Copland, Skipper Dumas, MD  SUMAtriptan  (IMITREX ) 100 MG tablet Take 1 tablet (100 mg total) by mouth every 2 (two) hours as needed for migraine. May repeat in 2 hours if headache persists or recurs.  Max 200 mg in 24 hours 04/19/23   Copland, Skipper Dumas, MD  topiramate  (TOPAMAX ) 50 MG tablet Take 50 mg by mouth twice daily. To start taper up dose gradually as directed by doctor 05/09/23   Copland, Skipper Dumas, MD  venlafaxine  XR (EFFEXOR -XR) 150 MG 24 hr capsule Take 1 capsule (150 mg total) by mouth daily  with breakfast. 04/19/23   Copland, Skipper Dumas, MD    Family History Family History  Problem Relation Age of Onset   Diabetes Maternal Grandmother    Diabetes Maternal Grandfather    Heart disease Paternal Grandfather    Colon cancer Neg Hx    Esophageal cancer Neg Hx    Rectal cancer Neg Hx    Stomach cancer Neg Hx     Social History Social History   Tobacco Use   Smoking status: Never   Smokeless tobacco: Never  Vaping Use   Vaping status: Never Used  Substance Use Topics   Alcohol use: No   Drug use: No     Allergies   Patient has no known allergies.   Review of Systems Review of Systems   Physical Exam Triage Vital Signs ED  Triage Vitals  Encounter Vitals Group     BP 10/23/23 0931 (!) 176/110     Systolic BP Percentile --      Diastolic BP Percentile --      Pulse Rate 10/23/23 0931 100     Resp 10/23/23 0931 18     Temp 10/23/23 0931 97.8 F (36.6 C)     Temp Source 10/23/23 0931 Temporal     SpO2 10/23/23 0931 98 %     Weight --      Height --      Head Circumference --      Peak Flow --      Pain Score 10/23/23 0932 8     Pain Loc --      Pain Education --      Exclude from Growth Chart --    No data found.  Updated Vital Signs BP (!) 176/110 (BP Location: Left Arm)   Pulse 100   Temp 97.8 F (36.6 C) (Temporal)   Resp 18   LMP 10/12/2023 (Approximate)   SpO2 98%   Visual Acuity Right Eye Distance:   Left Eye Distance:   Bilateral Distance:    Right Eye Near:   Left Eye Near:    Bilateral Near:     Physical Exam Constitutional:      Appearance: Normal appearance.  HENT:     Right Ear: Tympanic membrane, ear canal and external ear normal.     Left Ear: Tympanic membrane, ear canal and external ear normal.  Eyes:     Extraocular Movements: Extraocular movements intact.     Conjunctiva/sclera: Conjunctivae normal.     Pupils: Pupils are equal, round, and reactive to light.  Pulmonary:     Effort: Pulmonary effort is normal.  Neurological:     General: No focal deficit present.     Mental Status: She is alert and oriented to person, place, and time. Mental status is at baseline.     Cranial Nerves: No cranial nerve deficit.     Sensory: No sensory deficit.     Motor: No weakness.     Coordination: Coordination normal.     Gait: Gait normal.      UC Treatments / Results  Labs (all labs ordered are listed, but only abnormal results are displayed) Labs Reviewed - No data to display  EKG   Radiology No results found.  Procedures Procedures (including critical care time)  Medications Ordered in UC Medications  ondansetron  (ZOFRAN -ODT) disintegrating tablet 4 mg  (4 mg Oral Given 10/23/23 1001)  ketorolac  (TORADOL ) 30 MG/ML injection 30 mg (30 mg Intramuscular Given 10/23/23 1002)  SUMAtriptan  (IMITREX ) injection 6  mg (6 mg Subcutaneous Given 10/23/23 1002)    Initial Impression / Assessment and Plan / UC Course  I have reviewed the triage vital signs and the nursing notes.  Pertinent labs & imaging results that were available during my care of the patient were reviewed by me and considered in my medical decision making (see chart for details).  Intractable migraine without aura and without status migrainosus  Blood pressure elevated in triage however patient in pain, tearful throughout exam, no neurological deficits, stable for outpatient management, given IM injection of Toradol , Imitrex  and oral for him, prescribe meloxicam, Zofran  and ipratropium nasal spray for home use, symptoms most likely caused by seasonal allergies, currently taking daily antihistamine and montelukast  for management, endorses pressure to the bilateral ears, on exam no signs of infection, after muscular injections given patient had vasovagal episode, vital signs remained stable, endorses that she did not eat this morning, given crackers and monitor for an additional 10 minutes, stable prior to discharge, may follow-up with urgent care as needed Final Clinical Impressions(s) / UC Diagnoses   Final diagnoses:  Intractable migraine without aura and without status migrainosus     Discharge Instructions      For your headache -On exam there are no abnormalities neurologically, do believe your symptoms are related to seasonal allergies -Begin ipratropium nasal spray to help reduce sinus pressure, may use twice daily -You have been given an injection of Toradol , Imitrex  and oral Zofran  here in the office today to help minimize your symptoms -You may continue use of  Tylenol  and Imitrex  at home -Use meloxicam daily to help reduce headaches, this replaces ibuprofen  -May use Zofran   every 8 hours as needed for nausea -While headaches are present ensure that you are getting adequate rest and adequate fluid intake -Participate in low stimulation activities avoiding bright lights and loud noises when symptoms are present -If your headaches continue to persist please follow-up with your primary doctor for reevaluation -At any point if you have the worst headache of your life please go to the nearest emergency department for immediate evaluation    ED Prescriptions     Medication Sig Dispense Auth. Provider   diclofenac (VOLTAREN) 75 MG EC tablet Take 1 tablet (75 mg total) by mouth 2 (two) times daily. 30 tablet Koleson Reifsteck R, NP   ondansetron  (ZOFRAN -ODT) 4 MG disintegrating tablet Take 1 tablet (4 mg total) by mouth every 8 (eight) hours as needed. 20 tablet Avereigh Spainhower R, NP   ipratropium (ATROVENT) 0.03 % nasal spray Place 2 sprays into both nostrils every 12 (twelve) hours. 30 mL Reena Canning, NP      PDMP not reviewed this encounter.   Reena Canning, NP 10/23/23 1022

## 2023-10-23 NOTE — Discharge Instructions (Addendum)
 I am glad you are feeling better, I have send use the medication you can take it with 800 mg of ibuprofen , and 25 mg of Benadryl.  This may help with some relief of your headache.  Please follow-up with the neurologist however, and if your pain continues, seek further evaluation in the ER

## 2023-10-23 NOTE — ED Notes (Signed)
 Patient was monitored for 25 mins post Toradol  IM injection due to feeling dizzy. Vitals obtained BP 145/88, 102 HR, 18 RR, provider notified. States she did not have breakfast and it was her first time receiving Toradol . Crackers and ice water given, cool washcloth given. She reports feeling better after eating her snack. Dc home.

## 2023-10-23 NOTE — ED Triage Notes (Signed)
 Migraine x 3 days Not relieved with home meds Had IM immitrex and tramadol and zofran  Minimal relief, slept some then migraine returned

## 2023-10-31 NOTE — Progress Notes (Unsigned)
 [facility] [address], [address] [place], [place] [address] [phone]  Date:  11/02/2023   Name:  Kayla Hayes   DOB:  [birth year]   MRN:  [id number]  PCP:  Kaylee Partridge, MD    Chief Complaint: No chief complaint on file.   History of Present Illness:  Kayla Hayes is a 23 y.o. very pleasant female patient who presents with the following:  Patient seen today with concern of migraine headache I last saw her in April to discuss planned gastric sleeve surgery Notes from last visit: History of pulmonary embolism in the setting of COVID-19 infection and lupus anticoagulant positive/elevated cardiolipin IgM-she was treated with Xarelto  for 2 years, this was discontinued in January 2023. Also history of hidradenitis suppurativa  She is finishing up her elementary education program, she should graduate next month  She was seen in urgent care and then in the ER on May 5 due to migraine-urgent care gave her Toradol  and Imitrex  and she got better for a while, but then her symptoms were worse so she went to the emergency department: Patient is a 24 year old female, was seen in urgent care earlier today, for headache has been going on for 5 days. She has no neurodeficits on my exam, and is well-appearing, she has been having some stabbing pain, and has not an aura associated with this. History of ocular migraines. No relief with Imitrex , Topamax . Did have some relief a little bit of with Toradol  earlier. We will do Reglan , Benadryl  and fluids, see if any relief. Reevaluated the patient, minimal relief, I placed magnesium  sulfate, and a Valium , for the patient, as she was very upset, crying profusely, saying she had severe anxiety. After reevaluation, the headache is greatly improved and is a 3 out of 10, and she is resting peacefully. Discharged home with strict return precautions  Patient Active Problem List   Diagnosis Date Noted    Bilateral pulmonary embolism (HCC) 05/24/2019   COVID-19 virus infection 05/24/2019   Thrombocytopenia (HCC) 05/24/2019   Anxiety 11/29/2018   Asthma 08/08/2012    Past Medical History:  Diagnosis Date   Allergy    Anxiety    Asthma    Clotting disorder (HCC) 05/2019   Pulmonary emboli (HCC) 05/2019   both lungs    Past Surgical History:  Procedure Laterality Date   MOUTH SURGERY     wisdom teeth    Social History   Tobacco Use   Smoking status: Never   Smokeless tobacco: Never  Vaping Use   Vaping status: Never Used  Substance Use Topics   Alcohol use: No   Drug use: No    Family History  Problem Relation Age of Onset   Diabetes Maternal Grandmother    Diabetes Maternal Grandfather    Heart disease Paternal Grandfather    Colon cancer Neg Hx    Esophageal cancer Neg Hx    Rectal cancer Neg Hx    Stomach cancer Neg Hx     No Known Allergies  Medication list has been reviewed and updated.  Current Outpatient Medications on File Prior to Visit  Medication Sig Dispense Refill   albuterol  (VENTOLIN  HFA) 108 (90 Base) MCG/ACT inhaler Inhale 2 puffs into the lungs every 4 (four) hours as needed. 18 g 5   cetirizine  (ZYRTEC ) 10 MG tablet TAKE 1 TABLET (10 MG TOTAL) BY MOUTH AT BEDTIME. USE AS NEEDED FOR ALLERGIES 30 tablet  11   diclofenac  (VOLTAREN ) 75 MG EC tablet Take 1 tablet (75 mg total) by mouth 2 (two) times daily. 30 tablet 0   fluconazole  (DIFLUCAN ) 150 MG tablet Take 1 tablet (150 mg total) by mouth daily. Repeat in 1 week if needed 2 tablet 0   ipratropium (ATROVENT ) 0.03 % nasal spray Place 2 sprays into both nostrils every 12 (twelve) hours. 30 mL 0   levonorgestrel  (KYLEENA ) 19.5 MG IUD by Intrauterine route once.     methocarbamol  (ROBAXIN ) 500 MG tablet Take 1 tablet (500 mg total) by mouth every 8 (eight) hours as needed for muscle spasms. 30 tablet 0   metoCLOPramide  (REGLAN ) 10 MG tablet Take 1 tablet (10 mg total) by mouth 2 (two) times daily as  needed for nausea, vomiting or refractory nausea / vomiting (headache). 30 tablet 0   montelukast  (SINGULAIR ) 10 MG tablet TAKE 1 TABLET BY MOUTH EVERYDAY AT BEDTIME 90 tablet 3   ondansetron  (ZOFRAN -ODT) 4 MG disintegrating tablet Take 1 tablet (4 mg total) by mouth every 8 (eight) hours as needed. 20 tablet 0   SUMAtriptan  (IMITREX ) 100 MG tablet Take 1 tablet (100 mg total) by mouth every 2 (two) hours as needed for migraine. May repeat in 2 hours if headache persists or recurs.  Max 200 mg in 24 hours 20 tablet 4   topiramate  (TOPAMAX ) 50 MG tablet Take 50 mg by mouth twice daily. To start taper up dose gradually as directed by doctor 180 tablet 1   venlafaxine  XR (EFFEXOR -XR) 150 MG 24 hr capsule Take 1 capsule (150 mg total) by mouth daily with breakfast. 90 capsule 3   No current facility-administered medications on file prior to visit.    Review of Systems:  As per HPI- otherwise negative.   Physical Examination: There were no vitals filed for this visit. There were no vitals filed for this visit. There is no height or weight on file to calculate BMI. Ideal Body Weight:    GEN: no acute distress. HEENT: Atraumatic, Normocephalic.  Ears and Nose: No external deformity. CV: RRR, No M/G/R. No JVD. No thrill. No extra heart sounds. PULM: CTA B, no wheezes, crackles, rhonchi. No retractions. No resp. distress. No accessory muscle use. ABD: S, NT, ND, +BS. No rebound. No HSM. EXTR: No c/c/e PSYCH: Normally interactive. Conversant.    Assessment and Plan: ***  Signed Gates Kasal, MD

## 2023-11-02 ENCOUNTER — Ambulatory Visit: Admitting: Family Medicine

## 2023-11-02 ENCOUNTER — Telehealth: Payer: Self-pay

## 2023-11-02 ENCOUNTER — Ambulatory Visit (HOSPITAL_COMMUNITY): Admitting: Licensed Clinical Social Worker

## 2023-11-02 VITALS — BP 127/81 | HR 86 | Temp 98.2°F | Resp 20 | Ht 67.0 in | Wt 278.4 lb

## 2023-11-02 DIAGNOSIS — G43101 Migraine with aura, not intractable, with status migrainosus: Secondary | ICD-10-CM | POA: Diagnosis not present

## 2023-11-02 MED ORDER — NURTEC 75 MG PO TBDP: ORAL_TABLET | ORAL | 2 refills | Status: DC

## 2023-11-02 NOTE — Patient Instructions (Signed)
 Good to see you today- I am glad your headache is improved Let's have you try Nurtec for migraine prevention and treatment.  Take every other day for 10- 14 days to make sure this headache is gone.  Then, you can take once daily as needed I think you can find a savings coupon on their website  Please let me know how this works for you

## 2023-11-02 NOTE — Telephone Encounter (Signed)
 Pharmacy Patient Advocate Encounter   Received notification from CoverMyMeds that prior authorization for Nurtec 75MG  dispersible tablets is required/requested.   Insurance verification completed.   The patient is insured through [facility] .   Per test claim: PA required; PA submitted to above mentioned insurance via CoverMyMeds Key/confirmation #/EOC [facility] Status is pending

## 2023-11-03 ENCOUNTER — Other Ambulatory Visit: Payer: Self-pay | Admitting: Family Medicine

## 2023-11-03 DIAGNOSIS — G43109 Migraine with aura, not intractable, without status migrainosus: Secondary | ICD-10-CM

## 2023-11-04 ENCOUNTER — Encounter: Payer: Self-pay | Admitting: Family Medicine

## 2023-11-04 DIAGNOSIS — G43101 Migraine with aura, not intractable, with status migrainosus: Secondary | ICD-10-CM

## 2023-11-06 NOTE — Telephone Encounter (Signed)
 Pharmacy Patient Advocate Encounter  Received notification from [facility] that Prior Authorization for Nurtec 75MG  dispersible tablets has been APPROVED from 11/02/2023 to 11/01/2024   PA #/Case ID/Reference #: [id number]

## 2023-11-15 ENCOUNTER — Ambulatory Visit (HOSPITAL_COMMUNITY): Admitting: Licensed Clinical Social Worker

## 2023-11-15 DIAGNOSIS — F411 Generalized anxiety disorder: Secondary | ICD-10-CM | POA: Diagnosis not present

## 2023-11-15 NOTE — Progress Notes (Unsigned)
 THERAPIST PROGRESS NOTE  Session Time: 4:02 pm-4:45 pm  Type of Therapy: Individual Therapy  Purpose of session/Treatment Goals addressed: Kayla Hayes will manage anxiety as evidenced establishing boundaries and reducing people pleasing, managing panic attacks, identifying and managing anxious thoughts, improve sleep and improve feelings about self for 5 out of 7 days for 60 days.   Interventions: Therapist utilized CBT, ACT,  and Solution Focused brief therapy to address anxiety. Therapist provided support and empathy to patient during session. Therapist administered the PHQ9 and GAD7 to patient.  Therapist worked with patient on establishing boundaries and reducing people pleasing.   Effectiveness: Patient was oriented x4 (person, place, situation, and time) . Patient was  Appropriate. Patient was Casually dressed. Patient completed the PHQ9 and GAD7. 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 Patient is having some frustrations at home. Her boyfriend's roommate moved his girlfriend in with her 30 yr old son. Patient noted she doesn't clean up after herself and she doesn't look after her son which falls to patient. 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 Patient is biting her tongue but also wants to have a conversation with them about maintaining their part. She worries that they will be then asked to start paying the electric bill. Patient noted that she only pays about $150 in rent a month and wants to keep it that way   Patient engaged in session. Patient responded well to interventions. Patient continues to meet criteria for Generalized anxiety disorder  Patient will continue in outpatient therapy due to being the least restrictive service to meet her needs.  Patient made minimal progress on her goals at this time.      11/15/2023    4:08 PM 10/19/2023    3:07 PM 09/28/2023    3:03 PM 09/04/2023    3:56 PM 07/10/2023    3:34 PM  Depression screen PHQ 2/9  Decreased Interest 1 1 1 2  0  Down, Depressed, Hopeless 1 1 0 1 0  PHQ - 2 Score 2 2 1 3  0  Altered sleeping 2 2 2 3    Tired, decreased energy 2 2 3 2    Change in appetite 1 1 1 2    Feeling bad or failure about yourself  2 2 2 2    Trouble concentrating 1 2 1 1    Moving slowly or fidgety/restless 0 1 1 1    Suicidal thoughts 0 0 0 0   PHQ-9 Score 10 12 11 14         11/15/2023    4:08 PM 10/19/2023    3:07 PM 09/28/2023    3:03 PM 09/04/2023    3:57 PM  GAD 7 : Generalized Anxiety Score  Nervous, Anxious, on Edge 1 2 2 3   Control/stop worrying 1 1 2 2   Worry too much - different things 1 1 2 2   Trouble relaxing 0 1 1 1   Restless 0 1 1 1   Easily annoyed or irritable 1 2 2 2   Afraid - awful might happen 0 2 1 2   Total GAD 7 Score 4 10 11 13   Anxiety Difficulty Not difficult at all Somewhat difficult Somewhat difficult Somewhat difficult        Suicidal/Homicidal:  No without intent/plan  Other (specify): None  Protective Factors: responsibility to others (children, family), hope for the future, and life satisfaction  Plan: Patient will return in 2-4 weeks.   Diagnosis: Axis I: Generalized anxiety disorder    Collaboration of Care: Other provider involved in patient's care [facility]  Patient/Guardian was advised Release of Information must be obtained prior to any record release in order to collaborate their care with an outside provider. Patient/Guardian was advised if they have not already done so to contact the registration department to sign all necessary forms in order for us  to release information regarding their care.   Consent: Patient/Guardian gives verbal consent for treatment and assignment of benefits for services provided during this visit.  Patient/Guardian expressed understanding and agreed to proceed.

## 2023-11-20 ENCOUNTER — Encounter: Attending: General Surgery | Admitting: Skilled Nursing Facility1

## 2023-11-20 ENCOUNTER — Encounter: Payer: Self-pay | Admitting: Skilled Nursing Facility1

## 2023-11-20 VITALS — Wt 275.0 lb

## 2023-11-20 DIAGNOSIS — E669 Obesity, unspecified: Secondary | ICD-10-CM | POA: Insufficient documentation

## 2023-11-20 MED ORDER — QULIPTA 60 MG PO TABS: 60.0000 mg | ORAL_TABLET | Freq: Every day | ORAL | 5 refills | Status: AC

## 2023-11-20 MED ORDER — RIZATRIPTAN BENZOATE 10 MG PO TABS: 10.0000 mg | ORAL_TABLET | ORAL | 1 refills | Status: AC | PRN

## 2023-11-20 NOTE — Progress Notes (Signed)
 Pre-Operative Nutrition Class:    Patient was seen on 11/20/2023 for Pre-Operative Bariatric Surgery Education at [facility].    Anthropometrics  Start weight at [facility]: 275.2 lbs (date: 07/10/2023)  Height: 67 in Weight today: 275.3 lb BMI: 43.12 kg/m2     Clinical   Pharmacotherapy: History of weight loss medication used: phentermine  (stopped taking, stating she took it twice and never could get on schedule to take it.  Medical hx: obesity, asthma Medications: venlafaxine , cetirizine , montelukast , topamax , vit D, kyleena  (IUD)   Labs: triglycerides 343.0, VLDL 68.6 Notable signs/symptoms: none noted Any previous deficiencies? No   The following the learning objectives were met by the patient during this course: Identify Pre-Op Dietary Goals and will begin 2 weeks pre-operatively Identify appropriate sources of fluids and proteins  State protein recommendations and appropriate sources pre and post-operatively Identify Post-Operative Dietary Goals and will follow for 2 weeks post-operatively Identify appropriate multivitamin and calcium sources Describe the need for physical activity post-operatively and will follow MD recommendations State when to call healthcare provider regarding medication questions or post-operative complications When having a diagnosis of diabetes understanding hypoglycemia symptoms and the inclusion of 1 complex carbohydrate per meal  Handouts given during class include: Pre-Op Bariatric Surgery Diet Handout Protein Shake Handout Post-Op Bariatric Surgery Nutrition Handout BELT Program Information Flyer Support Group Information Flyer [facility] Bariatric Supplements Price List  Follow-Up Plan: Patient will follow-up at [facility] 2 weeks post operatively for diet advancement per MD.

## 2023-11-20 NOTE — Addendum Note (Signed)
 Addended by: Gates Kasal C on: 11/20/2023 04:47 PM   Modules accepted: Orders

## 2023-11-20 NOTE — Addendum Note (Signed)
 Addended by: Gates Kasal C on: 11/20/2023 01:33 PM   Modules accepted: Orders

## 2023-11-21 ENCOUNTER — Telehealth: Payer: Self-pay | Admitting: Pharmacy Technician

## 2023-11-21 ENCOUNTER — Other Ambulatory Visit (HOSPITAL_COMMUNITY): Payer: Self-pay

## 2023-11-21 NOTE — Telephone Encounter (Signed)
 Pharmacy Patient Advocate Encounter  Received notification from [employer] that Prior Authorization for Qulipta 60MG  tablets has been APPROVED from 11/21/2023 to 11/20/2024. Ran test claim, Copay is $0.00. This test claim was processed through [facility]- copay amounts may vary at other pharmacies due to pharmacy/plan contracts, or as the patient moves through the different stages of their insurance plan.   PA #/Case ID/Reference #: [id number]

## 2023-11-21 NOTE — Telephone Encounter (Signed)
 Pharmacy Patient Advocate Encounter   Received notification from [employer] that prior authorization for Qulipta 60MG  tablets is required/requested.   Insurance verification completed.   The patient is insured through [facility] .   Per test claim: PA required; PA submitted to above mentioned insurance via CoverMyMeds Key/confirmation #/EOC [id number] Status is pending

## 2023-11-28 ENCOUNTER — Ambulatory Visit (HOSPITAL_COMMUNITY): Admitting: Licensed Clinical Social Worker

## 2023-11-28 NOTE — Progress Notes (Signed)
 Sent message, via epic in basket, requesting orders in epic from Careers adviser.

## 2023-11-30 ENCOUNTER — Ambulatory Visit: Payer: Self-pay | Admitting: General Surgery

## 2023-11-30 DIAGNOSIS — D696 Thrombocytopenia, unspecified: Secondary | ICD-10-CM

## 2023-12-01 NOTE — Discharge Instructions (Signed)

## 2023-12-04 NOTE — Progress Notes (Signed)
 COVID Vaccine Completed: yes  Date of COVID positive in last 90 days:  PCP - Gates Kasal, MD Cardiologist - n/a  Chest x-ray - 06/19/23 [facility] EKG - 06/19/23 Epic Stress Test - as child for asthma  ECHO - n/a Cardiac Cath - n/a Pacemaker/ICD device last checked: n/a Spinal Cord Stimulator: n/a  Bowel Prep - no solid food after 6pm night before  Sleep Study - n/a CPAP -   Fasting Blood Sugar - n/a Checks Blood Sugar _____ times a day  Last dose of GLP1 agonist-  N/A GLP1 instructions:  Hold 7 days before surgery    Last dose of SGLT-2 inhibitors-  N/A SGLT-2 instructions:  Hold 3 days before surgery    Blood Thinner Instructions:  Last dose: n/a  Time: Aspirin Instructions: Last Dose:  Activity level: Can go up a flight of stairs and perform activities of daily living without stopping and without symptoms of chest pain or shortness of breath.  Anesthesia review:   Patient denies shortness of breath, fever, cough and chest pain at PAT appointment  Patient verbalized understanding of instructions that were given to them at the PAT appointment. Patient was also instructed that they will need to review over the PAT instructions again at home before surgery.

## 2023-12-04 NOTE — Patient Instructions (Signed)
 SURGICAL WAITING ROOM VISITATION  Patients having surgery or a procedure may have no more than 2 support people in the waiting area - these visitors may rotate.    Children under the age of 8 must have an adult with them who is not the patient.  Visitors with respiratory illnesses are discouraged from visiting and should remain at home.  If the patient needs to stay at the hospital during part of their recovery, the visitor guidelines for inpatient rooms apply. Pre-op nurse will coordinate an appropriate time for 1 support person to accompany patient in pre-op.  This support person may not rotate.    Please refer to the [facility] website for the visitor guidelines for Inpatients (after your surgery is over and you are in a regular room).    Your procedure is scheduled on: 12/18/23   Report to [facility] Main Entrance    Report to admitting at 5:15  AM   Call this number if you have problems the morning of surgery [phone]   MORNING OF SURGERY DRINK:   DRINK 1 G2 drink BEFORE YOU LEAVE HOME, DRINK ALL OF THE  G2 DRINK AT ONE TIME.   NO SOLID FOOD AFTER 6:00 PM THE NIGHT BEFORE YOUR SURGERY. YOU MAY DRINK CLEAR FLUIDS. THE G2 DRINK YOU DRINK BEFORE YOU LEAVE HOME WILL BE THE LAST FLUIDS YOU DRINK BEFORE SURGERY.  PAIN IS EXPECTED AFTER SURGERY AND WILL NOT BE COMPLETELY ELIMINATED. AMBULATION AND TYLENOL  WILL HELP REDUCE INCISIONAL AND GAS PAIN. MOVEMENT IS KEY!  YOU ARE EXPECTED TO BE OUT OF BED WITHIN 4 HOURS OF ADMISSION TO YOUR PATIENT ROOM.  SITTING IN THE RECLINER THROUGHOUT THE DAY IS IMPORTANT FOR DRINKING FLUIDS AND MOVING GAS THROUGHOUT THE GI TRACT.  COMPRESSION STOCKINGS SHOULD BE WORN [facility] STAY UNLESS YOU ARE WALKING.   INCENTIVE SPIROMETER SHOULD BE USED EVERY HOUR WHILE AWAKE TO DECREASE POST-OPERATIVE COMPLICATIONS SUCH AS PNEUMONIA.  WHEN DISCHARGED HOME, IT IS IMPORTANT TO CONTINUE TO WALK EVERY HOUR AND USE THE INCENTIVE SPIROMETER  EVERY HOUR.    You may have the following liquids until 4:30 AM DAY OF SURGERY  Water Non-Citrus Juices (without pulp, NO RED-Apple, White grape, White cranberry) Black Coffee (NO MILK/CREAM OR CREAMERS, sugar ok)  Clear Tea (NO MILK/CREAM OR CREAMERS, sugar ok) regular and decaf                             Plain Jell-O (NO RED)                                           Fruit ices (not with fruit pulp, NO RED)                                     Popsicles (NO RED)                                                               Sports drinks like Gatorade (NO RED)    The day of surgery:  Drink ONE (  1) Pre-Surgery G2 at 4:30 AM the morning of surgery. Drink in one sitting. Do not sip.  This drink was given to you during your hospital  pre-op appointment visit. Nothing else to drink after completing the  Pre-Surgery G2.          If you have questions, please contact your surgeon's office.   FOLLOW BOWEL PREP AND ANY ADDITIONAL PRE OP INSTRUCTIONS YOU RECEIVED FROM YOUR SURGEON'S OFFICE!!!     Oral Hygiene is also important to reduce your risk of infection.                                    Remember - BRUSH YOUR TEETH THE MORNING OF SURGERY WITH YOUR REGULAR TOOTHPASTE  DENTURES WILL BE REMOVED PRIOR TO SURGERY PLEASE DO NOT APPLY Poly grip OR ADHESIVES!!!   Stop all vitamins and herbal supplements 7 days before surgery.   Take these medicines the morning of surgery with A SIP OF WATER: Inhalers                              You may not have any metal on your body including hair pins, jewelry, and body piercing             Do not wear make-up, lotions, powders, perfumes, or deodorant  Do not wear nail polish including gel and S&S, artificial/acrylic nails, or any other type of covering on natural nails including finger and toenails. If you have artificial nails, gel coating, etc. that needs to be removed by a nail salon please have this removed prior to surgery or surgery may need  to be canceled/ delayed if the surgeon/ anesthesia feels like they are unable to be safely monitored.   Do not shave  48 hours prior to surgery.    Do not bring valuables to the hospital. [facility] IS NOT             RESPONSIBLE   FOR VALUABLES.   Contacts, glasses, dentures or bridgework may not be worn into surgery.   Bring small overnight bag day of surgery.   DO NOT BRING YOUR HOME MEDICATIONS TO THE HOSPITAL. PHARMACY WILL DISPENSE MEDICATIONS LISTED ON YOUR MEDICATION LIST TO YOU DURING YOUR ADMISSION IN THE HOSPITAL!   Special Instructions: Bring a copy of your healthcare power of attorney and living will documents the day of surgery if you haven't scanned them before.              Please read over the following fact sheets you were given: IF YOU HAVE QUESTIONS ABOUT YOUR PRE-OP INSTRUCTIONS PLEASE CALL [name]   If you received a COVID test during your pre-op visit  it is requested that you wear a mask when out in public, stay away from anyone that may not be feeling well and notify your surgeon if you develop symptoms. If you test positive for Covid or have been in contact with anyone that has tested positive in the last 10 days please notify you surgeon.    [facility] - Preparing for Surgery Before surgery, you can play an important role.  Because skin is not sterile, your skin needs to be as free of germs as possible.  You can reduce the number of germs on your skin by washing with CHG (chlorahexidine gluconate) soap before surgery.  CHG is an  antiseptic cleaner which kills germs and bonds with the skin to continue killing germs even after washing. Please DO NOT use if you have an allergy to CHG or antibacterial soaps.  If your skin becomes reddened/irritated stop using the CHG and inform your nurse when you arrive at Short Stay. Do not shave (including legs and underarms) for at least 48 hours prior to the first CHG shower.  You may shave your face/neck.  Please  follow these instructions carefully:  1.  Shower with CHG Soap the night before surgery and the  morning of surgery.  2.  If you choose to wash your hair, wash your hair first as usual with your normal  shampoo.  3.  After you shampoo, rinse your hair and body thoroughly to remove the shampoo.                             4.  Use CHG as you would any other liquid soap.  You can apply chg directly to the skin and wash.  Gently with a scrungie or clean washcloth.  5.  Apply the CHG Soap to your body ONLY FROM THE NECK DOWN.   Do   not use on face/ open                           Wound or open sores. Avoid contact with eyes, ears mouth and   genitals (private parts).                       Wash face,  Genitals (private parts) with your normal soap.             6.  Wash thoroughly, paying special attention to the area where your    surgery  will be performed.  7.  Thoroughly rinse your body with warm water from the neck down.  8.  DO NOT shower/wash with your normal soap after using and rinsing off the CHG Soap.                9.  Pat yourself dry with a clean towel.            10.  Wear clean pajamas.            11.  Place clean sheets on your bed the night of your first shower and do not  sleep with pets. Day of Surgery : Do not apply any lotions/deodorants the morning of surgery.  Please wear clean clothes to the hospital/surgery center.  FAILURE TO FOLLOW THESE INSTRUCTIONS MAY RESULT IN THE CANCELLATION OF YOUR SURGERY  PATIENT SIGNATURE_________________________________  NURSE SIGNATURE__________________________________  ________________________________________________________________________ WHAT IS A BLOOD TRANSFUSION? Blood Transfusion Information  A transfusion is the replacement of blood or some of its parts. Blood is made up of multiple cells which provide different functions. Red blood cells carry oxygen and are used for blood loss replacement. White blood cells fight against  infection. Platelets control bleeding. Plasma helps clot blood. Other blood products are available for specialized needs, such as hemophilia or other clotting disorders. BEFORE THE TRANSFUSION  Who gives blood for transfusions?  Healthy volunteers who are fully evaluated to make sure their blood is safe. This is blood bank blood. Transfusion therapy is the safest it has ever been in the practice of medicine. Before blood is taken from a donor, a complete history is taken  to make sure that person has no history of diseases nor engages in risky social behavior (examples are intravenous drug use or sexual activity with multiple partners). The donor's travel history is screened to minimize risk of transmitting infections, such as malaria. The donated blood is tested for signs of infectious diseases, such as HIV and hepatitis. The blood is then tested to be sure it is compatible with you in order to minimize the chance of a transfusion reaction. If you or a relative donates blood, this is often done in anticipation of surgery and is not appropriate for emergency situations. It takes many days to process the donated blood. RISKS AND COMPLICATIONS Although transfusion therapy is very safe and saves many lives, the main dangers of transfusion include:  Getting an infectious disease. Developing a transfusion reaction. This is an allergic reaction to something in the blood you were given. Every precaution is taken to prevent this. The decision to have a blood transfusion has been considered carefully by your caregiver before blood is given. Blood is not given unless the benefits outweigh the risks. AFTER THE TRANSFUSION Right after receiving a blood transfusion, you will usually feel much better and more energetic. This is especially true if your red blood cells have gotten low (anemic). The transfusion raises the level of the red blood cells which carry oxygen, and this usually causes an energy increase. The  nurse administering the transfusion will monitor you carefully for complications. HOME CARE INSTRUCTIONS  No special instructions are needed after a transfusion. You may find your energy is better. Speak with your caregiver about any limitations on activity for underlying diseases you may have. SEEK MEDICAL CARE IF:  Your condition is not improving after your transfusion. You develop redness or irritation at the intravenous (IV) site. SEEK IMMEDIATE MEDICAL CARE IF:  Any of the following symptoms occur over the next 12 hours: Shaking chills. You have a temperature by mouth above 102 F (38.9 C), not controlled by medicine. Chest, back, or muscle pain. People around you feel you are not acting correctly or are confused. Shortness of breath or difficulty breathing. Dizziness and fainting. You get a rash or develop hives. You have a decrease in urine output. Your urine turns a dark color or changes to pink, red, or brown. Any of the following symptoms occur over the next 10 days: You have a temperature by mouth above 102 F (38.9 C), not controlled by medicine. Shortness of breath. Weakness after normal activity. The white part of the eye turns yellow (jaundice). You have a decrease in the amount of urine or are urinating less often. Your urine turns a dark color or changes to pink, red, or brown. Document Released: 06/03/2000 Document Revised: 08/29/2011 Document Reviewed: 01/21/2008 [facility] Patient Information 2014 [place], [place].  _______________________________________________________________________

## 2023-12-05 ENCOUNTER — Encounter (HOSPITAL_COMMUNITY)
Admission: RE | Admit: 2023-12-05 | Discharge: 2023-12-05 | Disposition: A | Discharge status: Home / Self Care | Source: Ambulatory Visit | Attending: General Surgery | Admitting: General Surgery

## 2023-12-05 ENCOUNTER — Encounter (HOSPITAL_COMMUNITY): Payer: Self-pay

## 2023-12-05 ENCOUNTER — Other Ambulatory Visit: Payer: Self-pay

## 2023-12-05 DIAGNOSIS — D696 Thrombocytopenia, unspecified: Secondary | ICD-10-CM | POA: Diagnosis not present

## 2023-12-05 DIAGNOSIS — Z01812 Encounter for preprocedural laboratory examination: Secondary | ICD-10-CM | POA: Insufficient documentation

## 2023-12-05 HISTORY — DX: Headache, unspecified: R51.9

## 2023-12-05 LAB — CBC WITH DIFFERENTIAL/PLATELET
Abs Immature Granulocytes: 0.03 10*3/uL (ref 0.00–0.07)
Basophils Absolute: 0 10*3/uL (ref 0.0–0.1)
Basophils Relative: 1 %
Eosinophils Absolute: 0 10*3/uL (ref 0.0–0.5)
Eosinophils Relative: 0 %
HCT: 43 % (ref 36.0–46.0)
Hemoglobin: 13.8 g/dL (ref 12.0–15.0)
Immature Granulocytes: 1 %
Lymphocytes Relative: 22 %
Lymphs Abs: 1.4 10*3/uL (ref 0.7–4.0)
MCH: 28.3 pg (ref 26.0–34.0)
MCHC: 32.1 g/dL (ref 30.0–36.0)
MCV: 88.1 fL (ref 80.0–100.0)
Monocytes Absolute: 0.3 10*3/uL (ref 0.1–1.0)
Monocytes Relative: 5 %
Neutro Abs: 4.5 10*3/uL (ref 1.7–7.7)
Neutrophils Relative %: 71 %
Platelets: 288 10*3/uL (ref 150–400)
RBC: 4.88 MIL/uL (ref 3.87–5.11)
RDW: 12.9 % (ref 11.5–15.5)
WBC: 6.3 10*3/uL (ref 4.0–10.5)
nRBC: 0 % (ref 0.0–0.2)

## 2023-12-05 LAB — COMPREHENSIVE METABOLIC PANEL WITH GFR
ALT: 20 U/L (ref 0–44)
AST: 20 U/L (ref 15–41)
Albumin: 4 g/dL (ref 3.5–5.0)
Alkaline Phosphatase: 65 U/L (ref 38–126)
Anion gap: 8 (ref 5–15)
BUN: 12 mg/dL (ref 6–20)
CO2: 23 mmol/L (ref 22–32)
Calcium: 9 mg/dL (ref 8.9–10.3)
Chloride: 110 mmol/L (ref 98–111)
Creatinine, Ser: 0.84 mg/dL (ref 0.44–1.00)
GFR, Estimated: >60 mL/min (ref >60)
Glucose, Bld: 116 mg/dL — ABNORMAL HIGH (ref 70–99)
Potassium: 3.9 mmol/L (ref 3.5–5.1)
Sodium: 141 mmol/L (ref 135–145)
Total Bilirubin: 0.6 mg/dL (ref 0.0–1.2)
Total Protein: 6.8 g/dL (ref 6.5–8.1)

## 2023-12-05 LAB — TYPE AND SCREEN
ABO/RH(D): AB NEG
Antibody Screen: NEGATIVE

## 2023-12-06 ENCOUNTER — Encounter: Payer: Self-pay | Admitting: Family Medicine

## 2023-12-06 ENCOUNTER — Other Ambulatory Visit: Payer: Self-pay | Admitting: Family Medicine

## 2023-12-06 DIAGNOSIS — G43109 Migraine with aura, not intractable, without status migrainosus: Secondary | ICD-10-CM

## 2023-12-14 ENCOUNTER — Ambulatory Visit (HOSPITAL_COMMUNITY): Admitting: Licensed Clinical Social Worker

## 2023-12-15 NOTE — Anesthesia Preprocedure Evaluation (Signed)
 Anesthesia Evaluation  Patient identified by MRN, date of birth, ID band Patient awake    Reviewed: Allergy & Precautions, H&P , NPO status , Patient's Chart, lab work & pertinent test results  Airway Mallampati: II  TM Distance: >3 FB Neck ROM: Full    Dental no notable dental hx. (+) Teeth Intact, Dental Advisory Given   Pulmonary asthma    Pulmonary exam normal breath sounds clear to auscultation       Cardiovascular Exercise Tolerance: Good negative cardio ROS  Rhythm:Regular Rate:Normal     Neuro/Psych  Headaches  Anxiety        GI/Hepatic negative GI ROS, Neg liver ROS,,,  Endo/Other    Class 3 obesity  Renal/GU negative Renal ROS  negative genitourinary   Musculoskeletal   Abdominal   Peds  Hematology negative hematology ROS (+)   Anesthesia Other Findings   Reproductive/Obstetrics negative OB ROS                             Anesthesia Physical Anesthesia Plan  ASA: 3  Anesthesia Plan: General   Post-op Pain Management: Tylenol  PO (pre-op)*   Induction: Intravenous  PONV Risk Score and Plan: 4 or greater and Ondansetron , Dexamethasone , Midazolam and Scopolamine patch - Pre-op  Airway Management Planned: Oral ETT  Additional Equipment:   Intra-op Plan:   Post-operative Plan: Extubation in OR  Informed Consent: I have reviewed the patients History and Physical, chart, labs and discussed the procedure including the risks, benefits and alternatives for the proposed anesthesia with the patient or authorized representative who has indicated his/her understanding and acceptance.     Dental advisory given  Plan Discussed with: CRNA  Anesthesia Plan Comments:        Anesthesia Quick Evaluation

## 2023-12-18 ENCOUNTER — Telehealth (HOSPITAL_COMMUNITY): Payer: Self-pay | Admitting: Pharmacy Technician

## 2023-12-18 ENCOUNTER — Ambulatory Visit (HOSPITAL_COMMUNITY): Payer: Self-pay | Admitting: Physician Assistant

## 2023-12-18 ENCOUNTER — Encounter (HOSPITAL_COMMUNITY): Payer: Self-pay | Admitting: General Surgery

## 2023-12-18 ENCOUNTER — Encounter (HOSPITAL_COMMUNITY)
Admission: RE | Disposition: A | Discharge status: Home / Self Care | Payer: Self-pay | Source: Home / Self Care | Attending: General Surgery

## 2023-12-18 ENCOUNTER — Other Ambulatory Visit (HOSPITAL_COMMUNITY): Payer: Self-pay

## 2023-12-18 ENCOUNTER — Other Ambulatory Visit: Payer: Self-pay

## 2023-12-18 ENCOUNTER — Ambulatory Visit (HOSPITAL_BASED_OUTPATIENT_CLINIC_OR_DEPARTMENT_OTHER): Admitting: Anesthesiology

## 2023-12-18 ENCOUNTER — Observation Stay (HOSPITAL_COMMUNITY)
Admission: RE | Admit: 2023-12-18 | Discharge: 2023-12-20 | Disposition: A | Discharge status: Home / Self Care | Attending: General Surgery | Admitting: General Surgery

## 2023-12-18 DIAGNOSIS — Z86711 Personal history of pulmonary embolism: Secondary | ICD-10-CM | POA: Insufficient documentation

## 2023-12-18 DIAGNOSIS — Z8616 Personal history of COVID-19: Secondary | ICD-10-CM | POA: Insufficient documentation

## 2023-12-18 DIAGNOSIS — E781 Pure hyperglyceridemia: Secondary | ICD-10-CM | POA: Insufficient documentation

## 2023-12-18 DIAGNOSIS — F419 Anxiety disorder, unspecified: Secondary | ICD-10-CM

## 2023-12-18 DIAGNOSIS — E611 Iron deficiency: Secondary | ICD-10-CM | POA: Diagnosis not present

## 2023-12-18 DIAGNOSIS — J4520 Mild intermittent asthma, uncomplicated: Secondary | ICD-10-CM | POA: Diagnosis not present

## 2023-12-18 DIAGNOSIS — J45909 Unspecified asthma, uncomplicated: Secondary | ICD-10-CM

## 2023-12-18 DIAGNOSIS — Z6841 Body Mass Index (BMI) 40.0 and over, adult: Secondary | ICD-10-CM

## 2023-12-18 DIAGNOSIS — Z9884 Bariatric surgery status: Principal | ICD-10-CM

## 2023-12-18 DIAGNOSIS — G43809 Other migraine, not intractable, without status migrainosus: Secondary | ICD-10-CM | POA: Diagnosis not present

## 2023-12-18 DIAGNOSIS — Z86718 Personal history of other venous thrombosis and embolism: Secondary | ICD-10-CM | POA: Diagnosis not present

## 2023-12-18 DIAGNOSIS — Z79899 Other long term (current) drug therapy: Secondary | ICD-10-CM | POA: Diagnosis not present

## 2023-12-18 HISTORY — PX: LAPAROSCOPIC GASTRIC SLEEVE RESECTION: SHX5895

## 2023-12-18 HISTORY — PX: UPPER GI ENDOSCOPY: SHX6162

## 2023-12-18 LAB — CREATININE, SERUM
Creatinine, Ser: 0.74 mg/dL (ref 0.44–1.00)
GFR, Estimated: >60 mL/min (ref >60)

## 2023-12-18 LAB — URINALYSIS, W/ REFLEX TO CULTURE (INFECTION SUSPECTED)
Bilirubin Urine: NEGATIVE
Glucose, UA: NEGATIVE mg/dL
Hgb urine dipstick: NEGATIVE
Ketones, ur: NEGATIVE mg/dL
Nitrite: NEGATIVE
Protein, ur: NEGATIVE mg/dL
Specific Gravity, Urine: 1.024 (ref 1.005–1.030)
pH: 5 (ref 5.0–8.0)

## 2023-12-18 LAB — CBC
HCT: 42 % (ref 36.0–46.0)
Hemoglobin: 13.5 g/dL (ref 12.0–15.0)
MCH: 28.5 pg (ref 26.0–34.0)
MCHC: 32.1 g/dL (ref 30.0–36.0)
MCV: 88.8 fL (ref 80.0–100.0)
Platelets: 243 10*3/uL (ref 150–400)
RBC: 4.73 MIL/uL (ref 3.87–5.11)
RDW: 12.7 % (ref 11.5–15.5)
WBC: 8.8 10*3/uL (ref 4.0–10.5)
nRBC: 0 % (ref 0.0–0.2)

## 2023-12-18 LAB — ABO/RH: ABO/RH(D): AB NEG

## 2023-12-18 LAB — HEMOGLOBIN AND HEMATOCRIT, BLOOD
HCT: 40.8 % (ref 36.0–46.0)
Hemoglobin: 13.6 g/dL (ref 12.0–15.0)

## 2023-12-18 LAB — POCT PREGNANCY, URINE: Preg Test, Ur: NEGATIVE

## 2023-12-18 SURGERY — GASTRECTOMY, SLEEVE, LAPAROSCOPIC
Anesthesia: General

## 2023-12-18 MED ORDER — LIDOCAINE HCL (CARDIAC) PF 100 MG/5ML IV SOSY
PREFILLED_SYRINGE | INTRAVENOUS | Status: DC | PRN
  Administered 2023-12-18: 60 mg via INTRAVENOUS

## 2023-12-18 MED ORDER — OXYCODONE HCL 5 MG/5ML PO SOLN
5.0000 mg | Freq: Four times a day (QID) | ORAL | Status: DC | PRN
  Administered 2023-12-18 – 2023-12-19 (×5): 5 mg via ORAL
  Filled 2023-12-18 (×5): qty 5

## 2023-12-18 MED ORDER — PROPOFOL 10 MG/ML IV BOLUS
INTRAVENOUS | Status: AC
  Filled 2023-12-18: qty 20

## 2023-12-18 MED ORDER — ALBUTEROL SULFATE HFA 108 (90 BASE) MCG/ACT IN AERS: 2.0000 | INHALATION_SPRAY | RESPIRATORY_TRACT | Status: DC | PRN

## 2023-12-18 MED ORDER — SODIUM CHLORIDE 0.9 % IV SOLN: 12.5000 mg | Freq: Four times a day (QID) | INTRAVENOUS | Status: DC | PRN

## 2023-12-18 MED ORDER — STERILE WATER FOR IRRIGATION IR SOLN
Status: DC | PRN
  Administered 2023-12-18: 1000 mL

## 2023-12-18 MED ORDER — ROCURONIUM BROMIDE 10 MG/ML (PF) SYRINGE
PREFILLED_SYRINGE | INTRAVENOUS | Status: AC
  Filled 2023-12-18: qty 10

## 2023-12-18 MED ORDER — SIMETHICONE 80 MG PO CHEW
80.0000 mg | CHEWABLE_TABLET | Freq: Four times a day (QID) | ORAL | Status: DC | PRN
  Administered 2023-12-19: 80 mg via ORAL
  Filled 2023-12-18: qty 1

## 2023-12-18 MED ORDER — ENSURE MAX PROTEIN PO LIQD
2.0000 [oz_av] | ORAL | Status: DC
  Administered 2023-12-19: 2 [oz_av] via ORAL

## 2023-12-18 MED ORDER — SUCCINYLCHOLINE CHLORIDE 200 MG/10ML IV SOSY
PREFILLED_SYRINGE | INTRAVENOUS | Status: AC
  Filled 2023-12-18: qty 10

## 2023-12-18 MED ORDER — FENTANYL CITRATE (PF) 100 MCG/2ML IJ SOLN
INTRAMUSCULAR | Status: AC
  Filled 2023-12-18: qty 2

## 2023-12-18 MED ORDER — KETAMINE HCL 50 MG/5ML IJ SOSY
PREFILLED_SYRINGE | INTRAMUSCULAR | Status: AC
  Filled 2023-12-18: qty 5

## 2023-12-18 MED ORDER — CHLORHEXIDINE GLUCONATE 0.12 % MT SOLN
15.0000 mL | Freq: Once | OROMUCOSAL | Status: AC
  Administered 2023-12-18: 15 mL via OROMUCOSAL

## 2023-12-18 MED ORDER — SCOPOLAMINE 1 MG/3DAYS TD PT72
1.0000 | MEDICATED_PATCH | TRANSDERMAL | Status: DC
  Administered 2023-12-18: 1.5 mg via TRANSDERMAL
  Filled 2023-12-18: qty 1

## 2023-12-18 MED ORDER — CHLORHEXIDINE GLUCONATE 4 % EX SOLN: Freq: Once | CUTANEOUS | Status: DC

## 2023-12-18 MED ORDER — ATOGEPANT 60 MG PO TABS
60.0000 mg | ORAL_TABLET | Freq: Every day | ORAL | Status: DC
  Administered 2023-12-19 – 2023-12-20 (×2): 60 mg via ORAL
  Filled 2023-12-18 (×3): qty 1

## 2023-12-18 MED ORDER — APREPITANT 40 MG PO CAPS
40.0000 mg | ORAL_CAPSULE | ORAL | Status: AC
  Administered 2023-12-18: 40 mg via ORAL
  Filled 2023-12-18: qty 1

## 2023-12-18 MED ORDER — SUGAMMADEX SODIUM 200 MG/2ML IV SOLN
INTRAVENOUS | Status: DC | PRN
  Administered 2023-12-18: 200 mg via INTRAVENOUS

## 2023-12-18 MED ORDER — ONDANSETRON HCL 4 MG/2ML IJ SOLN
INTRAMUSCULAR | Status: DC | PRN
  Administered 2023-12-18: 4 mg via INTRAVENOUS

## 2023-12-18 MED ORDER — SODIUM CHLORIDE 0.9 % IV SOLN
2.0000 g | INTRAVENOUS | Status: AC
  Administered 2023-12-18: 2 g via INTRAVENOUS
  Filled 2023-12-18: qty 2

## 2023-12-18 MED ORDER — FENTANYL CITRATE (PF) 100 MCG/2ML IJ SOLN
INTRAMUSCULAR | Status: DC | PRN
  Administered 2023-12-18 (×6): 50 ug via INTRAVENOUS

## 2023-12-18 MED ORDER — PROPOFOL 1000 MG/100ML IV EMUL
INTRAVENOUS | Status: AC
  Filled 2023-12-18: qty 100

## 2023-12-18 MED ORDER — GABAPENTIN 100 MG PO CAPS
100.0000 mg | ORAL_CAPSULE | Freq: Two times a day (BID) | ORAL | Status: DC
  Administered 2023-12-18 – 2023-12-20 (×5): 100 mg via ORAL
  Filled 2023-12-18 (×5): qty 1

## 2023-12-18 MED ORDER — GABAPENTIN 100 MG PO CAPS
100.0000 mg | ORAL_CAPSULE | ORAL | Status: AC
  Administered 2023-12-18: 100 mg via ORAL
  Filled 2023-12-18: qty 1

## 2023-12-18 MED ORDER — ONDANSETRON HCL 4 MG/2ML IJ SOLN: 4.0000 mg | Freq: Four times a day (QID) | INTRAMUSCULAR | Status: DC | PRN

## 2023-12-18 MED ORDER — FLUCONAZOLE 150 MG PO TABS
150.0000 mg | ORAL_TABLET | Freq: Once | ORAL | Status: AC
  Administered 2023-12-18: 150 mg via ORAL
  Filled 2023-12-18: qty 1

## 2023-12-18 MED ORDER — HYDROMORPHONE HCL 1 MG/ML IJ SOLN
0.2500 mg | INTRAMUSCULAR | Status: DC | PRN
  Administered 2023-12-18 (×2): 0.5 mg via INTRAVENOUS

## 2023-12-18 MED ORDER — PANTOPRAZOLE SODIUM 40 MG IV SOLR
40.0000 mg | Freq: Every day | INTRAVENOUS | Status: DC
  Administered 2023-12-18 – 2023-12-19 (×2): 40 mg via INTRAVENOUS
  Filled 2023-12-18 (×2): qty 10

## 2023-12-18 MED ORDER — LACTATED RINGERS IV SOLN: INTRAVENOUS | Status: DC

## 2023-12-18 MED ORDER — ONDANSETRON HCL 4 MG/2ML IJ SOLN
INTRAMUSCULAR | Status: AC
  Filled 2023-12-18: qty 2

## 2023-12-18 MED ORDER — HEPARIN SODIUM (PORCINE) 5000 UNIT/ML IJ SOLN
5000.0000 [IU] | INTRAMUSCULAR | Status: AC
  Administered 2023-12-18: 5000 [IU] via SUBCUTANEOUS
  Filled 2023-12-18: qty 1

## 2023-12-18 MED ORDER — DEXAMETHASONE SODIUM PHOSPHATE 10 MG/ML IJ SOLN
INTRAMUSCULAR | Status: AC
  Filled 2023-12-18: qty 1

## 2023-12-18 MED ORDER — MIDAZOLAM HCL 5 MG/5ML IJ SOLN
INTRAMUSCULAR | Status: DC | PRN
  Administered 2023-12-18: 2 mg via INTRAVENOUS

## 2023-12-18 MED ORDER — DEXMEDETOMIDINE HCL IN NACL 80 MCG/20ML IV SOLN
INTRAVENOUS | Status: DC | PRN
  Administered 2023-12-18: 8 ug via INTRAVENOUS
  Administered 2023-12-18: 4 ug via INTRAVENOUS
  Administered 2023-12-18: 8 ug via INTRAVENOUS

## 2023-12-18 MED ORDER — BUPIVACAINE-EPINEPHRINE (PF) 0.25% -1:200000 IJ SOLN
INTRAMUSCULAR | Status: AC
  Filled 2023-12-18: qty 30

## 2023-12-18 MED ORDER — DEXMEDETOMIDINE HCL IN NACL 80 MCG/20ML IV SOLN
INTRAVENOUS | Status: AC
  Filled 2023-12-18: qty 20

## 2023-12-18 MED ORDER — HYDROMORPHONE HCL 1 MG/ML IJ SOLN
INTRAMUSCULAR | Status: AC
  Filled 2023-12-18: qty 1

## 2023-12-18 MED ORDER — ROCURONIUM BROMIDE 100 MG/10ML IV SOLN
INTRAVENOUS | Status: DC | PRN
  Administered 2023-12-18: 70 mg via INTRAVENOUS

## 2023-12-18 MED ORDER — ACETAMINOPHEN 160 MG/5ML PO SOLN: 1000.0000 mg | Freq: Three times a day (TID) | ORAL | Status: DC

## 2023-12-18 MED ORDER — TOPIRAMATE 25 MG PO TABS: 50.0000 mg | ORAL_TABLET | Freq: Every day | ORAL | Status: DC

## 2023-12-18 MED ORDER — DROPERIDOL 2.5 MG/ML IJ SOLN
0.6250 mg | Freq: Once | INTRAMUSCULAR | Status: AC
  Administered 2023-12-18: 0.625 mg via INTRAVENOUS

## 2023-12-18 MED ORDER — ENOXAPARIN (LOVENOX) PATIENT EDUCATION KIT
PACK | Freq: Once | Status: AC
  Filled 2023-12-18: qty 1

## 2023-12-18 MED ORDER — ORAL CARE MOUTH RINSE: 15.0000 mL | Freq: Once | OROMUCOSAL | Status: AC

## 2023-12-18 MED ORDER — PROPOFOL 500 MG/50ML IV EMUL
INTRAVENOUS | Status: DC | PRN
  Administered 2023-12-18: 175 ug/kg/min via INTRAVENOUS

## 2023-12-18 MED ORDER — ACETAMINOPHEN 500 MG PO TABS
1000.0000 mg | ORAL_TABLET | ORAL | Status: AC
  Administered 2023-12-18: 1000 mg via ORAL
  Filled 2023-12-18: qty 2

## 2023-12-18 MED ORDER — MORPHINE SULFATE (PF) 2 MG/ML IV SOLN
1.0000 mg | INTRAVENOUS | Status: DC | PRN
  Administered 2023-12-19: 2 mg via INTRAVENOUS
  Filled 2023-12-18: qty 1

## 2023-12-18 MED ORDER — BUPIVACAINE-EPINEPHRINE 0.25% -1:200000 IJ SOLN
INTRAMUSCULAR | Status: DC | PRN
  Administered 2023-12-18: 60 mL

## 2023-12-18 MED ORDER — ALBUTEROL SULFATE (2.5 MG/3ML) 0.083% IN NEBU: 2.5000 mg | INHALATION_SOLUTION | RESPIRATORY_TRACT | Status: DC | PRN

## 2023-12-18 MED ORDER — TOPIRAMATE 25 MG PO TABS
50.0000 mg | ORAL_TABLET | Freq: Every day | ORAL | Status: DC
  Administered 2023-12-18 – 2023-12-19 (×2): 50 mg via ORAL
  Filled 2023-12-18 (×2): qty 2

## 2023-12-18 MED ORDER — MIDAZOLAM HCL 2 MG/2ML IJ SOLN
INTRAMUSCULAR | Status: AC
  Filled 2023-12-18: qty 2

## 2023-12-18 MED ORDER — PHENYLEPHRINE 80 MCG/ML (10ML) SYRINGE FOR IV PUSH (FOR BLOOD PRESSURE SUPPORT)
PREFILLED_SYRINGE | INTRAVENOUS | Status: DC | PRN
  Administered 2023-12-18: 80 ug via INTRAVENOUS
  Administered 2023-12-18: 160 ug via INTRAVENOUS

## 2023-12-18 MED ORDER — LIDOCAINE HCL (PF) 2 % IJ SOLN
INTRAMUSCULAR | Status: AC
  Filled 2023-12-18: qty 5

## 2023-12-18 MED ORDER — PROPOFOL 10 MG/ML IV BOLUS
INTRAVENOUS | Status: DC | PRN
  Administered 2023-12-18: 200 mg via INTRAVENOUS

## 2023-12-18 MED ORDER — DEXAMETHASONE SODIUM PHOSPHATE 10 MG/ML IJ SOLN
4.0000 mg | INTRAMUSCULAR | Status: AC
  Administered 2023-12-18: 8 mg via INTRAVENOUS

## 2023-12-18 MED ORDER — KCL IN DEXTROSE-NACL 20-5-0.45 MEQ/L-%-% IV SOLN
INTRAVENOUS | Status: AC
  Filled 2023-12-18 (×3): qty 1000

## 2023-12-18 MED ORDER — DROPERIDOL 2.5 MG/ML IJ SOLN
INTRAMUSCULAR | Status: AC
  Filled 2023-12-18: qty 2

## 2023-12-18 MED ORDER — ACETAMINOPHEN 500 MG PO TABS
1000.0000 mg | ORAL_TABLET | Freq: Three times a day (TID) | ORAL | Status: DC
  Administered 2023-12-18 – 2023-12-20 (×7): 1000 mg via ORAL
  Filled 2023-12-18 (×7): qty 2

## 2023-12-18 MED ORDER — LACTATED RINGERS IR SOLN
Status: DC | PRN
  Administered 2023-12-18: 1000 mL

## 2023-12-18 MED ORDER — HEPARIN SODIUM (PORCINE) 5000 UNIT/ML IJ SOLN
5000.0000 [IU] | Freq: Three times a day (TID) | INTRAMUSCULAR | Status: DC
  Administered 2023-12-18 – 2023-12-20 (×6): 5000 [IU] via SUBCUTANEOUS
  Filled 2023-12-18 (×6): qty 1

## 2023-12-18 MED ORDER — 0.9 % SODIUM CHLORIDE (POUR BTL) OPTIME
TOPICAL | Status: DC | PRN
  Administered 2023-12-18: 1000 mL

## 2023-12-18 SURGICAL SUPPLY — 63 items
APPLICATOR COTTON TIP 6 STRL (MISCELLANEOUS) IMPLANT
BAG COUNTER SPONGE SURGICOUNT (BAG) IMPLANT
BLADE SURG SZ11 CARB STEEL (BLADE) ×2 IMPLANT
CABLE HIGH FREQUENCY MONO STRZ (ELECTRODE) IMPLANT
CHLORAPREP W/TINT 26 (MISCELLANEOUS) ×4 IMPLANT
CLIP APPLIE ROT 10 11.4 M/L (STAPLE) IMPLANT
CLIP APPLIE ROT 13.4 12 LRG (CLIP) IMPLANT
COVER SURGICAL LIGHT HANDLE (MISCELLANEOUS) ×2 IMPLANT
DEVICE SUT QUICK LOAD TK 5 (SUTURE) IMPLANT
DEVICE SUT TI-KNOT TK 5X26 (SUTURE) IMPLANT
DEVICE SUTURE ENDOST 10MM (ENDOMECHANICALS) IMPLANT
DISSECTOR BLUNT TIP ENDO 5MM (MISCELLANEOUS) IMPLANT
DRAPE UTILITY XL STRL (DRAPES) ×4 IMPLANT
DRSG TEGADERM 2-3/8X2-3/4 SM (GAUZE/BANDAGES/DRESSINGS) ×12 IMPLANT
ELECT REM PT RETURN 15FT ADLT (MISCELLANEOUS) ×2 IMPLANT
ELECTRODE L-HOOK LAP 45CM DISP (ELECTROSURGICAL) IMPLANT
GAUZE SPONGE 2X2 8PLY STRL LF (GAUZE/BANDAGES/DRESSINGS) IMPLANT
GAUZE SPONGE 4X4 12PLY STRL (GAUZE/BANDAGES/DRESSINGS) IMPLANT
GLOVE BIO SURGEON STRL SZ7.5 (GLOVE) ×2 IMPLANT
GLOVE INDICATOR 8.0 STRL GRN (GLOVE) ×2 IMPLANT
GOWN STRL REUS W/ TWL XL LVL3 (GOWN DISPOSABLE) ×2 IMPLANT
GRASPER SUT TROCAR 14GX15 (MISCELLANEOUS) ×2 IMPLANT
IRRIGATION SUCT STRKRFLW 2 WTP (MISCELLANEOUS) ×2 IMPLANT
KIT BASIN OR (CUSTOM PROCEDURE TRAY) ×2 IMPLANT
KIT TURNOVER KIT A (KITS) ×2 IMPLANT
MARKER SKIN DUAL TIP RULER LAB (MISCELLANEOUS) ×2 IMPLANT
MAT PREVALON FULL STRYKER (MISCELLANEOUS) ×2 IMPLANT
NDL SPNL 22GX3.5 QUINCKE BK (NEEDLE) ×2 IMPLANT
NEEDLE SPNL 22GX3.5 QUINCKE BK (NEEDLE) ×1 IMPLANT
PACK UNIVERSAL I (CUSTOM PROCEDURE TRAY) ×2 IMPLANT
RELOAD STAPLE 60 3.6 BLU REG (STAPLE) ×2 IMPLANT
RELOAD STAPLE 60 3.8 GOLD REG (STAPLE) IMPLANT
RELOAD STAPLE 60 4.1 GRN THCK (STAPLE) ×2 IMPLANT
RELOAD STAPLE 60 BLK VRY/THCK (STAPLE) IMPLANT
RELOAD STAPLER 60MM BLK (STAPLE) IMPLANT
RELOAD STAPLER BLUE 60MM (STAPLE) ×4 IMPLANT
RELOAD STAPLER GOLD 60MM (STAPLE) ×1 IMPLANT
RELOAD STAPLER GREEN 60MM (STAPLE) IMPLANT
SCISSORS LAP 5X45 EPIX DISP (ENDOMECHANICALS) IMPLANT
SEALANT SURGICAL APPL DUAL CAN (MISCELLANEOUS) IMPLANT
SET TUBE SMOKE EVAC HIGH FLOW (TUBING) ×2 IMPLANT
SHEARS HARMONIC 45 ACE (MISCELLANEOUS) ×2 IMPLANT
SLEEVE ADV FIXATION 5X100MM (TROCAR) ×4 IMPLANT
SLEEVE GASTRECTOMY 40FR VISIGI (MISCELLANEOUS) ×2 IMPLANT
SOLUTION ANTFG W/FOAM PAD STRL (MISCELLANEOUS) ×2 IMPLANT
SPIKE FLUID TRANSFER (MISCELLANEOUS) ×2 IMPLANT
STAPLE LINE REINFORCEMENT LAP (STAPLE) IMPLANT
STAPLER ECHELON BIOABSB 60 FLE (MISCELLANEOUS) IMPLANT
STAPLER ECHELON LONG 3000 60 (ENDOMECHANICALS) IMPLANT
STAPLER ECHELON LONG 60 440 (INSTRUMENTS) IMPLANT
STRIP CLOSURE SKIN 1/2X4 (GAUZE/BANDAGES/DRESSINGS) ×2 IMPLANT
SUT MNCRL AB 4-0 PS2 18 (SUTURE) ×2 IMPLANT
SUT SURGIDAC NAB ES-9 0 48 120 (SUTURE) IMPLANT
SUT VICRYL 0 TIES 12 18 (SUTURE) ×2 IMPLANT
SYR 20ML LL LF (SYRINGE) ×2 IMPLANT
SYR 50ML LL SCALE MARK (SYRINGE) ×2 IMPLANT
SYSTEM KII OPTICAL ACCESS 15MM (TROCAR) ×2 IMPLANT
TOWEL OR 17X26 10 PK STRL BLUE (TOWEL DISPOSABLE) ×2 IMPLANT
TROCAR ADV FIXATION 5X100MM (TROCAR) ×2 IMPLANT
TROCAR XCEL NON-BLD 5MMX100MML (ENDOMECHANICALS) ×2 IMPLANT
TROCAR Z-THREAD OPTICAL 5X100M (TROCAR) IMPLANT
TUBING CONNECTING 10 (TUBING) ×4 IMPLANT
TUBING ENDO SMARTCAP (MISCELLANEOUS) ×2 IMPLANT

## 2023-12-18 NOTE — Op Note (Signed)
 Preoperative diagnosis: laparoscopic sleeve gastrectomy  Postoperative diagnosis: Same   Procedure: Upper endoscopy   Surgeon: Mitzie DELENA Freund, M.D.  Anesthesia: Gen.   Description of procedure: The endoscope was placed in the mouth and oropharynx and under endoscopic vision it was advanced to the esophagogastric junction which was identified at 41cm from the teeth.  The sleeve was tensely insufflated while the upper abdomen was flooded with irrigation to perform a leak test, which was negative. No bubbles were seen.  The staple line was hemostatic and the lumen was evenly tubular without undue narrowing, angulation or twisting specifically at the incisura angularis. The lumen was decompressed and the scope was withdrawn without difficulty.    Mitzie DELENA Freund, M.D. General, Bariatric, & Minimally Invasive Surgery [facility]

## 2023-12-18 NOTE — Interval H&P Note (Signed)
 History and Physical Interval Note:  12/18/2023 7:24 AM  Kayla Hayes  has presented today for surgery, with the diagnosis of MORBID OBESITY.  The various methods of treatment have been discussed with the patient and family. After consideration of risks, benefits and other options for treatment, the patient has consented to  Procedure(s): GASTRECTOMY, SLEEVE, LAPAROSCOPIC (N/A) ENDOSCOPY, UPPER GI TRACT (N/A) as a surgical intervention.  The patient's history has been reviewed, patient examined, no change in status, stable for surgery.  I have reviewed the patient's chart and labs.  Questions were answered to the patient's satisfaction.     [name]

## 2023-12-18 NOTE — H&P (Signed)
 PROVIDER: Tymothy Cass [name], MD  MRN: [id number] DOB: [birth year] DATE OF ENCOUNTER: 12/08/2023 Subjective  Chief Complaint: Pre-op Exam (LSG and upper endo/)   History of Present Illness Kayla Hayes is a 23 year old female who presents for long-term follow-up regarding weight management and consideration of sleeve gastrectomy.  She is preparing for a sleeve gastrectomy as a treatment option for severe obesity and related comorbidities. She has attended a preoperative education class and has questions about the procedure, including concerns about birth control efficacy post-surgery and the surgical process itself. She experiences 'white coat syndrome,' leading to elevated blood pressure readings in medical settings, though her home blood pressure monitoring is generally within normal ranges except during stressful events like migraines. She is preparing for the preoperative liquid diet and is exploring different protein supplements to avoid monotony.  She has a history of migraines that began after a 'rep' event. Initially treated with Topamax , she experienced a breakthrough migraine in May, lasting about a week and significantly impacting her ability to eat and function. Hospital treatment with a 'migraine cocktail' was required, and she was subsequently started on a new medication, which she has been on for a week and reports it seems to be helping. She experiences nausea associated with the pain but no gastrointestinal symptoms like diarrhea during migraines.  She expresses some anxiety about the day of the procedure, particularly regarding the preoperative process and potential postoperative complications. She has inquired about the surgical procedure details, expressing anxiety about the process, particularly regarding being awake during positioning in the operating room.  Review of Systems: A complete review of systems was obtained from the patient. I have reviewed this information and  discussed as appropriate with the patient. See HPI as well for other ROS.  ROS  Medical History: Past Medical History: Diagnosis Date Anxiety Asthma, unspecified asthma severity, unspecified whether complicated, unspecified whether persistent (HHS-HCC) DVT (deep venous thrombosis) (CMS/HHS-HCC) Lupus anticoagulant positive Migraine without aura Pulmonary embolism (CMS/HHS-HCC) setting of COVID-19 Pulmonary hypertension (CMS/HHS-HCC)  Patient Active Problem List Diagnosis History of pulmonary embolism Migraine  Past Surgical History: Procedure Laterality Date COLONOSCOPY 2022   No Known Allergies  Current Outpatient Medications on File Prior to Visit Medication Sig Dispense Refill albuterol  MDI, PROVENTIL , VENTOLIN , PROAIR , HFA 90 mcg/actuation inhaler TAKE 2 EACH (INHALATION) EVERY 3 TO 4 HOURS (WHEEZING) FOR 30 DAYS ascorbic acid, vitamin C, (VITAMIN C) 500 MG tablet Take 500 mg by mouth once daily cetirizine  (ZYRTEC ) 10 MG tablet Take 10 mg by mouth once daily cholecalciferol (VITAMIN D3) 2,000 unit tablet Take 2,000 Units by mouth 2 (two) times daily 2 times daily diclofenac  (VOLTAREN ) 75 MG EC tablet Take 75 mg by mouth 2 (two) times daily ergocalciferol , vitamin D2, (VITAMIN D2 ORAL) fluticasone  propionate (FLONASE ) 50 mcg/actuation nasal spray 1-2 spray in each nostril Nasally Once a day ipratropium (ATROVENT ) 21 mcg (0.03 %) nasal spray Place 2 sprays into one nostril levonorgestreL  (KYLEENA ) 17.5 mcg/24 hr (5 yrs) 19.5 mg IUD Insert 1 each into the uterus once metoclopramide  (REGLAN ) 10 MG tablet as directed montelukast  (SINGULAIR ) 10 mg tablet Take 10 mg by mouth at bedtime NURTEC ODT 75 mg disintegrating tablet TAKE EVERY OTHER DAY FOR 2 WEEKS, THEN ONCE EVERY 24 HOURS AS NEEDED FOR HEADACHE QULIPTA  60 mg Tab Take 1 tablet by mouth once daily rizatriptan  (MAXALT ) 10 MG tablet TAKE 1 TABLET AS NEEDED FOR MIGRAINE. MAY REPEAT IN 2 HOURS IF NEEDED. MAX 30 MG IN 24  HOURS topiramate  (TOPAMAX )  50 MG tablet Take 50 mg by mouth 2 (two) times daily venlafaxine  (EFFEXOR -XR) 150 MG XR capsule Take 150 mg by mouth once daily  No current facility-administered medications on file prior to visit.  Family History Problem Relation Age of Onset High blood pressure (Hypertension) Mother Obesity Mother Hyperlipidemia (Elevated cholesterol) Father High blood pressure (Hypertension) Father Obesity Father   Social History  Tobacco Use Smoking Status Never Smokeless Tobacco Never   Social History  Socioeconomic History Marital status: Single Tobacco Use Smoking status: Never Smokeless tobacco: Never Substance and Sexual Activity Alcohol use: Yes Drug use: Never  Social Drivers of Catering manager Strain: Medium Risk (09/25/2023) Received from [facility] Overall Financial Resource Strain (CARDIA) Difficulty of Paying Living Expenses: Somewhat hard Food Insecurity: No Food Insecurity (09/25/2023) Received from [facility] Hunger Vital Sign Within the past 12 months, you worried that your food would run out before you got the money to buy more.: Never true Within the past 12 months, the food you bought just didn't last and you didn't have money to get more.: Never true Transportation Needs: No Transportation Needs (09/25/2023) Received from [facility] - Transportation Lack of Transportation (Medical): No Lack of Transportation (Non-Medical): No Physical Activity: Sufficiently Active (09/25/2023) Received from [facility] Exercise Vital Sign On average, how many days per week do you engage in moderate to strenuous exercise (like a brisk walk)?: 3 days On average, how many minutes do you engage in exercise at this level?: 50 min Stress: Stress Concern Present (09/25/2023) Received from [facility] of Occupational Health - Occupational Stress Questionnaire Feeling of Stress : To some extent Social Connections:  Socially Integrated (09/25/2023) Received from [facility] Social Connection and Isolation Panel In a typical week, how many times do you talk on the phone with family, friends, or neighbors?: More than three times a week How often do you get together with friends or relatives?: Once a week How often do you attend church or religious services?: More than 4 times per year Do you belong to any clubs or organizations such as church groups, unions, fraternal or athletic groups, or school groups?: Yes How often do you attend meetings of the clubs or organizations you belong to?: More than 4 times per year Are you married, widowed, divorced, separated, never married, or living with a partner?: Living with partner Housing Stability: Unknown (12/08/2023) Housing Stability Vital Sign Homeless in the Last Year: No  Objective:  Vitals: 12/08/23 1110 12/08/23 1111 BP: 126/85 Pulse: 107 Temp: 36.6 C (97.8 F) SpO2: 98% Weight: (!) 123.1 kg (271 lb 6.4 oz) Height: 170.2 cm (5' 7) PainSc: 0-No pain  Body mass index is 42.51 kg/m.  Gen: alert, NAD, non-toxic appearing Pupils: equal, no scleral icterus Pulm: Lungs clear to auscultation, symmetric chest rise CV: regular rate and rhythm Abd: soft, nontender, nondistended. Ext: no edema, Skin: no rash, no jaundice  Labs, Imaging and Diagnostic Testing:  History of DVT/PE, positive lupus anticoagulant; June 19, 2023-upper GI, chest x-ray normal; labs April 9 including B met, TSH, hemoglobin A1c H. pylori breath test all normal. Ferritin level low at 12 but iron good at 52  Assessment and Plan: Diagnoses and all orders for this visit:  Severe obesity (CMS/HHS-HCC)  Hypertriglyceridemia  History of pulmonary embolism  Other migraine without status migrainosus, not intractable  Mild intermittent asthma without complication (HHS-HCC)  Low ferritin    Assessment & Plan Severe obesity Severe obesity with previous unsuccessful  weight  loss attempts. She is preparing for sleeve gastrectomy to aid in weight loss. Emphasized the necessity of postoperative lifestyle changes, including mindful eating and physical activity, as surgery alone is insufficient for long-term success. Discussed potential weight recurrence, acknowledging obesity as a chronic disease with remission and relapse. Informed consent included surgical risks such as bleeding and rare mortality. Noted potential taste changes post-surgery. - Proceed with sleeve gastrectomy as planned. - Encourage adherence to preoperative meal plan and physical activity. - Educate on postoperative lifestyle changes, including diet and exercise. - Inform about potential taste changes post-surgery and recommend trying different protein supplements, such as Undry protein powders and alkaline water. - reDiscussed surgical risks, including bleeding, blood clots and rare mortality.  We reviewed her preoperative workup. I rediscussed the steps of the procedure including the day of the procedure as well as the typical postoperative course. She read over the surgical consent form and I answered her questions.  Pulmonary embolism with lupus anticoagulant positivity Pulmonary embolism with lupus anticoagulant positivity, requiring careful perioperative management to prevent recurrence. Emphasized prevention of postoperative blood clots with Lovenox injections and activity. Highlighted the importance of adequate fluid intake to prevent dehydration and reduce clot risk. - Administer subcutaneous heparin  preoperatively to prevent blood clots. - Postoperatively, continue Lovenox injections twice daily for one month to prevent blood clots. - Encourage use of compression socks at home and staying active to reduce clot risk. Her postoperative anticoagulation plan was discussed with the bariatric team - Monitor fluid intake postoperatively to prevent dehydration and reduce clot risk.  Migraine  without aura, not intractable Migraine managed with Topamax , effective except for a breakthrough episode in May requiring hospital treatment. Recently started on a new medication, which has been effective. - Continue migraine management with Topamax  and the new medication. - Monitor for breakthrough migraines and seek medical attention if necessary.  Low ferritin - No signs of anemia but does have low ferritin. Instructed the patient to start taking her bariatric multivitamin with iron. Discussed that we will need to monitor this postoperatively  This note has been created using automated tools and reviewed for accuracy by Sharai Overbay MCADAMS Tajana Crotteau.  This patient encounter took 20 minutes today to perform the following: take history, perform exam, review outside records, interpret imaging, counsel the patient on their diagnosis and document encounter, findings & plan in the EHR  No follow-ups on file.  Kayla Hayes. Tanda MD FACS General, Minimally Invasive, & Bariatric Surgery Electronically signed by Tanda Kayla Armour, MD at 12/08/2023 11:59 AM EDT

## 2023-12-18 NOTE — Progress Notes (Addendum)
 Patient reported she think she has a yeast infection, symptoms include burning, itching that is constant.    Dr Tanda paged at 0600 am with immediate return call,  Orders received to run urinalysis with reflex and culture if positive.   7:08 AM Called lab for update about urinalysis result.

## 2023-12-18 NOTE — Telephone Encounter (Signed)
 Patient Product/process development scientist completed.    The patient is insured through [facility]. Patient has [employer], may use a copay card, and/or apply for patient assistance if available.    Ran test claim for enoxaparin (Lovenox) 40 mg/0.4 ml and the current 6 day co-pay is $45.00. [employer] will only pay for #12 at a time)   This test claim was processed through [facility]- copay amounts may vary at other pharmacies due to [employer], or as the patient moves through the different stages of their insurance plan.     Reyes Sharps, CPHT Pharmacy Technician III Certified Patient Advocate [facility] Patient Advocate Team Direct Number: [phone]  Fax: [phone]

## 2023-12-18 NOTE — Progress Notes (Signed)
 PHARMACY CONSULT FOR:  Risk Assessment for Post-Discharge VTE Following Bariatric Surgery  Procedure* Sleeve gastrectomy  Sex F  Black race N  Age (years) 45  BMI (kg/m2) 42  Operation duration (minutes) 50  History of VTE requiring treatment* Yes  Hypercoagulable condition* Yes  Liver disorder* No  Pre-op venous stasis No  Pre-op functional health status Independent   Previous foregut or bariatric surg No  Post-op surgical site infection No  Transfusion intra- or post-op* No  Unplanned readmission No  Unplanned reoperation No  GI perforation/leak/obstruction* No  *specific risk factors for portomesenteric venous thrombosis   Predicted probability of 30-day post-discharge VTE:    1.1396 % estimated using the [facility] Calculator   Recommendation for Discharge: Enoxaparin 40 mg [place] q12h x 4 weeks post-discharge    Kayla Hayes is a 23 y.o. female who underwent  Sleeve gastrectomy on 12/18/23.  Case start: 0803 Case end: 0853   Allergies  Allergen Reactions   Reglan  [Metoclopramide ]     Weird sensations through body, loss of muscle control    Patient Measurements: Height: 5' 7 (170.2 cm) Weight: 122.5 kg (270 lb) IBW/kg (Calculated) : 61.6 Body mass index is 42.29 kg/m.  No results for input(s): WBC, HGB, HCT, PLT, APTT, CREATININE, LABCREA, CREAT24HRUR, MG, PHOS, ALBUMIN, PROT, AST, ALT, ALKPHOS, BILITOT, BILIDIR, IBILI in the last 72 hours. Estimated Creatinine Clearance: 140.2 mL/min (by C-G formula based on SCr of 0.84 mg/dL).    Past Medical History:  Diagnosis Date   Allergy    Anxiety    Asthma    Clotting disorder (HCC) 05/2019   Headache    occular migraines   Pulmonary emboli (HCC) 05/2019   both lungs     Medications Prior to Admission  Medication Sig Dispense Refill Last Dose/Taking   ascorbic acid (VITAMIN C) 500 MG tablet Take 500 mg by mouth daily.   Past Week    Atogepant  (QULIPTA ) 60 MG TABS Take 1 tablet (60 mg total) by mouth daily. 30 tablet 5 12/17/2023   cetirizine  (ZYRTEC ) 10 MG tablet TAKE 1 TABLET (10 MG TOTAL) BY MOUTH AT BEDTIME. USE AS NEEDED FOR ALLERGIES (Patient taking differently: Take 10 mg by mouth at bedtime.) 30 tablet 1 12/17/2023   cholecalciferol (VITAMIN D3) 25 MCG (1000 UNIT) tablet Take 1,000 Units by mouth daily.   Past Week   levonorgestrel  (KYLEENA ) 19.5 MG IUD 1 each by Intrauterine route once.   02/19/2020   methocarbamol  (ROBAXIN ) 500 MG tablet Take 1 tablet (500 mg total) by mouth every 8 (eight) hours as needed for muscle spasms. 30 tablet 0 Taking As Needed   montelukast  (SINGULAIR ) 10 MG tablet TAKE 1 TABLET BY MOUTH EVERYDAY AT BEDTIME 30 tablet 5 12/17/2023   ondansetron  (ZOFRAN -ODT) 4 MG disintegrating tablet Take 1 tablet (4 mg total) by mouth every 8 (eight) hours as needed. 20 tablet 0 Past Month   rizatriptan  (MAXALT ) 10 MG tablet Take 1 tablet (10 mg total) by mouth as needed for migraine. May repeat in 2 hours if needed. Max 30 mg in 24 hours 10 tablet 1 Taking As Needed   topiramate  (TOPAMAX ) 50 MG tablet Take 1 tablet (50 mg total) by mouth at bedtime. 90 tablet 2 12/17/2023   venlafaxine  XR (EFFEXOR -XR) 150 MG 24 hr capsule Take 1 capsule (150 mg total) by mouth daily with breakfast. 90 capsule 3 12/17/2023   albuterol  (VENTOLIN  HFA) 108 (90 Base) MCG/ACT inhaler Inhale 2 puffs into  the lungs every 4 (four) hours as needed. 18 g 5 More than a month   diclofenac  (VOLTAREN ) 75 MG EC tablet Take 1 tablet (75 mg total) by mouth 2 (two) times daily. (Patient not taking: Reported on 11/29/2023) 30 tablet 0 More than a month   fluconazole  (DIFLUCAN ) 150 MG tablet Take 1 tablet (150 mg total) by mouth daily. Repeat in 1 week if needed (Patient not taking: Reported on 11/29/2023) 2 tablet 0 Not Taking   ipratropium (ATROVENT ) 0.03 % nasal spray Place 2 sprays into both nostrils every 12 (twelve) hours. (Patient not taking: Reported  on 11/29/2023) 30 mL 0 Not Taking     Kayla Hayes, PharmD, BCPS Clinical Staff Pharmacist  Hayes Salines Stillinger 12/18/2023,10:12 AM

## 2023-12-18 NOTE — Anesthesia Postprocedure Evaluation (Signed)
 Anesthesia Post Note  Patient: Tehilla D Purifoy  Procedure(s) Performed: GASTRECTOMY, SLEEVE, LAPAROSCOPIC ENDOSCOPY, UPPER GI TRACT     Patient location during evaluation: PACU Anesthesia Type: General Level of consciousness: awake and alert Pain management: pain level controlled Vital Signs Assessment: post-procedure vital signs reviewed and stable Respiratory status: spontaneous breathing, nonlabored ventilation and respiratory function stable Cardiovascular status: blood pressure returned to baseline and stable Postop Assessment: no apparent nausea or vomiting Anesthetic complications: no  No notable events documented.  Last Vitals:  Vitals:   12/18/23 1030 12/18/23 1046  BP: (!) 164/89 (!) 146/86  Pulse: (!) 103 97  Resp: 15 16  Temp:  36.9 C  SpO2: 97% 97%    Last Pain:  Vitals:   12/18/23 1046  TempSrc: Oral  PainSc:                  Zekiah Coen,W. EDMOND

## 2023-12-18 NOTE — Anesthesia Procedure Notes (Signed)
 Procedure Name: Intubation Date/Time: 12/18/2023 7:39 AM  Performed by: Landy Chip HERO, CRNAPre-anesthesia Checklist: Patient identified, Emergency Drugs available, Suction available and Patient being monitored Patient Re-evaluated:Patient Re-evaluated prior to induction Oxygen Delivery Method: Circle System Utilized Preoxygenation: Pre-oxygenation with 100% oxygen Induction Type: IV induction Ventilation: Mask ventilation without difficulty Laryngoscope Size: Mac and 4 Grade View: Grade II Tube type: Oral Tube size: 7.5 mm Number of attempts: 1 Airway Equipment and Method: Stylet Placement Confirmation: ETT inserted through vocal cords under direct vision, positive ETCO2 and breath sounds checked- equal and bilateral Secured at: 23 cm Tube secured with: Tape Dental Injury: Teeth and Oropharynx as per pre-operative assessment

## 2023-12-18 NOTE — Op Note (Signed)
 12/18/2023 Malisa D Zulauf [birth year] [id number]   PRE-OPERATIVE DIAGNOSIS:   Severe obesity (CMS/HHS-HCC)  Hypertriglyceridemia  History of pulmonary embolism/+Lupus Anticoagulant  Other migraine without status migrainosus, not intractable  Mild intermittent asthma without complication (HHS-HCC)  Low ferritin    POST-OPERATIVE DIAGNOSIS:  same  PROCEDURE:  Procedure(s): LAPAROSCOPIC SLEEVE GASTRECTOMY  UPPER GI ENDOSCOPY Laparoscopic bilateral TAP block  SURGEON:  Surgeon(s): Camellia CHRISTELLA Blush, MD FACS FASMBS  ASSISTANTS: Mitzie Freund MD FACS  ANESTHESIA:   general  DRAINS: none   BOUGIE: 40 fr ViSiGi  LOCAL MEDICATIONS USED:   Exparel  EBL: min  SPECIMEN:  Source of Specimen:  Greater curvature of stomach  DISPOSITION OF SPECIMEN:  PATHOLOGY  COUNTS:  YES  INDICATION FOR PROCEDURE: This is a very pleasant 23 y.o.-year-old morbidly obese female who has had unsuccessful attempts for sustained weight loss. The patient presents today for a planned laparoscopic sleeve gastrectomy with upper endoscopy. We have discussed the risk and benefits of the procedure extensively preoperatively. Please see my separate notes.  PROCEDURE: After obtaining informed consent and receiving 5000 units of subcutaneous heparin , the patient was brought to the operating room at [facility] and placed supine on the operating room table. General endotracheal anesthesia was established. Sequential compression devices were placed. A orogastric tube was placed. The patient's abdomen was prepped and draped in the usual standard surgical fashion. The patient received preoperative IV antibiotic. A surgical timeout was performed. ERAS protocol used.   Access to the abdomen was achieved using a 5 mm 0 laparoscope thru a 5 mm trocar In the left upper Quadrant 2 fingerbreadths below the left subcostal margin using the Optiview technique. Pneumoperitoneum was smoothly established up to 15 mm of  mercury. The laparoscope was advanced and the abdominal cavity was surveilled. The patient was then placed in reverse Trendelenburg.   A 5 mm trocar was placed slightly above and to the left of the umbilicus under direct visualization.  The [facility] liver retractor was placed under the left lobe of the liver through a 5 mm trocar incision site in the subxiphoid position. A 5 mm trocar was placed in the lateral right upper quadrant along with a 15 mm trocar in the mid right abdomen. A final 5 mm trocar was placed in the lateral LUQ.  All under direct visualization after exparel had been infiltrated in bilateral lateral upper abdominal walls as a TAP block for postoperative pain relief.  The stomach was inspected. It was completely decompressed and the orogastric tube was removed.  There was no anterior dimple that was obviously visible.   We identified the pylorus and measured 6 cm proximal to the pylorus and identified an area of where we would start taking down the short gastric vessels. Harmonic scalpel was used to take down the short gastric vessels along the greater curvature of the stomach. We were able to enter the lesser sac. We continued to march along the greater curvature of the stomach taking down the short gastrics. As we approached the gastrosplenic ligament we took care in this area not to injure the spleen. We were able to take down the entire gastrosplenic ligament. We then mobilized the fundus away from the left crus of diaphragm. There were not any significant posterior gastric avascular attachments. This left the stomach completely mobilized. No vessels had been taken down along the lesser curvature of the stomach.  We then reidentified the pylorus. A 40Fr ViSiGi was then placed in the oropharynx and advanced down  into the stomach and placed in the distal antrum and positioned along the lesser curvature. It was placed under suction which secured the 40Fr ViSiGi in place along the lesser  curve. Then using the Ethicon echelon 60 mm stapler with a gold load with ethicon staple line reinforcement (ESLR), I placed a stapler along the antrum approximately 5 cm from the pylorus. The stapler was angled so that there is ample room at the angularis incisura. I then fired the first staple load after inspecting it posteriorly to ensure adequate space both anteriorly and posteriorly. At this point I still was not completely past the angularis so with a blue load with ESLR, I placed the stapler in position just inside the prior stapleline. We then rotated the stomach to insure that there was adequate anteriorly as well as posteriorly. The stapler was then fired.  At this point I started using 60 mm blue load staple cartridges with ESLR. The echelon stapler was then repositioned with a 60 mm blue load with ESLR and we continued to march up along the ViSiGi. My assistant was holding traction along the greater curvature stomach along the cauterized short gastric vessels ensuring that the stomach was symmetrically retracted. Prior to each firing of the staple, we rotated the stomach to ensure that there is adequate stomach left.  As we approached the fundus, I used 60 mm blue cartridge with ESLR aiming  lateral to the GE junction after mobilizing some of the esophageal fat pad.  The sleeve was inspected. There is no evidence of cork screw. The staple line appeared hemostatic. The CRNA inflated the ViSiGi to the green zone and the upper abdomen was flooded with saline. There were no bubbles. The sleeve was decompressed and the ViSiGi removed.   My assistant scrubbed out and performed an upper endoscopy. The sleeve easily distended with air and the scope was easily advanced to the pylorus. There is no evidence of internal bleeding or cork screwing. There was no narrowing at the angularis. There is no evidence of bubbles. Please see her operative note for further details. The gastric sleeve was decompressed and the  endoscope was removed.  The greater curvature the stomach was grasped with a laparoscopic grasper and removed from the 15 mm trocar site.  The liver retractor was removed. I then closed the 15 mm trocar site with 1 interrupted 0 Vicryl sutures through the fascia using the endoclose. The closure was viewed laparoscopically and it was airtight. Remaining Exparel was then infiltrated in the preperitoneal spaces around the trocar sites. Pneumoperitoneum was released. All trocar sites were closed with a 4-0 Monocryl in a subcuticular fashion followed by the application of steri-strips, and bandaids. The patient was extubated and taken to the recovery room in stable condition. All needle, instrument, and sponge counts were correct x2. There are no immediate complications  (0) 60 mm green with ESLR (1) 60 mm gold with ESLR (4) 60 mm blue with ESLR  PLAN OF CARE: Admit to inpatient   PATIENT DISPOSITION:  PACU - hemodynamically stable.   Delay start of Pharmacological VTE agent (>24hrs) due to surgical blood loss or risk of bleeding:  no  [name], MD, FACS FASMBS General, Bariatric, & Minimally Invasive Surgery [facility], [place]

## 2023-12-18 NOTE — Progress Notes (Signed)

## 2023-12-18 NOTE — Plan of Care (Signed)
   Problem: Education: Goal: Knowledge of General Education information will improve Description Including pain rating scale, medication(s)/side effects and non-pharmacologic comfort measures Outcome: Progressing

## 2023-12-18 NOTE — Transfer of Care (Signed)
 Immediate Anesthesia Transfer of Care Note  Patient: Kayla Hayes  Procedure(s) Performed: GASTRECTOMY, SLEEVE, LAPAROSCOPIC ENDOSCOPY, UPPER GI TRACT  Patient Location: PACU  Anesthesia Type:General  Level of Consciousness: drowsy  Airway & Oxygen Therapy: Patient Spontanous Breathing and Patient connected to face mask oxygen  Post-op Assessment: Report given to RN and Post -op Vital signs reviewed and stable  Post vital signs: Reviewed and stable  Last Vitals:  Vitals Value Taken Time  BP 145/84 12/18/23 09:18  Temp    Pulse 102 12/18/23 09:20  Resp 19 12/18/23 09:22  SpO2 96 % 12/18/23 09:20  Vitals shown include unfiled device data.  Last Pain:  Vitals:   12/18/23 0546  TempSrc: Oral  PainSc: 0-No pain      Patients Stated Pain Goal: 3 (12/18/23 0546)  Complications: No notable events documented.

## 2023-12-19 ENCOUNTER — Encounter (HOSPITAL_COMMUNITY): Payer: Self-pay | Admitting: General Surgery

## 2023-12-19 LAB — CBC WITH DIFFERENTIAL/PLATELET
Abs Immature Granulocytes: 0.21 10*3/uL — ABNORMAL HIGH (ref 0.00–0.07)
Basophils Absolute: 0 10*3/uL (ref 0.0–0.1)
Basophils Relative: 0 %
Eosinophils Absolute: 0 10*3/uL (ref 0.0–0.5)
Eosinophils Relative: 0 %
HCT: 40.4 % (ref 36.0–46.0)
Hemoglobin: 13.4 g/dL (ref 12.0–15.0)
Immature Granulocytes: 2 %
Lymphocytes Relative: 9 %
Lymphs Abs: 1 10*3/uL (ref 0.7–4.0)
MCH: 28.6 pg (ref 26.0–34.0)
MCHC: 33.2 g/dL (ref 30.0–36.0)
MCV: 86.1 fL (ref 80.0–100.0)
Monocytes Absolute: 0.8 10*3/uL (ref 0.1–1.0)
Monocytes Relative: 7 %
Neutro Abs: 9.5 10*3/uL — ABNORMAL HIGH (ref 1.7–7.7)
Neutrophils Relative %: 82 %
Platelets: 296 10*3/uL (ref 150–400)
RBC: 4.69 MIL/uL (ref 3.87–5.11)
RDW: 13 % (ref 11.5–15.5)
WBC: 11.5 10*3/uL — ABNORMAL HIGH (ref 4.0–10.5)
nRBC: 0 % (ref 0.0–0.2)

## 2023-12-19 LAB — SURGICAL PATHOLOGY

## 2023-12-19 LAB — COMPREHENSIVE METABOLIC PANEL WITH GFR
ALT: 25 U/L (ref 0–44)
AST: 25 U/L (ref 15–41)
Albumin: 4 g/dL (ref 3.5–5.0)
Alkaline Phosphatase: 57 U/L (ref 38–126)
Anion gap: 14 (ref 5–15)
BUN: 5 mg/dL — ABNORMAL LOW (ref 6–20)
CO2: 19 mmol/L — ABNORMAL LOW (ref 22–32)
Calcium: 9.3 mg/dL (ref 8.9–10.3)
Chloride: 107 mmol/L (ref 98–111)
Creatinine, Ser: 0.69 mg/dL (ref 0.44–1.00)
GFR, Estimated: >60 mL/min (ref >60)
Glucose, Bld: 122 mg/dL — ABNORMAL HIGH (ref 70–99)
Potassium: 3.7 mmol/L (ref 3.5–5.1)
Sodium: 140 mmol/L (ref 135–145)
Total Bilirubin: 0.5 mg/dL (ref 0.0–1.2)
Total Protein: 6.8 g/dL (ref 6.5–8.1)

## 2023-12-19 LAB — URINE CULTURE

## 2023-12-19 MED ORDER — VENLAFAXINE HCL ER 150 MG PO CP24
150.0000 mg | ORAL_CAPSULE | Freq: Every day | ORAL | Status: DC
  Administered 2023-12-19 – 2023-12-20 (×2): 150 mg via ORAL
  Filled 2023-12-19 (×2): qty 1

## 2023-12-19 MED ORDER — VENLAFAXINE HCL ER 150 MG PO CP24: 150.0000 mg | ORAL_CAPSULE | Freq: Every day | ORAL | Status: DC

## 2023-12-19 MED ORDER — KCL IN DEXTROSE-NACL 20-5-0.45 MEQ/L-%-% IV SOLN
INTRAVENOUS | Status: AC
  Filled 2023-12-19 (×3): qty 1000

## 2023-12-19 NOTE — Progress Notes (Signed)
 Patient alert and oriented, sitting upright in bed. Mother at bedside. Provided support and encouragement.  Encouraged pulmonary toilet, ambulation, and small sips of liquids.  Patient still working on P.O. water intake. Surgeon present during rounding. All questions answered. Will continue to monitor.

## 2023-12-19 NOTE — Progress Notes (Signed)
 1 Day Post-Op   Subjective/Chief Complaint: Going slow with water.  Cold water uncomfortable but room temperature water going a little bit better.  No vomiting.  Has been walking.  Family member at bedside   Objective: Vital signs in last 24 hours: Temp:  [97.6 F (36.4 C)-99.2 F (37.3 C)] 98.2 F (36.8 C) (07/01 1330) Pulse Rate:  [54-81] 55 (07/01 1330) Resp:  [16-18] 18 (07/01 1330) BP: (136-156)/(79-94) 156/84 (07/01 1330) SpO2:  [97 %-100 %] 100 % (07/01 1330) Last BM Date : 12/18/23  Intake/Output from previous day: 06/30 0701 - 07/01 0700 In: 3792.4 [P.O.:180; I.V.:3512.4; IV Piggyback:100] Out: 1915 [Urine:1900; Blood:15] Intake/Output this shift: Total I/O In: 680 [P.O.:180; I.V.:500] Out: 800 [Urine:800]  Alert, nontoxic, not ill-appearing Symmetric chest rise, nonlabored Regular Soft, mild appropriate tenderness, incisions look okay.  Dressings intact No edema  Lab Results:  Recent Labs    12/18/23 1025 12/18/23 1455 12/19/23 0519  WBC 8.8  --  11.5*  HGB 13.5 13.6 13.4  HCT 42.0 40.8 40.4  PLT 243  --  296   BMET Recent Labs    12/18/23 1025 12/19/23 0642  NA  --  140  K  --  3.7  CL  --  107  CO2  --  19*  GLUCOSE  --  122*  BUN  --  5*  CREATININE 0.74 0.69  CALCIUM  --  9.3   PT/INR No results for input(s): LABPROT, INR in the last 72 hours. ABG No results for input(s): PHART, HCO3 in the last 72 hours.  Invalid input(s): PCO2, PO2  Studies/Results: No results found.  Anti-infectives: Anti-infectives (From admission, onward)    Start     Dose/Rate Route Frequency Ordered Stop   12/18/23 0615  fluconazole  (DIFLUCAN ) tablet 150 mg        150 mg Oral  Once 12/18/23 9390 12/18/23 0723   12/18/23 0600  cefoTEtan (CEFOTAN) 2 g in sodium chloride  0.9 % 100 mL IVPB        2 g 200 mL/hr over 30 Minutes Intravenous On call to O.R. 12/18/23 0510 12/19/23 1225       Assessment/Plan: s/p Procedure(s): GASTRECTOMY,  SLEEVE, LAPAROSCOPIC (N/A) ENDOSCOPY, UPPER GI TRACT (N/A)   Oral intake is going slow.  Patient is not safe for discharge today high risk for dehydration.  Moreover patient has not advance to proteins yet Discussed doing room temperature liquids as well as alkaline water Also discussed that when she does get her protein shakes that she may do better with our chicken soup protein shake Continue chemical VTE prophylaxis Restart home meds Patient will need 1 month of Lovenox given her history of lupus anticoagulant and blood clots.  Patient to receive Lovenox teaching  Hopefully home tomorrow  LOS: 0 days    Kayla Hayes 12/19/2023

## 2023-12-19 NOTE — Progress Notes (Signed)
   12/19/23 0846  TOC Brief Assessment  Insurance and Status Reviewed  Patient has primary care physician Yes  Home environment has been reviewed resides in private residence  Prior level of function: Independent  Prior/Current Home Services No current home services  Social Drivers of Health Review SDOH reviewed no interventions necessary  Readmission risk has been reviewed Yes  Transition of care needs no transition of care needs at this time

## 2023-12-19 NOTE — Progress Notes (Signed)
 Rounded back in on patient and to help work towards requirements for discharge - went on ahead and reviewed discharge education with the patient and her mother. Patient alert and oriented, working towards pain control. Patient is tolerating fluids, has not yet advanced to protein. Reviewed Gastric sleeve/bypass discharge instructions with patient and patient is able to articulate understanding. Provided information on BELT program, Support Group, BSTOP-D, and [facility] outpatient pharmacy.  All questions answered. 24hr fluid recall is 180 mL per hydration protocol, bariatric nurse coordinator to make follow-up phone call within one week.  Patient still has bariatric goals to work on for discharge readiness. Will continue to assess.

## 2023-12-19 NOTE — Plan of Care (Signed)

## 2023-12-20 ENCOUNTER — Encounter (HOSPITAL_COMMUNITY): Payer: Self-pay | Admitting: *Deleted

## 2023-12-20 ENCOUNTER — Other Ambulatory Visit (HOSPITAL_COMMUNITY): Payer: Self-pay

## 2023-12-20 LAB — CBC WITH DIFFERENTIAL/PLATELET
Abs Immature Granulocytes: 0.02 10*3/uL (ref 0.00–0.07)
Basophils Absolute: 0 10*3/uL (ref 0.0–0.1)
Basophils Relative: 0 %
Eosinophils Absolute: 0 10*3/uL (ref 0.0–0.5)
Eosinophils Relative: 0 %
HCT: 41.1 % (ref 36.0–46.0)
Hemoglobin: 13.5 g/dL (ref 12.0–15.0)
Immature Granulocytes: 0 %
Lymphocytes Relative: 23 %
Lymphs Abs: 1.8 10*3/uL (ref 0.7–4.0)
MCH: 28.8 pg (ref 26.0–34.0)
MCHC: 32.8 g/dL (ref 30.0–36.0)
MCV: 87.6 fL (ref 80.0–100.0)
Monocytes Absolute: 0.6 10*3/uL (ref 0.1–1.0)
Monocytes Relative: 8 %
Neutro Abs: 5.3 10*3/uL (ref 1.7–7.7)
Neutrophils Relative %: 69 %
Platelets: 245 10*3/uL (ref 150–400)
RBC: 4.69 MIL/uL (ref 3.87–5.11)
RDW: 13.2 % (ref 11.5–15.5)
WBC: 7.7 10*3/uL (ref 4.0–10.5)
nRBC: 0 % (ref 0.0–0.2)

## 2023-12-20 MED ORDER — ENOXAPARIN SODIUM 40 MG/0.4ML IJ SOSY
40.0000 mg | PREFILLED_SYRINGE | Freq: Two times a day (BID) | INTRAMUSCULAR | 1 refills | Status: DC
  Filled 2023-12-20: qty 12, 15d supply, fill #0

## 2023-12-20 MED ORDER — SUCRALFATE 1 GM/10ML PO SUSP
1.0000 g | Freq: Three times a day (TID) | ORAL | 1 refills | Status: DC
  Filled 2023-12-20: qty 420, 11d supply, fill #0

## 2023-12-20 MED ORDER — ACETAMINOPHEN 500 MG PO TABS: 1000.0000 mg | ORAL_TABLET | Freq: Three times a day (TID) | ORAL | Status: AC

## 2023-12-20 MED ORDER — OXYCODONE HCL 5 MG PO TABS
5.0000 mg | ORAL_TABLET | Freq: Four times a day (QID) | ORAL | 0 refills | Status: DC | PRN
  Filled 2023-12-20: qty 5, 2d supply, fill #0

## 2023-12-20 MED ORDER — SUCRALFATE 1 GM/10ML PO SUSP
1.0000 g | Freq: Three times a day (TID) | ORAL | Status: DC
  Administered 2023-12-20: 1 g via ORAL
  Filled 2023-12-20: qty 10

## 2023-12-20 MED ORDER — GABAPENTIN 100 MG PO CAPS
100.0000 mg | ORAL_CAPSULE | Freq: Two times a day (BID) | ORAL | 0 refills | Status: DC
  Filled 2023-12-20: qty 10, 5d supply, fill #0

## 2023-12-20 MED ORDER — SUCRALFATE 1 GM/10ML PO SUSP
1.0000 g | Freq: Three times a day (TID) | ORAL | Status: DC
  Administered 2023-12-20 (×2): 1 g via ORAL
  Filled 2023-12-20 (×2): qty 10

## 2023-12-20 MED ORDER — ONDANSETRON 4 MG PO TBDP
4.0000 mg | ORAL_TABLET | Freq: Four times a day (QID) | ORAL | 0 refills | Status: AC | PRN
  Filled 2023-12-20: qty 20, 5d supply, fill #0

## 2023-12-20 MED ORDER — PANTOPRAZOLE SODIUM 40 MG PO TBEC
40.0000 mg | DELAYED_RELEASE_TABLET | Freq: Every day | ORAL | 0 refills | Status: AC
  Filled 2023-12-20: qty 31, 31d supply, fill #0

## 2023-12-20 NOTE — Progress Notes (Signed)
 Discharge instructions given to patient, discharge medications delivered to patient at bedside, Home meds stored in pharmacy returned to patient. JONETTA Ryder RN

## 2023-12-20 NOTE — Progress Notes (Addendum)
 Patient alert and oriented, Post op day 2.  Provided support and encouragement.  Encouraged pulmonary toilet, ambulation and small sips of liquids.  Patient still working towards P.O. intake goals. States that the thinner liquids are going down easier than the thicker shakes. Reminded pt that the [facility] outpatient pharmacies also carry the Unjury protein powders.  All questions answered. No further questions at this time. Patient has been notified that she will need to take her Lovenox medication for the entire 30 days as prescribed.  Patient has been notified that an appointment for IV fluids has been scheduled for  Monday, 12/25/23 at 9:45 am  Location [facility].  [address]  [phone]

## 2023-12-20 NOTE — Progress Notes (Signed)
 December 20, 2023   ARDELL AARONSON [address] [place] [address]   Dear Ms. Spink,  Please note that it is important to continually aim for 64 fl oz of liquid daily by sipping throughout each day (especially for the first 2 weeks after surgery). You have an IV Fluid Appointment scheduled on Monday, December 25, 2023 at 9:45 am at [facility].   The Location Address for your IV fluids appointment is  [address]  Contact # [phone]   In addition, please be sure to take your Lovenox Medication as prescribed by your surgeon for the full duration - which may require a refill pick-up to complete the entire prescribed amount.   Your health and follow-up medical care are important to us . Please call our [facility] office at [phone] if you have any further questions or concerns.   Sincerely,    Bariatrics and Wellness Department  [phone]

## 2023-12-21 ENCOUNTER — Other Ambulatory Visit: Payer: Self-pay | Admitting: General Surgery

## 2023-12-21 DIAGNOSIS — E860 Dehydration: Secondary | ICD-10-CM | POA: Insufficient documentation

## 2023-12-21 NOTE — Discharge Summary (Signed)
 Physician Discharge Summary  Kayla Hayes FMW:[id number] DOB: 12/28/2000 DOA: 12/18/2023  PCP: Watt Harlene BROCKS, MD  Admit date: 12/18/2023 Discharge date: 12/20/2023  Recommendations for Outpatient Follow-up:    Follow-up Information     Tanda Locus, MD Follow up.   Specialty: General Surgery Why: Please arrive 15 minutes prior to your appointment at 2:45pm Contact information: [address] [place] [address] [phone]         Tari Tonja Barban, [place] Follow up on 02/15/2024.   Specialty: General Surgery Why: Please arrive 15 minutes prior to your appointment 9:15am Contact information: [address] [facility] [place] [address] [phone]                Discharge Diagnoses:  Principal Problem:   S/P laparoscopic sleeve gastrectomy Severe obesity (CMS/HHS-HCC)  Hypertriglyceridemia  History of pulmonary embolism/+Lupus Anticoagulant  Other migraine without status migrainosus, not intractable  Mild intermittent asthma without complication (HHS-HCC)  Low ferritin   Surgical Procedure: Laparoscopic Sleeve Gastrectomy, upper endoscopy  Discharge Condition: Good Disposition: Home  Diet recommendation: Postoperative sleeve gastrectomy diet (liquids only)  Filed Weights   12/18/23 0546  Weight: 122.5 kg     Hospital Course:  The patient was admitted for a planned laparoscopic sleeve gastrectomy. Please see operative note. Preoperatively the patient was given 5000 units of subcutaneous heparin  for DVT prophylaxis. Postoperative prophylactic heparin  dosing was started on the evening of postoperative day 0. ERAS protocol was used. On the evening of postoperative day 0, the patient was started on water and ice chips. On postoperative day 1 the patient had no fever or tachycardia and was having difficulty tolerating water so  their diet was not advanced throughout the day. Later that evening, water was  going better. On POD 2 pt had some initial difficulty with protein shakes. Added carafate and switched to chicken soup protein shakes which went better. The patient was ambulating without difficulty. Their vital signs are stable without fever or tachycardia. Their hemoglobin had remained stable. The patient had received discharge instructions and counseling. They were deemed stable for discharge and had met discharge criteria  Pt was sent out on 60mo lovenox prophylaxis due to her h/o PE/lupus anticoagulant  BP (!) 148/91 (BP Location: Right Arm)   Pulse 76   Temp 98 F (36.7 C) (Oral)   Resp 16   Ht 5' 7 (1.702 m)   Wt 122.5 kg   LMP 11/30/2023   SpO2 100%   BMI 42.29 kg/m   Gen: alert, NAD, non-toxic appearing Pupils: equal, no scleral icterus Pulm: Lungs clear to auscultation, symmetric chest rise CV: regular rate and rhythm Abd: soft, approp tender, nondistended. . No cellulitis. No incisional hernia Ext: no edema, no calf tenderness Skin: no rash, no jaundice    Discharge Instructions  Discharge Instructions     Ambulate hourly while awake   Complete by: As directed    Call MD for:  difficulty breathing, headache or visual disturbances   Complete by: As directed    Call MD for:  persistant dizziness or light-headedness   Complete by: As directed    Call MD for:  persistant nausea and vomiting   Complete by: As directed    Call MD for:  redness, tenderness, or signs of infection (pain, swelling, redness, odor or green/yellow discharge around incision site)   Complete by: As directed    Call MD for:  severe uncontrolled pain   Complete  by: As directed    Call MD for:  temperature >101 F   Complete by: As directed    Diet bariatric full liquid   Complete by: As directed    Discharge instructions   Complete by: As directed    See bariatric discharge instructions   Incentive spirometry   Complete by: As directed    Perform hourly while awake      Allergies as of  12/20/2023       Reactions   Reglan  [metoclopramide ]    Weird sensations through body, loss of muscle control        Medication List     STOP taking these medications    diclofenac  75 MG EC tablet Commonly known as: VOLTAREN    fluconazole  150 MG tablet Commonly known as: Diflucan        TAKE these medications    acetaminophen  500 MG tablet Commonly known as: TYLENOL  Take 2 tablets (1,000 mg total) by mouth every 8 (eight) hours for 5 days.   albuterol  108 (90 Base) MCG/ACT inhaler Commonly known as: VENTOLIN  HFA Inhale 2 puffs into the lungs every 4 (four) hours as needed.   ascorbic acid 500 MG tablet Commonly known as: VITAMIN C Take 500 mg by mouth daily.   cetirizine  10 MG tablet Commonly known as: ZYRTEC  TAKE 1 TABLET (10 MG TOTAL) BY MOUTH AT BEDTIME. USE AS NEEDED FOR ALLERGIES What changed: additional instructions   cholecalciferol 25 MCG (1000 UNIT) tablet Commonly known as: VITAMIN D3 Take 1,000 Units by mouth daily.   enoxaparin 40 MG/0.4ML injection Commonly known as: LOVENOX Inject 0.4 mLs (40 mg total) into the skin every 12 (twelve) hours.   gabapentin 100 MG capsule Commonly known as: NEURONTIN Take 1 capsule (100 mg total) by mouth every 12 (twelve) hours for 5 days.   ipratropium 0.03 % nasal spray Commonly known as: ATROVENT  Place 2 sprays into both nostrils every 12 (twelve) hours.   Kyleena  19.5 MG IUD Generic drug: levonorgestrel  1 each by Intrauterine route once. Notes to patient: Medication may not be effective for the next 30 days.  Use precaution if necessary.     methocarbamol  500 MG tablet Commonly known as: Robaxin  Take 1 tablet (500 mg total) by mouth every 8 (eight) hours as needed for muscle spasms.   montelukast  10 MG tablet Commonly known as: SINGULAIR  TAKE 1 TABLET BY MOUTH EVERYDAY AT BEDTIME   ondansetron  4 MG disintegrating tablet Commonly known as: ZOFRAN -ODT Take 1 tablet (4 mg total) by mouth every 6 (six)  hours as needed for nausea or vomiting. What changed:  when to take this reasons to take this   oxyCODONE  5 MG immediate release tablet Commonly known as: Oxy IR/ROXICODONE  Take 1 tablet (5 mg total) by mouth every 6 (six) hours as needed for breakthrough pain or severe pain (pain score 7-10).   pantoprazole 40 MG tablet Commonly known as: PROTONIX Take 1 tablet (40 mg total) by mouth daily.   Qulipta  60 MG Tabs Generic drug: Atogepant  Take 1 tablet (60 mg total) by mouth daily.   rizatriptan  10 MG tablet Commonly known as: Maxalt  Take 1 tablet (10 mg total) by mouth as needed for migraine. May repeat in 2 hours if needed. Max 30 mg in 24 hours   sucralfate 1 GM/10ML suspension Commonly known as: CARAFATE Take 10 mLs (1 g total) by mouth 4 (four) times daily -  with meals and at bedtime.   topiramate  50 MG tablet Commonly known as:  TOPAMAX  Take 1 tablet (50 mg total) by mouth at bedtime.   venlafaxine  XR 150 MG 24 hr capsule Commonly known as: EFFEXOR -XR Take 1 capsule (150 mg total) by mouth daily with breakfast. Notes to patient: Schedule an appointment no later than one month post-op with your physician for appropriate management of any psych medications following surgery         Follow-up Information     Tanda Locus, MD Follow up.   Specialty: General Surgery Why: Please arrive 15 minutes prior to your appointment at 2:45pm Contact information: [address] [place] [address] [phone]         Tari Tonja Barban, [place] Follow up on 02/15/2024.   Specialty: General Surgery Why: Please arrive 15 minutes prior to your appointment 9:15am Contact information: [address] [facility] [place] [address] [phone]                  The results of significant diagnostics from this hospitalization (including imaging, microbiology, ancillary and laboratory) are listed below for reference.     Significant Diagnostic Studies: No results found.  Labs: Basic Metabolic Panel: Recent Labs  Lab 12/18/23 1025 12/19/23 0642  NA  --  140  K  --  3.7  CL  --  107  CO2  --  19*  GLUCOSE  --  122*  BUN  --  5*  CREATININE 0.74 0.69  CALCIUM  --  9.3   Liver Function Tests: Recent Labs  Lab 12/19/23 0642  AST 25  ALT 25  ALKPHOS 57  BILITOT 0.5  PROT 6.8  ALBUMIN 4.0    CBC: Recent Labs  Lab 12/18/23 1025 12/18/23 1455 12/19/23 0519 12/20/23 0501  WBC 8.8  --  11.5* 7.7  NEUTROABS  --   --  9.5* 5.3  HGB 13.5 13.6 13.4 13.5  HCT 42.0 40.8 40.4 41.1  MCV 88.8  --  86.1 87.6  PLT 243  --  296 245    CBG: No results for input(s): GLUCAP in the last 168 hours.  Principal Problem:   S/P laparoscopic sleeve gastrectomy   Time coordinating discharge: 25 min  Signed:  Locus CHRISTELLA Tanda, MD [facility] [phone] 12/21/2023, 1:12 PM

## 2023-12-25 ENCOUNTER — Ambulatory Visit

## 2023-12-25 ENCOUNTER — Telehealth (HOSPITAL_COMMUNITY): Payer: Self-pay | Admitting: *Deleted

## 2023-12-25 MED ORDER — SODIUM CHLORIDE 0.9 % IV BOLUS
1000.0000 mL | Freq: Once | INTRAVENOUS | Status: AC
  Filled 2023-12-25: qty 1000

## 2023-12-25 NOTE — Telephone Encounter (Signed)
 1. Tell me about your pain and pain management?  Overall patient states that throughout the daytime she has done well with pain control.  Pt states that s/he has been experiencing some intermittent abdominal pain, primarily while resting throughout the night. Patient stated that she notices it more when she has slept on her side Encouraged  pt to utilize some additional pillows to help support both her abdominal area and back and to use the prescribed pain medication if needed to help address pain.  Pt can tolerate protein shakes and water . Pt instructed to reach out if she still had questions or concerns.    2. Let's talk about fluid intake. How much total fluid are you taking in?   Pt states that s/he is getting in at least 30oz of fluid including protein water , bottled water , and yogurt.Patient stated that she did not feel dehydrated.    3. How much protein have you taken in the last day?   Pt states that she is working to meet the goal of 60g of protein, she has however only been able to consume approximately 25g of protein intake.    4. Have you had nausea? Tell me about when you have experienced nausea and what you did to help?   Pt denies nausea.   5. Has the frequency or color changed with your urine?   Pt did not report any changes in frequency or urgency.   6. Tell me what your incisions look like?   Incisions look fine. Pt denies a fever, chills. Pt states incisions are not swollen, open, or draining. Pt encouraged to call [facility] if incisions change.     7. Have you been passing gas? BM?   Pt states that they are having BMs.   Pt states that they have had a BM on Sunday after taking a laxative on Fri & Sat last week.. Pt instructed to take either Miralax  or MoM as instructed per [facility] Instructions. Pt to call surgeon's office if not able to have BM with medication.      8. If a problem or question were to arise who would you call? Do  you know contact numbers for [facility], [facility], and [facility]?   Pt knows to call [facility] for surgical, [facility] for nutrition, and [facility] for non-urgent questions or concerns. Pt denies dehydration symptoms. Pt can describe s/sx of dehydration.   9. How has the walking going?   Pt states s/he is walking around and able to be active without difficulty.   10. How has the breathing been going?  Patient did not report any difficulty with breathing or activity.  11. How are your vitamins and calcium going? How are you taking them?     Pt states that s/he is taking her vitamins without difficulty. Patient stated that she hadn't started her calcium tablet yet, but upon reminder today she plans to start taking them.  Reminded patient that the multivitamin is to be taken 2hrs separate from the calcium tablet.

## 2023-12-28 ENCOUNTER — Ambulatory Visit

## 2023-12-28 VITALS — BP 134/82 | HR 85 | Temp 98.2°F | Resp 16 | Ht 67.0 in | Wt 253.0 lb

## 2023-12-28 DIAGNOSIS — E860 Dehydration: Secondary | ICD-10-CM

## 2023-12-28 MED ORDER — SODIUM CHLORIDE 0.9 % IV BOLUS
1000.0000 mL | Freq: Once | INTRAVENOUS | Status: AC
  Administered 2023-12-28: 1000 mL via INTRAVENOUS
  Filled 2023-12-28: qty 1000

## 2023-12-28 NOTE — Progress Notes (Signed)
 Diagnosis: Dehydration  Provider:  Praveen Mannam MD  Procedure: IV Infusion  IV Type: Peripheral, IV Location: L Forearm  Normal Saline, Dose: 1000 ml  Infusion Start Time: 1418  Infusion Stop Time: 1525  Post Infusion IV Care: Peripheral IV Discontinued  Discharge: Condition: Good, Destination: Home . AVS Declined  Performed by:  Leita FORBES Miles, LPN

## 2024-01-02 ENCOUNTER — Encounter: Payer: Self-pay | Admitting: Dietician

## 2024-01-02 ENCOUNTER — Encounter: Attending: General Surgery | Admitting: Dietician

## 2024-01-02 VITALS — Ht 67.0 in | Wt 249.6 lb

## 2024-01-02 DIAGNOSIS — E669 Obesity, unspecified: Secondary | ICD-10-CM | POA: Diagnosis present

## 2024-01-02 NOTE — Progress Notes (Signed)
 2 Week Post-Operative Nutrition Class   Patient was seen on 01/02/2024 for Post-Operative Nutrition education at the [facility].    Surgery date: 12/18/2023 Surgery type: Sleeve Gastrectomy  Anthropometrics  Start weight at [facility]: 275.2 lbs (date: 07/10/2023)  Height: 67 in Weight today: 249.6 lb   Clinical   Medical hx: obesity, asthma Medications: venlafaxine , cetirizine , montelukast , topamax , vit D, kyleena  (IUD)   Labs: triglycerides 343.0, VLDL 68.6 Notable signs/symptoms: none noted Any previous deficiencies? No Bowel Habits: Every day to every other day no complaints   Body Composition Scale 01/02/2024  Current Body Weight 249.6  Total Body Fat % 42.0  Visceral Fat 9  Fat-Free Mass % 57.9   Total Body Water  % 43.4  Muscle-Mass lbs 35.8  BMI 38.8  Body Fat Displacement          Torso  lbs 64.9         Left Leg  lbs 12.9         Right Leg  lbs 12.9         Left Arm  lbs 6.4         Right Arm  lbs 6.4    The following the learning objectives were met by the patient during this course: Identifies Soft Prepped Plan Advancement Guide  Identifies Soft, High Proteins (Phase 1), beginning 2 weeks post-operatively to 3 weeks post-operatively Identifies Additional Soft High Proteins, soft non-starchy vegetables, fruits and starches (Phase 2), beginning 3 weeks post-operatively to 3 months post-operatively Identifies appropriate sources of fluids, proteins, vegetables, fruits and starches Identifies appropriate fat sources and healthy verses unhealthy fat types   States protein, vegetable, fruit and starch recommendations and appropriate sources post-operatively Identifies the need for appropriate texture modifications, mastication, and bite sizes when consuming solids Identifies appropriate fat consumption and sources Identifies appropriate multivitamin and calcium sources post-operatively Describes the need for physical activity  post-operatively and will follow MD recommendations States when to call healthcare provider regarding medication questions or post-operative complications   Handouts given during class include: Soft Prepped Plan Advancement Guide   Follow-Up Plan: Patient will follow-up at [facility] in 10 weeks for 3 month post-op nutrition visit for diet advancement per MD.

## 2024-01-06 ENCOUNTER — Other Ambulatory Visit: Payer: Self-pay | Admitting: Family Medicine

## 2024-01-08 ENCOUNTER — Other Ambulatory Visit: Payer: Self-pay

## 2024-01-08 ENCOUNTER — Emergency Department (HOSPITAL_COMMUNITY)

## 2024-01-08 ENCOUNTER — Emergency Department (HOSPITAL_COMMUNITY)
Admission: EM | Admit: 2024-01-08 | Discharge: 2024-01-08 | Disposition: A | Discharge status: Home / Self Care | Source: Ambulatory Visit | Attending: Emergency Medicine | Admitting: Emergency Medicine

## 2024-01-08 ENCOUNTER — Encounter (HOSPITAL_COMMUNITY): Payer: Self-pay | Admitting: Emergency Medicine

## 2024-01-08 DIAGNOSIS — R112 Nausea with vomiting, unspecified: Secondary | ICD-10-CM | POA: Diagnosis not present

## 2024-01-08 DIAGNOSIS — R000 Tachycardia, unspecified: Secondary | ICD-10-CM | POA: Diagnosis not present

## 2024-01-08 DIAGNOSIS — R1084 Generalized abdominal pain: Secondary | ICD-10-CM | POA: Diagnosis present

## 2024-01-08 DIAGNOSIS — K5900 Constipation, unspecified: Secondary | ICD-10-CM | POA: Insufficient documentation

## 2024-01-08 DIAGNOSIS — J45909 Unspecified asthma, uncomplicated: Secondary | ICD-10-CM | POA: Insufficient documentation

## 2024-01-08 LAB — COMPREHENSIVE METABOLIC PANEL WITH GFR
ALT: 32 U/L (ref 0–44)
AST: 28 U/L (ref 15–41)
Albumin: 4.4 g/dL (ref 3.5–5.0)
Alkaline Phosphatase: 66 U/L (ref 38–126)
Anion gap: 19 — ABNORMAL HIGH (ref 5–15)
BUN: 6 mg/dL (ref 6–20)
CO2: 15 mmol/L — ABNORMAL LOW (ref 22–32)
Calcium: 9.6 mg/dL (ref 8.9–10.3)
Chloride: 107 mmol/L (ref 98–111)
Creatinine, Ser: 0.72 mg/dL (ref 0.44–1.00)
GFR, Estimated: >60 mL/min (ref >60)
Glucose, Bld: 76 mg/dL (ref 70–99)
Potassium: 3.2 mmol/L — ABNORMAL LOW (ref 3.5–5.1)
Sodium: 141 mmol/L (ref 135–145)
Total Bilirubin: 1.1 mg/dL (ref 0.0–1.2)
Total Protein: 7.1 g/dL (ref 6.5–8.1)

## 2024-01-08 LAB — URINALYSIS, ROUTINE W REFLEX MICROSCOPIC
Bilirubin Urine: NEGATIVE
Glucose, UA: NEGATIVE mg/dL
Hgb urine dipstick: NEGATIVE
Ketones, ur: 80 mg/dL — AB
Leukocytes,Ua: NEGATIVE
Nitrite: NEGATIVE
Protein, ur: 100 mg/dL — AB
Specific Gravity, Urine: 1.028 (ref 1.005–1.030)
pH: 5 (ref 5.0–8.0)

## 2024-01-08 LAB — CBC
HCT: 44.7 % (ref 36.0–46.0)
Hemoglobin: 14.6 g/dL (ref 12.0–15.0)
MCH: 27.9 pg (ref 26.0–34.0)
MCHC: 32.7 g/dL (ref 30.0–36.0)
MCV: 85.3 fL (ref 80.0–100.0)
Platelets: 329 K/uL (ref 150–400)
RBC: 5.24 MIL/uL — ABNORMAL HIGH (ref 3.87–5.11)
RDW: 13.6 % (ref 11.5–15.5)
WBC: 4.7 K/uL (ref 4.0–10.5)
nRBC: 0 % (ref 0.0–0.2)

## 2024-01-08 LAB — HCG, SERUM, QUALITATIVE: Preg, Serum: NEGATIVE

## 2024-01-08 LAB — LIPASE, BLOOD: Lipase: 42 U/L (ref 11–51)

## 2024-01-08 MED ORDER — ONDANSETRON HCL 4 MG/2ML IJ SOLN
4.0000 mg | Freq: Once | INTRAMUSCULAR | Status: AC
  Administered 2024-01-08: 4 mg via INTRAVENOUS
  Filled 2024-01-08: qty 2

## 2024-01-08 MED ORDER — SODIUM CHLORIDE (PF) 0.9 % IJ SOLN
INTRAMUSCULAR | Status: AC
  Filled 2024-01-08: qty 50

## 2024-01-08 MED ORDER — MORPHINE SULFATE (PF) 4 MG/ML IV SOLN
4.0000 mg | Freq: Once | INTRAVENOUS | Status: AC
  Administered 2024-01-08: 4 mg via INTRAVENOUS
  Filled 2024-01-08: qty 1

## 2024-01-08 MED ORDER — POTASSIUM CHLORIDE 10 MEQ/100ML IV SOLN
10.0000 meq | INTRAVENOUS | Status: AC
  Administered 2024-01-08 (×2): 10 meq via INTRAVENOUS
  Filled 2024-01-08 (×2): qty 100

## 2024-01-08 MED ORDER — IOHEXOL 300 MG/ML  SOLN
100.0000 mL | Freq: Once | INTRAMUSCULAR | Status: AC | PRN
  Administered 2024-01-08: 100 mL via INTRAVENOUS

## 2024-01-08 MED ORDER — SODIUM CHLORIDE 0.9 % IV BOLUS
1000.0000 mL | Freq: Once | INTRAVENOUS | Status: AC
  Administered 2024-01-08: 1000 mL via INTRAVENOUS

## 2024-01-08 NOTE — Progress Notes (Signed)
 Presented to ED with no reported BM in over 2 weeks, lower abdominal discomfort worse after PO intake, and nausea. One episode of emesis. States she has had lower abd discomfort 3-4 days, worse last 2 days. Took zofran  without relief, tried miralax  and suppository without relief.   Vitals, labs, CT scan reassuring without surgical complication, bowel obstruction or constipation.  K 3.2 being repleted Some signs of dehydration improved after fluids.   Our office arranged IVF at infusion center tomorrow Ill try to arrange for follow up in our office in one week to confirm that she is improving.  Recommend PRN tylenol  for pain. Recommend miralax  1-2 times daily, suppository daily PRN   Leonell Lobdell, [facility] Please see Amion for pager number during day hours 7:00am-4:30pm

## 2024-01-08 NOTE — ED Provider Notes (Signed)
 [place] EMERGENCY DEPARTMENT AT [facility] Provider Note   CSN: [id number] Arrival date & time: 01/08/24  1123     Patient presents with: Constipation, Emesis, and Nausea   Kayla Hayes is a 23 y.o. 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  Reports that she is not passing gas.  Endorses abdominal pain, nausea, vomiting.  Rates pain 7-8 out of 10.  Denies any fever, chills.  Reports that her incision sites seem to be healing appropriately.  Her surgeon Dr. Tanda told her to come in to rule out bowel obstruction.    Constipation Associated symptoms: vomiting   Emesis      Prior to Admission medications   Medication Sig Start Date End Date Taking? Authorizing Provider  albuterol  (VENTOLIN  HFA) 108 (90 Base) MCG/ACT inhaler Inhale 2 puffs into the lungs every 4 (four) hours as needed. 12/14/22   Copland, Jessica C, MD  ascorbic acid (VITAMIN C) 500 MG tablet Take 500 mg by mouth daily.    [provider]  Atogepant  (QULIPTA ) 60 MG TABS Take 1 tablet (60 mg total) by mouth daily. 11/20/23   Copland, Harlene BROCKS, MD  cetirizine  (ZYRTEC ) 10 MG tablet Take 1 tablet (10 mg total) by mouth at bedtime as needed for allergies or rhinitis. 01/08/24   Copland, Jessica C, MD  cholecalciferol (VITAMIN D3) 25 MCG (1000 UNIT) tablet Take 1,000 Units by mouth daily.    [provider]  enoxaparin  (LOVENOX ) 40 MG/0.4ML injection Inject 0.4 mLs (40 mg total) into the skin every 12 (twelve) hours. 12/20/23 01/19/24  Tanda Locus, MD  gabapentin  (NEURONTIN ) 100 MG capsule Take 1 capsule (100 mg total) by mouth every 12 (twelve) hours for 5 days. 12/20/23 12/25/23  Tanda Locus, MD  ipratropium (ATROVENT ) 0.03 % nasal spray Place 2 sprays into both nostrils every 12 (twelve) hours. Patient not taking: Reported on 11/29/2023 10/23/23   Teresa Shelba SAUNDERS, NP  levonorgestrel  (KYLEENA ) 19.5 MG IUD 1 each by Intrauterine route once.    [provider]  methocarbamol  (ROBAXIN ) 500 MG tablet Take 1 tablet (500 mg total) by mouth every 8 (eight) hours as needed for muscle spasms. 06/15/20   Copland, Harlene BROCKS, MD  montelukast  (SINGULAIR ) 10 MG tablet TAKE 1 TABLET BY MOUTH EVERYDAY AT BEDTIME 11/03/23   Copland, Harlene BROCKS, MD  ondansetron  (ZOFRAN -ODT) 4 MG disintegrating tablet Take 1 tablet (4 mg total) by mouth every 6 (six) hours as needed for nausea or vomiting. 12/20/23   Tanda Locus, MD  oxyCODONE  (OXY IR/ROXICODONE ) 5 MG immediate release tablet Take 1 tablet (5 mg total) by mouth every 6 (six) hours as needed for breakthrough pain or severe pain (pain score 7-10). 12/20/23   Tanda Locus, MD  pantoprazole  (PROTONIX ) 40 MG tablet Take 1 tablet (40 mg total) by mouth daily. 12/20/23   Tanda Locus, MD  rizatriptan  (MAXALT ) 10 MG tablet Take 1 tablet (10 mg total) by mouth as needed for migraine. May repeat in 2 hours if needed. Max 30 mg in 24 hours 11/20/23   Copland, Harlene BROCKS, MD  sucralfate  (CARAFATE ) 1 GM/10ML suspension Take 10 mLs (1 g total) by mouth 4 (four) times daily -  with meals and at bedtime. 12/20/23   Tanda Locus, MD  topiramate  (TOPAMAX ) 50 MG tablet Take 1 tablet (50 mg total) by mouth at bedtime. 12/06/23  Copland, Harlene BROCKS, MD  venlafaxine  XR (EFFEXOR -XR) 150 MG 24 hr capsule Take 1 capsule (150 mg total) by mouth daily with breakfast. 04/19/23   Copland, Harlene BROCKS, MD    Allergies: Reglan  [metoclopramide ]    Review of Systems  Gastrointestinal:  Positive for constipation and vomiting.  All other systems reviewed and are negative.   Updated Vital Signs BP (!) 146/95   Pulse 92   Temp 98.7 F (37.1 C) (Oral)   Resp 16   SpO2 100%   Physical Exam Vitals and nursing note reviewed.  Constitutional:      General: She is not in acute distress.    Appearance: Normal appearance.  HENT:     Head:  Normocephalic and atraumatic.  Eyes:     General:        Right eye: No discharge.        Left eye: No discharge.  Cardiovascular:     Rate and Rhythm: Regular rhythm. Tachycardia present.     Heart sounds: No murmur heard.    No friction rub. No gallop.  Pulmonary:     Effort: Pulmonary effort is normal.     Breath sounds: Normal breath sounds.  Abdominal:     General: Bowel sounds are normal.     Palpations: Abdomen is soft.     Comments: Diffusely tender throughout abdomen, appropriately healing laparoscopic surgery sites noted.  No rebound, rigidity, guarding throughout.  Skin:    General: Skin is warm and dry.     Capillary Refill: Capillary refill takes less than 2 seconds.  Neurological:     Mental Status: She is alert and oriented to person, place, and time.  Psychiatric:        Mood and Affect: Mood normal.        Behavior: Behavior normal.     (all labs ordered are listed, but only abnormal results are displayed) Labs Reviewed  COMPREHENSIVE METABOLIC PANEL WITH GFR - Abnormal; Notable for the following components:      Result Value   Potassium 3.2 (*)    CO2 15 (*)    Anion gap 19 (*)    All other components within normal limits  CBC - Abnormal; Notable for the following components:   RBC 5.24 (*)    All other components within normal limits  URINALYSIS, ROUTINE W REFLEX MICROSCOPIC - Abnormal; Notable for the following components:   Color, Urine AMBER (*)    APPearance CLOUDY (*)    Ketones, ur 80 (*)    Protein, ur 100 (*)    Bacteria, UA RARE (*)    All other components within normal limits  LIPASE, BLOOD  HCG, SERUM, QUALITATIVE    EKG: None  Radiology: No results found.   Procedures   Medications Ordered in the ED  potassium chloride  10 mEq in 100 mL IVPB (has no administration in time range)  morphine  (PF) 4 MG/ML injection 4 mg (4 mg Intravenous Given 01/08/24 1228)  sodium chloride  0.9 % bolus 1,000 mL (1,000 mLs Intravenous New Bag/Given  01/08/24 1320)  ondansetron  (ZOFRAN ) injection 4 mg (4 mg Intravenous Given 01/08/24 1228)  iohexol  (OMNIPAQUE ) 300 MG/ML solution 100 mL (100 mLs Intravenous Contrast Given 01/08/24 1428)                                    Medical Decision Making Amount and/or Complexity of Data Reviewed Labs: ordered. Radiology:  ordered.  Risk Prescription drug management.   This patient is a 23 y.o. female  who presents to the ED for concern of abdominal pain, nausea, vomiting.   Differential diagnoses prior to evaluation: The emergent differential diagnosis includes, but is not limited to,  The causes of generalized abdominal pain include but are not limited to AAA, mesenteric ischemia, appendicitis, diverticulitis, DKA, gastritis, gastroenteritis, AMI, nephrolithiasis, pancreatitis, peritonitis, adrenal insufficiency,lead poisoning, iron toxicity, intestinal ischemia, constipation, UTI,SBO/LBO, splenic rupture, biliary disease, IBD, IBS, PUD, or hepatitis -- with high suspicion raised for bowel obstruction by her surgeon. This is not an exhaustive differential.   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 Pertinent results include: reviewed recent operative note after sleeve gastrectomy  Physical Exam: Physical exam performed. The pertinent findings include: Diffusely tender throughout abdomen, appropriately healing laparoscopic surgery sites noted.  No rebound, rigidity, guarding throughout.   Lab Tests/Imaging studies: I personally interpreted labs/imaging and the pertinent results include: UA with ketones, elevated anion gap, bicarb deficit, CO2 15, suspicious for dehydration, fluid bolus administered, she is hypokalemic, potassium 3.2. Ct pending at this time .   Medications: I ordered medication including morphine  for pain, zofran  for nausea, fluid bolus for  dehydration.  I have reviewed the patients home medicines and have made adjustments as needed.   3:11 PM Care of Kayla Hayes transferred to PA Lavanda Lesches and Dr. Lenor at the end of my shift as the patient will require reassessment once labs/imaging have resulted. Patient presentation, ED course, and plan of care discussed with review of all pertinent labs and imaging. Please see his/her note for further details regarding further ED course and disposition. Plan at time of handoff is pending CT, possible surgical consultation if any acute finding. This may be altered or completely changed at the discretion of the oncoming team pending results of further workup.    Final diagnoses:  None    ED Discharge Orders     None          Rosan Sherlean VEAR DEVONNA 01/08/24 1511    Levander Houston, MD 01/11/24 1151

## 2024-01-08 NOTE — ED Triage Notes (Signed)
 Patient presents due to possible bowel obstruction. Her surgeon wanted her to come because she has not had a bowel movement in 14 days. She recently had a gastric sleeve on 6/30. She reports abdominal tenderness, nausea and vomiting.

## 2024-01-08 NOTE — Discharge Instructions (Addendum)
 ### After Gastric Sleeve: ED Visit     Thank you for coming to the emergency department. This handout explains your recent visit, what was found, and what to do next.      **Why You Came to the Emergency Department**      You had abdominal pain, nausea, and stomach cramping about two weeks after your gastric sleeve surgery. These symptoms are common after this type of surgery, but it is important to check for serious problems, especially in the first month after surgery.[1][2][3][4][5]      **What Was Done**   - Labs which showed some dehydration    - You had a CT scan of your abdomen and pelvis. This is a special X-ray that helps doctors look for problems like leaks, blockages, or infections after surgery.[1][3][6]      - Your CT scan did **not** show any serious problems related to your surgery. This means there was no sign of a leak, blockage, or infection.      **Possible Reasons for Your Symptoms**      - After gastric sleeve surgery, it is common to have some stomach pain, nausea, or cramping, especially if you eat too quickly, eat the wrong foods, or do not chew well.[7]      - Sometimes, symptoms can happen even when all tests are normal. About 7% of people may have unexplained pain after bariatric surgery, but most improve with time and careful eating.[8]      - If you have ongoing or worsening symptoms, it is important to follow up with your bariatric surgery team.      **Other Findings on Your CT Scan**      Your scan showed a few things that were not related to your current symptoms, but are important to know about:      1. **Mild Fatty Liver (Steatosis):**      - This means there is some extra fat in your liver. This is common, especially in people who have had weight problems.[9][10]      - Most people with mild fatty liver do not have symptoms, but it can be a sign to keep working on healthy eating and weight loss.      - Your doctor may check your liver tests in the  future and may recommend follow-up if there are any concerns.[11][9][10]      2. 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 Sometimes this is found by accident on scans.      - AVN can cause hip pain or stiffness, but sometimes there are no symptoms at first. It can get worse over time, so your doctor may want to check this further, especially if you develop hip pain.[12][13]      3. **Inferiorly Displaced Intrauterine Device (IUD):**      - Your IUD (a type of birth control) is lower than usual in your uterus.      - A misplaced IUD can sometimes cause pain, bleeding, or may not work as well for birth control.[14][15]      - It is important to follow up with your gynecologist to check the IUD and decide if it needs to be moved or replaced.      **What to Watch For**      Go to the emergency department or call your doctor  right away if you have:      - Severe or worsening abdominal pain      - Fever or chills      - Fast heart rate      - Repeated vomiting and cannot keep fluids down      - Chest pain or trouble breathing      - New or severe hip pain      These could be signs of a serious problem after surgery.[1][2][3][4][5]      **Next Steps and Follow-Up**      - Follow your bariatric surgery team's instructions for eating and drinking. Eat slowly, chew well, and avoid foods that are not recommended after surgery.[11][7]      - Make an appointment with your gynecologist to check your IUD.      - If you develop hip pain, talk to your primary care doctor or an orthopedic specialist about the possible AVN.      - Keep working on healthy lifestyle changes to help your liver and overall health.      **Questions?**      If you have any questions or new symptoms, contact your healthcare team. It is important to keep all follow-up appointments to make sure  you recover well after your surgery.

## 2024-01-08 NOTE — ED Provider Notes (Signed)
 Patient received an handoff from PA prosperity.  In brief she recently had a gastric bypass that came in for nausea vomiting abdominal discomfort and concern for obstruction.  I have been advised to follow-up on CT imaging.  CT imaging returned I visualized and interpreted all results.  No evidence of obstruction fecal impaction or other acute abnormalities.  I reviewed these findings with the patient who became tearful stating that she feels really bad and cannot understand why there is nothing wrong.  I discussed the case with PA Vertell Pringle who will consult on the patient.   Patient seen at bedside by PA Simaan who has prescribed medications for pain control and a clinic follow-up along with IV fluids at their infusion center tomorrow.  Patient appears appropriate for discharge at this time.  I answered all questions to the best my ability.  Gust return precautions. Physical Exam  BP (!) 146/95   Pulse 92   Temp 98.7 F (37.1 C) (Oral)   Resp 16   SpO2 100%   Physical Exam Well-appearing No obvious rebound tenderness or involuntary guarding on examination.  Patient reports mild generalized tenderness without grimace or signs of severe pain. Procedures  Procedures  ED Course / MDM   Clinical Course as of 01/08/24 1532  Mon Jan 08, 2024  1516 Anion gap(!): 19 [AH]  1516 Ketones, ur(!): 80 [AH]    Clinical Course User Index [AH] Arloa Chroman, PA-C   Medical Decision Making Amount and/or Complexity of Data Reviewed Labs: ordered. Decision-making details documented in ED Course. Radiology: ordered.  Risk Prescription drug management.          Arloa Chroman, PA-C 01/08/24 1645    Lenor Hollering, MD 01/08/24 [id number]

## 2024-01-09 ENCOUNTER — Other Ambulatory Visit: Payer: Self-pay

## 2024-01-09 ENCOUNTER — Ambulatory Visit

## 2024-01-09 VITALS — BP 116/79 | HR 75 | Temp 97.9°F | Resp 16 | Ht 67.0 in | Wt 246.8 lb

## 2024-01-09 DIAGNOSIS — E860 Dehydration: Secondary | ICD-10-CM | POA: Diagnosis not present

## 2024-01-09 MED ORDER — SODIUM CHLORIDE 0.9 % IV BOLUS
1000.0000 mL | Freq: Once | INTRAVENOUS | Status: AC
  Administered 2024-01-09: 1000 mL via INTRAVENOUS
  Filled 2024-01-09: qty 1000

## 2024-01-09 NOTE — Progress Notes (Signed)
 Diagnosis: Dehydration  Provider:  Mannam, Praveen MD  Procedure: IV Infusion  IV Type: Peripheral, IV Location: R Hand  Normal Saline, Dose: 1000 ml  Infusion Start Time: 1416 pm  Infusion Stop Time: 1525 pm  Post Infusion IV Care: Observation period completed and Peripheral IV Discontinued  Discharge: Condition: Good, Destination: Home . AVS Declined  Performed by:  Trudy Lamarr LABOR, RN

## 2024-01-10 ENCOUNTER — Other Ambulatory Visit: Payer: Self-pay

## 2024-01-11 ENCOUNTER — Telehealth: Payer: Self-pay | Admitting: Dietician

## 2024-01-11 ENCOUNTER — Ambulatory Visit (HOSPITAL_COMMUNITY): Admitting: Licensed Clinical Social Worker

## 2024-01-11 NOTE — Telephone Encounter (Signed)
 RD called pt to verify fluid intake once starting soft, solid proteins 2 week post-bariatric surgery.   Daily Fluid intake:  Daily Protein intake:  Bowel Habits:   Concerns/issues:   Left Voice Message with call back number

## 2024-01-16 ENCOUNTER — Ambulatory Visit (INDEPENDENT_AMBULATORY_CARE_PROVIDER_SITE_OTHER)

## 2024-01-16 ENCOUNTER — Telehealth: Payer: Self-pay | Admitting: Dietician

## 2024-01-16 VITALS — BP 144/81 | HR 95 | Temp 97.6°F | Resp 18 | Ht 67.0 in | Wt 237.8 lb

## 2024-01-16 DIAGNOSIS — E860 Dehydration: Secondary | ICD-10-CM | POA: Diagnosis not present

## 2024-01-16 MED ORDER — SODIUM CHLORIDE 0.9 % IV BOLUS
1000.0000 mL | Freq: Once | INTRAVENOUS | Status: AC
  Administered 2024-01-16: 1000 mL via INTRAVENOUS
  Filled 2024-01-16: qty 1000

## 2024-01-16 NOTE — Progress Notes (Signed)
 Diagnosis: Dehydration  Provider:  Praveen Mannam MD  Procedure: IV Infusion  IV Type: Peripheral, IV Location: L Forearm  Normal Saline, Dose: 1000 ml  Infusion Start Time: 1128  Infusion Stop Time: 1305  Post Infusion IV Care: Patient declined observation and Peripheral IV Discontinued  Discharge: Condition: Good, Destination: Home . AVS Declined  Performed by:  Leita FORBES Miles, LPN

## 2024-01-16 NOTE — Telephone Encounter (Signed)
 RD called to follow-up with patient for alternate liquid protein options. Pt will see surgeon tomorrow.  Left Voice Message with call back number

## 2024-01-17 ENCOUNTER — Other Ambulatory Visit: Payer: Self-pay

## 2024-01-17 ENCOUNTER — Other Ambulatory Visit: Payer: Self-pay | Admitting: General Surgery

## 2024-01-17 ENCOUNTER — Inpatient Hospital Stay (HOSPITAL_COMMUNITY)
Admission: AD | Admit: 2024-01-17 | Discharge: 2024-01-24 | DRG: 392 | Disposition: A | Discharge status: Home Health | Source: Ambulatory Visit | Attending: General Surgery | Admitting: General Surgery

## 2024-01-17 ENCOUNTER — Encounter (HOSPITAL_COMMUNITY): Payer: Self-pay | Admitting: General Surgery

## 2024-01-17 ENCOUNTER — Encounter (HOSPITAL_COMMUNITY): Payer: Self-pay

## 2024-01-17 DIAGNOSIS — D6862 Lupus anticoagulant syndrome: Secondary | ICD-10-CM | POA: Diagnosis present

## 2024-01-17 DIAGNOSIS — I272 Pulmonary hypertension, unspecified: Secondary | ICD-10-CM | POA: Diagnosis present

## 2024-01-17 DIAGNOSIS — Z79899 Other long term (current) drug therapy: Secondary | ICD-10-CM | POA: Diagnosis not present

## 2024-01-17 DIAGNOSIS — Y848 Other medical procedures as the cause of abnormal reaction of the patient, or of later complication, without mention of misadventure at the time of the procedure: Secondary | ICD-10-CM | POA: Diagnosis present

## 2024-01-17 DIAGNOSIS — R112 Nausea with vomiting, unspecified: Secondary | ICD-10-CM | POA: Diagnosis present

## 2024-01-17 DIAGNOSIS — Z5986 Financial insecurity: Secondary | ICD-10-CM | POA: Diagnosis not present

## 2024-01-17 DIAGNOSIS — K5900 Constipation, unspecified: Secondary | ICD-10-CM | POA: Diagnosis present

## 2024-01-17 DIAGNOSIS — Z86711 Personal history of pulmonary embolism: Secondary | ICD-10-CM | POA: Diagnosis not present

## 2024-01-17 DIAGNOSIS — Z9884 Bariatric surgery status: Secondary | ICD-10-CM

## 2024-01-17 DIAGNOSIS — R1033 Periumbilical pain: Secondary | ICD-10-CM

## 2024-01-17 DIAGNOSIS — E860 Dehydration: Secondary | ICD-10-CM | POA: Diagnosis present

## 2024-01-17 DIAGNOSIS — Z86718 Personal history of other venous thrombosis and embolism: Secondary | ICD-10-CM | POA: Diagnosis not present

## 2024-01-17 DIAGNOSIS — Z6837 Body mass index (BMI) 37.0-37.9, adult: Secondary | ICD-10-CM | POA: Diagnosis not present

## 2024-01-17 DIAGNOSIS — Z8249 Family history of ischemic heart disease and other diseases of the circulatory system: Secondary | ICD-10-CM | POA: Diagnosis not present

## 2024-01-17 DIAGNOSIS — E876 Hypokalemia: Secondary | ICD-10-CM | POA: Diagnosis present

## 2024-01-17 DIAGNOSIS — E781 Pure hyperglyceridemia: Secondary | ICD-10-CM | POA: Diagnosis present

## 2024-01-17 DIAGNOSIS — J4520 Mild intermittent asthma, uncomplicated: Secondary | ICD-10-CM | POA: Diagnosis present

## 2024-01-17 DIAGNOSIS — R110 Nausea: Secondary | ICD-10-CM

## 2024-01-17 LAB — CBC
HCT: 42.9 % (ref 36.0–46.0)
Hemoglobin: 14.5 g/dL (ref 12.0–15.0)
MCH: 28.7 pg (ref 26.0–34.0)
MCHC: 33.8 g/dL (ref 30.0–36.0)
MCV: 84.8 fL (ref 80.0–100.0)
Platelets: 225 K/uL (ref 150–400)
RBC: 5.06 MIL/uL (ref 3.87–5.11)
RDW: 14.7 % (ref 11.5–15.5)
WBC: 5.3 K/uL (ref 4.0–10.5)
nRBC: 0 % (ref 0.0–0.2)

## 2024-01-17 LAB — COMPREHENSIVE METABOLIC PANEL WITH GFR
ALT: 23 U/L (ref 0–44)
AST: 18 U/L (ref 15–41)
Albumin: 3.8 g/dL (ref 3.5–5.0)
Alkaline Phosphatase: 62 U/L (ref 38–126)
Anion gap: 15 (ref 5–15)
BUN: 7 mg/dL (ref 6–20)
CO2: 17 mmol/L — ABNORMAL LOW (ref 22–32)
Calcium: 9.3 mg/dL (ref 8.9–10.3)
Chloride: 107 mmol/L (ref 98–111)
Creatinine, Ser: 0.61 mg/dL (ref 0.44–1.00)
GFR, Estimated: >60 mL/min (ref >60)
Glucose, Bld: 80 mg/dL (ref 70–99)
Potassium: 3.4 mmol/L — ABNORMAL LOW (ref 3.5–5.1)
Sodium: 139 mmol/L (ref 135–145)
Total Bilirubin: 1.2 mg/dL (ref 0.0–1.2)
Total Protein: 6.6 g/dL (ref 6.5–8.1)

## 2024-01-17 LAB — PREALBUMIN: Prealbumin: 23 mg/dL (ref 18–38)

## 2024-01-17 MED ORDER — VENLAFAXINE HCL ER 150 MG PO CP24
150.0000 mg | ORAL_CAPSULE | Freq: Every day | ORAL | Status: DC
  Administered 2024-01-17 – 2024-01-23 (×7): 150 mg via ORAL
  Filled 2024-01-17 (×7): qty 1

## 2024-01-17 MED ORDER — DICYCLOMINE HCL 10 MG/5ML PO SOLN
10.0000 mg | Freq: Three times a day (TID) | ORAL | Status: DC
  Administered 2024-01-17 – 2024-01-24 (×26): 10 mg via ORAL
  Filled 2024-01-17 (×28): qty 5

## 2024-01-17 MED ORDER — SODIUM CHLORIDE 0.9 % IV SOLN
12.5000 mg | Freq: Four times a day (QID) | INTRAVENOUS | Status: DC | PRN
  Filled 2024-01-17: qty 0.5

## 2024-01-17 MED ORDER — OXYCODONE HCL 5 MG/5ML PO SOLN
5.0000 mg | Freq: Four times a day (QID) | ORAL | Status: DC | PRN
  Administered 2024-01-17 – 2024-01-23 (×8): 5 mg via ORAL
  Filled 2024-01-17 (×10): qty 5

## 2024-01-17 MED ORDER — ENOXAPARIN SODIUM 30 MG/0.3ML IJ SOSY
30.0000 mg | PREFILLED_SYRINGE | Freq: Two times a day (BID) | INTRAMUSCULAR | Status: DC
  Administered 2024-01-17: 30 mg via SUBCUTANEOUS
  Filled 2024-01-17 (×2): qty 0.3

## 2024-01-17 MED ORDER — THIAMINE HCL 100 MG/ML IJ SOLN
100.0000 mg | Freq: Every day | INTRAMUSCULAR | Status: DC
  Administered 2024-01-18 – 2024-01-24 (×6): 100 mg via INTRAVENOUS
  Filled 2024-01-17 (×7): qty 2

## 2024-01-17 MED ORDER — SODIUM CHLORIDE 0.9% FLUSH
10.0000 mL | Freq: Two times a day (BID) | INTRAVENOUS | Status: DC
  Administered 2024-01-17 – 2024-01-22 (×5): 10 mL

## 2024-01-17 MED ORDER — SODIUM CHLORIDE 0.9% FLUSH: 10.0000 mL | INTRAVENOUS | Status: DC | PRN

## 2024-01-17 MED ORDER — KCL IN DEXTROSE-NACL 20-5-0.45 MEQ/L-%-% IV SOLN
INTRAVENOUS | Status: AC
  Filled 2024-01-17 (×4): qty 1000

## 2024-01-17 MED ORDER — PANTOPRAZOLE SODIUM 40 MG IV SOLR
40.0000 mg | Freq: Every day | INTRAVENOUS | Status: DC
  Administered 2024-01-17 – 2024-01-23 (×7): 40 mg via INTRAVENOUS
  Filled 2024-01-17 (×7): qty 10

## 2024-01-17 MED ORDER — PROMETHAZINE HCL 25 MG RE SUPP: 25.0000 mg | Freq: Four times a day (QID) | RECTAL | Status: DC | PRN

## 2024-01-17 MED ORDER — TOPIRAMATE 25 MG PO TABS
50.0000 mg | ORAL_TABLET | Freq: Every day | ORAL | Status: DC
  Administered 2024-01-17 – 2024-01-23 (×7): 50 mg via ORAL
  Filled 2024-01-17 (×7): qty 2

## 2024-01-17 MED ORDER — CHLORHEXIDINE GLUCONATE CLOTH 2 % EX PADS
6.0000 | MEDICATED_PAD | Freq: Every day | CUTANEOUS | Status: DC
  Administered 2024-01-18 – 2024-01-24 (×6): 6 via TOPICAL

## 2024-01-17 MED ORDER — ACETAMINOPHEN 160 MG/5ML PO SOLN
1000.0000 mg | Freq: Three times a day (TID) | ORAL | Status: DC
  Administered 2024-01-19 – 2024-01-21 (×3): 1000 mg via ORAL
  Filled 2024-01-17 (×3): qty 40.6

## 2024-01-17 MED ORDER — SIMETHICONE 80 MG PO CHEW
80.0000 mg | CHEWABLE_TABLET | Freq: Four times a day (QID) | ORAL | Status: DC | PRN
  Administered 2024-01-20 – 2024-01-21 (×2): 80 mg via ORAL
  Filled 2024-01-17 (×5): qty 1

## 2024-01-17 MED ORDER — ONDANSETRON 4 MG PO TBDP: 4.0000 mg | ORAL_TABLET | Freq: Four times a day (QID) | ORAL | Status: DC | PRN

## 2024-01-17 MED ORDER — ONDANSETRON 4 MG PO TBDP
4.0000 mg | ORAL_TABLET | Freq: Four times a day (QID) | ORAL | Status: DC | PRN
  Administered 2024-01-17: 4 mg via ORAL
  Filled 2024-01-17: qty 1

## 2024-01-17 MED ORDER — PROMETHAZINE HCL 25 MG PO TABS: 25.0000 mg | ORAL_TABLET | Freq: Four times a day (QID) | ORAL | Status: DC | PRN

## 2024-01-17 MED ORDER — ENSURE MAX PROTEIN PO LIQD: 2.0000 [oz_av] | ORAL | Status: DC

## 2024-01-17 MED ORDER — PROCHLORPERAZINE MALEATE 5 MG PO TABS: 5.0000 mg | ORAL_TABLET | Freq: Four times a day (QID) | ORAL | Status: DC | PRN

## 2024-01-17 MED ORDER — PROCHLORPERAZINE MALEATE 5 MG PO TABS
5.0000 mg | ORAL_TABLET | Freq: Four times a day (QID) | ORAL | Status: DC | PRN
  Administered 2024-01-17 – 2024-01-18 (×2): 5 mg via ORAL
  Filled 2024-01-17 (×4): qty 1

## 2024-01-17 MED ORDER — ACETAMINOPHEN 500 MG PO TABS
1000.0000 mg | ORAL_TABLET | Freq: Three times a day (TID) | ORAL | Status: DC
  Administered 2024-01-17 – 2024-01-24 (×18): 1000 mg via ORAL
  Filled 2024-01-17 (×19): qty 2

## 2024-01-17 MED ORDER — MORPHINE SULFATE (PF) 2 MG/ML IV SOLN
1.0000 mg | INTRAVENOUS | Status: DC | PRN
  Administered 2024-01-18 – 2024-01-19 (×2): 2 mg via INTRAVENOUS
  Filled 2024-01-17 (×2): qty 1

## 2024-01-17 MED ORDER — ONDANSETRON HCL 4 MG/2ML IJ SOLN
4.0000 mg | Freq: Four times a day (QID) | INTRAMUSCULAR | Status: DC | PRN
  Administered 2024-01-18 – 2024-01-19 (×5): 4 mg via INTRAVENOUS
  Filled 2024-01-17 (×6): qty 2

## 2024-01-17 NOTE — Plan of Care (Signed)

## 2024-01-17 NOTE — H&P (Deleted)
   The note originally documented on this encounter has been moved the the encounter in which it belongs.

## 2024-01-17 NOTE — Progress Notes (Signed)
 Peripherally Inserted Central Catheter Placement  The IV Nurse has discussed with the patient and/or persons authorized to consent for the patient, the purpose of this procedure and the potential benefits and risks involved with this procedure.  The benefits include less needle sticks, lab draws from the catheter, and the patient may be discharged home with the catheter. Risks include, but not limited to, infection, bleeding, blood clot (thrombus formation), and puncture of an artery; nerve damage and irregular heartbeat and possibility to perform a PICC exchange if needed/ordered by physician.  Alternatives to this procedure were also discussed.  Bard Power PICC patient education guide, fact sheet on infection prevention and patient information card has been provided to patient /or left at bedside.    PICC Placement Documentation  PICC Double Lumen 01/17/24 Right Basilic 41 cm 2 cm (Active)  Indication for Insertion or Continuance of Line Administration of hyperosmolar/irritating solutions (i.e. TPN, Vancomycin, etc.) 01/17/24 1900  Exposed Catheter (cm) 2 cm 01/17/24 1900  Site Assessment Clean, Dry, Intact 01/17/24 1900  Lumen #1 Status Flushed;Saline locked;Blood return noted 01/17/24 1900  Lumen #2 Status Flushed;Saline locked;Blood return noted 01/17/24 1900  Dressing Type Transparent;Securing device 01/17/24 1900  Dressing Status Antimicrobial disc/dressing in place 01/17/24 1900  Line Care Connections checked and tightened 01/17/24 1900  Line Adjustment (NICU/IV Team Only) No 01/17/24 1900  Dressing Intervention New dressing;Adhesive placed at insertion site (IV team only) 01/17/24 1900  Dressing Change Due 01/24/24 01/17/24 1900       Leita Shipper 01/17/2024, 7:33 PM

## 2024-01-17 NOTE — H&P (Signed)
 REFERRING PHYSICIAN:  Copland, Harlene Solan*  PROVIDER:  Carlean Crowl [name], MD  MRN: [id number] DOB: [birth year] DATE OF ENCOUNTER: 01/17/2024  Subjective     Chief Complaint: Follow-up (lap gastrectomy sleeve)     History of Present Illness: Kayla Hayes is a 23 y.o. female who is seen today as an office consultation at the request of Dr. Watt for evaluation of Follow-up (lap gastrectomy sleeve) .   History of Present Illness Kayla Hayes is a 23 year old female who presents with severe nausea and vomiting following a sleeve gastrectomy.  She underwent a sleeve gastrectomy on December 18, 2023, and has since experienced significant issues with oral intake, particularly in the past two weeks. She is unable to retain any food or drink, vomiting everything she consumes, including Gatorade and protein drinks, approximately 30 minutes post-ingestion. Water  is the only liquid she can sometimes tolerate, with a success rate of about 75%.  She was evaluated in the emergency room due to concerns of a bowel obstruction, but a CT scan showed no obstruction. The scan did reveal stool throughout her colon, leading to a prescription of Linzess, which she took for two days, resulting in bowel movements on subsequent days. Despite this, she continues to experience severe nausea and abdominal pain which is mid-abdominal in the midsections  Her abdominal pain is constant, located in the mid-abdominal region, and may get worse with movement or intake of anything other than water . No fever or chills are present. She reports occasional numbness in her legs, arms, and part of her face.  She has been unable to eat solid foods, with the exception of small amounts of applesauce, which sometimes also results in vomiting. She has not consumed anything substantial in six to seven days. Her current medications include Zofran  for nausea, which is effective only when taken with Tylenol , and Phenergan , which causes  drowsiness.  Patient has been sent for supplemental outpatient IV fluids several times.  SHe is accompanied by a family member today.  She has been taking her twice daily Lovenox  injections that she was prescribed on discharge for DVT prophylaxis     Review of Systems: A complete review of systems was obtained from the patient.  I have reviewed this information and discussed as appropriate with the patient.  See HPI as well for other ROS.  ROS    Medical History: Past Medical History:  Diagnosis Date   Anxiety    Asthma, unspecified asthma severity, unspecified whether complicated, unspecified whether persistent (HHS-HCC)    DVT (deep venous thrombosis) (CMS/HHS-HCC)    Lupus anticoagulant positive    Migraine without aura    Pulmonary embolism (CMS/HHS-HCC)    setting of COVID-19   Pulmonary hypertension (CMS/HHS-HCC)     Patient Active Problem List  Diagnosis   History of pulmonary embolism   Migraine    Past Surgical History:  Procedure Laterality Date   COLONOSCOPY  2022   LAPAROSCOPIC SLEEVE GASTRECTOMY  12/18/2023     No Known Allergies  Current Outpatient Medications on File Prior to Visit  Medication Sig Dispense Refill   albuterol  MDI, PROVENTIL , VENTOLIN , PROAIR , HFA 90 mcg/actuation inhaler TAKE 2 EACH (INHALATION) EVERY 3 TO 4 HOURS (WHEEZING) FOR 30 DAYS     ascorbic acid, vitamin C, (VITAMIN C) 500 MG tablet Take 500 mg by mouth once daily     cetirizine  (ZYRTEC ) 10 MG tablet Take 10 mg by mouth once daily     cholecalciferol (VITAMIN D3) 2,000  unit tablet Take 2,000 Units by mouth 2 (two) times daily 2 times daily     diclofenac  (VOLTAREN ) 75 MG EC tablet Take 75 mg by mouth 2 (two) times daily     ergocalciferol , vitamin D2, (VITAMIN D2 ORAL)      fluticasone  propionate (FLONASE ) 50 mcg/actuation nasal spray 1-2 spray in each nostril Nasally Once a day     ipratropium (ATROVENT ) 21 mcg (0.03 %) nasal spray Place 2 sprays into one nostril      levonorgestreL  (KYLEENA ) 17.5 mcg/24 hr (5 yrs) 19.5 mg IUD Insert 1 each into the uterus once     linaCLOtide (LINZESS) 145 mcg capsule Take 1 capsule (145 mcg total) by mouth once daily 20 capsule 0   montelukast  (SINGULAIR ) 10 mg tablet Take 10 mg by mouth at bedtime     NURTEC ODT 75 mg disintegrating tablet TAKE EVERY OTHER DAY FOR 2 WEEKS, THEN ONCE EVERY 24 HOURS AS NEEDED FOR HEADACHE     QULIPTA  60 mg Tab Take 1 tablet by mouth once daily     rizatriptan  (MAXALT ) 10 MG tablet TAKE 1 TABLET AS NEEDED FOR MIGRAINE. MAY REPEAT IN 2 HOURS IF NEEDED. MAX 30 MG IN 24 HOURS     sucralfate  (CARAFATE ) 100 mg/mL suspension Take 10 mLs (1 g total) by mouth 4 (four) times daily before meals and nightly for 15 days 414 mL 0   topiramate  (TOPAMAX ) 50 MG tablet Take 50 mg by mouth 2 (two) times daily     venlafaxine  (EFFEXOR -XR) 150 MG XR capsule Take 150 mg by mouth once daily     No current facility-administered medications on file prior to visit.    Family History  Problem Relation Age of Onset   High blood pressure (Hypertension) Mother    Obesity Mother    Hyperlipidemia (Elevated cholesterol) Father    High blood pressure (Hypertension) Father    Obesity Father      Social History   Tobacco Use  Smoking Status Never  Smokeless Tobacco Never     Social History   Socioeconomic History   Marital status: Single  Tobacco Use   Smoking status: Never   Smokeless tobacco: Never  Vaping Use   Vaping status: Unknown  Substance and Sexual Activity   Alcohol use: Yes   Drug use: Never   Social Drivers of Corporate investment banker Strain: Medium Risk (09/25/2023)   Received from [facility]   Overall Financial Resource Strain (CARDIA)    Difficulty of Paying Living Expenses: Somewhat hard  Food Insecurity: No Food Insecurity (12/18/2023)   Received from [facility]   Hunger Vital Sign    Within the past 12 months, you worried that your food would run out before you got the money  to buy more.: Never true    Within the past 12 months, the food you bought just didn't last and you didn't have money to get more.: Never true  Transportation Needs: No Transportation Needs (12/18/2023)   Received from [facility] - Transportation    In the past 12 months, has lack of transportation kept you from medical appointments or from getting medications?: No    In the past 12 months, has lack of transportation kept you from meetings, work, or from getting things needed for daily living?: No  Physical Activity: Sufficiently Active (09/25/2023)   Received from [facility]   Exercise Vital Sign    On average, how many days per week  do you engage in moderate to strenuous exercise (like a brisk walk)?: 3 days    On average, how many minutes do you engage in exercise at this level?: 50 min  Stress: Stress Concern Present (09/25/2023)   Received from [facility] - Occupational Stress Questionnaire    Feeling of Stress : To some extent  Social Connections: Socially Integrated (09/25/2023)   Received from [facility]   Social Connection and Isolation Panel    In a typical week, how many times do you talk on the phone with family, friends, or neighbors?: More than three times a week    How often do you get together with friends or relatives?: Once a week    How often do you attend church or religious services?: More than 4 times per year    Do you belong to any clubs or organizations such as church groups, unions, fraternal or athletic groups, or school groups?: Yes    How often do you attend meetings of the clubs or organizations you belong to?: More than 4 times per year    Are you married, widowed, divorced, separated, never married, or living with a partner?: Living with partner  Housing Stability: Unknown (12/08/2023)   Housing Stability Vital Sign    Homeless in the Last Year: No    Objective:    Vitals:   01/17/24 1054 01/17/24 1056  01/17/24 1201  BP: (!) 138/104  (!) 130/94  Pulse: 109    Temp: 36.3 C (97.4 F)    SpO2: 99%    Weight: (!) 108.8 kg (239 lb 12.8 oz)    Height: 170.2 cm (5' 7)    PainSc:    8   PainLoc:  Abdomen     Body mass index is 37.56 kg/m.  Constitutional: NAD; conversant; no deformities, severe obesity/ not ill; tearful Eyes: Moist conjunctiva; no lid lag; anicteric; PERRL Neck: Trachea midline; no thyromegaly Lungs: Normal respiratory effort; no tactile fremitus CV: RRR; no palpable thrills; no pitting edema GI: Abd soft, nd, essentially NT, incisions healed; no palpable hepatosplenomegaly MSK: Normal gait; no clubbing/cyanosis Psychiatric: Appropriate affect; alert and oriented x3 Lymphatic: No palpable cervical or axillary lymphadenopathy Skin:no rash    Labs, Imaging and Diagnostic Testing: Reviewed our office PA note 7/25 Reviewed ED CT scan 7/21 & ed notes   Assessment and Plan:     Diagnoses and all orders for this visit:  S/P laparoscopic sleeve gastrectomy  Periumbilical abdominal pain  Nausea  History of pulmonary embolism  Dehydration     Assessment & Plan Postoperative nausea, vomiting, and oral intolerance after sleeve gastrectomy Persistent nausea and vomiting with oral intolerance following sleeve gastrectomy on June 30th. Symptoms include inability to retain anything other than water , with frequent vomiting of protein drinks and Gatorade. Nausea is severe and persistent, with Zofran  providing limited relief only when taken with Tylenol . Differential includes mechanical issues post-surgery, but CT scan showed no signs of leak or obstruction. Discussion included the possibility of being an anomaly case with no quick fix, requiring a step-by-step workup to rule out complications and manage symptoms. - Schedule urgent upper GI study to assess for anatomical issues such as swelling or stricture. - Admit to hospital for direct admission to expedite workup and  management. - Insert PICC line for long-term IV access. - Initiate  IV fluids and TPN for nutrition and hydration with probable plans to continue as outpt. - Consider alternatives to Zofran   for nausea management.  Abdominal pain after sleeve gastrectomy Constant mid-abdominal pain since mid-July, exacerbated by movement and oral intake other than water . Pain is atypical post-sleeve gastrectomy and may be related to oral intolerance or other post-surgical complications. CT scan was reassuring with no signs of acute abdominal issues. - Admit to hospital for further evaluation and management of abdominal pain. - Perform upper GI study to assess for potential anatomical causes of pain. - Manage pain symptomatically while ruling out complications.  Constipation after bariatric surgery, improved Severe constipation post-surgery, with no bowel movement for 15 days. Linzess was effective in resolving constipation, with regular small bowel movements since initiation. Constipation is not currently a primary concern but may have contributed to abdominal discomfort. - Continue monitoring bowel movements and adjust treatment as necessary.  Severe obesity status post sleeve gastrectomy Status post sleeve gastrectomy performed on June 30th for management of severe obesity. Current complications include nausea, vomiting, and abdominal pain, impacting nutritional intake and recovery. - Focus on resolving current complications to facilitate recovery and weight management post-surgery. - Provide nutritional support through TPN until oral intake improves. This note has been created using automated tools and reviewed for accuracy by Sumedh Shinsato MCADAMS Tiago Humphrey.   This patient encounter took 30 minutes today to perform the following: take history, perform exam, review outside records, interpret imaging, counsel the patient on their diagnosis and document encounter, findings & plan in the EHR  No follow-ups on  file.  Sibbie Flammia [name], MD  General, Minimally Invasive, & Bariatric Surgery

## 2024-01-17 NOTE — Progress Notes (Signed)
     Name: Kayla Hayes                Patient MRN: [id number]  DOA: 01/17/2024   Patient seen in room 1303  Patient laying in bed. Family at bedside. Patient stated she's had a rough couple of weeks getting in oral fluid intake following discharge s/p sleeve gastrectomy with Dr. Tanda on 06/30.   Vital signs in last 24 hours:    01/17/2024    2:03 PM 01/16/2024    1:06 PM 01/16/2024   11:27 AM  Vitals with BMI  Height 5' 7  5' 7  Weight 237 lbs 13 oz  237 lbs 13 oz  BMI 37.24  37.24  Systolic 153 144 857  Diastolic 98 81 80  Pulse 101 95 110     Latest Lab Results:   Latest Reference Range & Units 01/08/24 12:27  Comprehensive metabolic panel with GFR  Rpt !  Sodium 135 - 145 mmol/L 141  Potassium 3.5 - 5.1 mmol/L 3.2 (L)  Chloride 98 - 111 mmol/L 107  CO2 22 - 32 mmol/L 15 (L)  Glucose 70 - 99 mg/dL 76  BUN 6 - 20 mg/dL 6  Creatinine 9.55 - 8.99 mg/dL 9.27  Calcium 8.9 - 89.6 mg/dL 9.6  Anion gap 5 - 15  19 (H)  Alkaline Phosphatase 38 - 126 U/L 66  Albumin 3.5 - 5.0 g/dL 4.4  Lipase 11 - 51 U/L 42  AST 15 - 41 U/L 28  ALT 0 - 44 U/L 32  Total Protein 6.5 - 8.1 g/dL 7.1  Total Bilirubin 0.0 - 1.2 mg/dL 1.1  GFR, Estimated >39 mL/min >60  WBC 4.0 - 10.5 K/uL 4.7  RBC 3.87 - 5.11 MIL/uL 5.24 (H)  Hemoglobin 12.0 - 15.0 g/dL 85.3  HCT 63.9 - 53.9 % 44.7  MCV 80.0 - 100.0 fL 85.3  MCH 26.0 - 34.0 pg 27.9  MCHC 30.0 - 36.0 g/dL 67.2  RDW 88.4 - 84.4 % 13.6  Platelets 150 - 400 K/uL 329  nRBC 0.0 - 0.2 % 0.0  !: Data is abnormal (L): Data is abnormally low (H): Data is abnormally high Rpt: View report in Results Review for more information   Provided support and encouragement. Will continue to review chart throughout patient encounter.     Thank you,  Roseann Medley, RN, MSN Bariatric Nurse Coordinator [phone] (office)

## 2024-01-18 ENCOUNTER — Inpatient Hospital Stay (HOSPITAL_COMMUNITY)

## 2024-01-18 LAB — BASIC METABOLIC PANEL WITH GFR
Anion gap: 11 (ref 5–15)
BUN: 5 mg/dL — ABNORMAL LOW (ref 6–20)
CO2: 20 mmol/L — ABNORMAL LOW (ref 22–32)
Calcium: 8.6 mg/dL — ABNORMAL LOW (ref 8.9–10.3)
Chloride: 105 mmol/L (ref 98–111)
Creatinine, Ser: 0.61 mg/dL (ref 0.44–1.00)
GFR, Estimated: >60 mL/min (ref >60)
Glucose, Bld: 108 mg/dL — ABNORMAL HIGH (ref 70–99)
Potassium: 2.6 mmol/L — CL (ref 3.5–5.1)
Sodium: 136 mmol/L (ref 135–145)

## 2024-01-18 LAB — MAGNESIUM: Magnesium: 1.8 mg/dL (ref 1.7–2.4)

## 2024-01-18 LAB — PHOSPHORUS: Phosphorus: 2.9 mg/dL (ref 2.5–4.6)

## 2024-01-18 MED ORDER — TRAVASOL 10 % IV SOLN
INTRAVENOUS | Status: AC
  Filled 2024-01-18: qty 595.2

## 2024-01-18 MED ORDER — KCL IN DEXTROSE-NACL 20-5-0.45 MEQ/L-%-% IV SOLN
INTRAVENOUS | Status: DC
  Filled 2024-01-18: qty 1000

## 2024-01-18 MED ORDER — POTASSIUM CHLORIDE 10 MEQ/100ML IV SOLN
10.0000 meq | INTRAVENOUS | Status: AC
  Administered 2024-01-18 (×6): 10 meq via INTRAVENOUS
  Filled 2024-01-18: qty 100

## 2024-01-18 MED ORDER — INSULIN ASPART 100 UNIT/ML IJ SOLN
0.0000 [IU] | Freq: Four times a day (QID) | INTRAMUSCULAR | Status: DC
  Administered 2024-01-19 – 2024-01-22 (×5): 1 [IU] via SUBCUTANEOUS

## 2024-01-18 MED ORDER — IOHEXOL 300 MG/ML  SOLN
150.0000 mL | Freq: Once | INTRAMUSCULAR | Status: AC | PRN
  Administered 2024-01-18: 25 mL via ORAL

## 2024-01-18 MED ORDER — ENOXAPARIN SODIUM 40 MG/0.4ML IJ SOSY
40.0000 mg | PREFILLED_SYRINGE | Freq: Two times a day (BID) | INTRAMUSCULAR | Status: DC
  Administered 2024-01-18 – 2024-01-24 (×12): 40 mg via SUBCUTANEOUS
  Filled 2024-01-18 (×12): qty 0.4

## 2024-01-18 NOTE — Progress Notes (Signed)
 PHARMACY - TOTAL PARENTERAL NUTRITION CONSULT NOTE   Indication: Intolerance to enteral feeding  Patient Measurements: Height: 5' 7 (170.2 cm) Weight: 107.9 kg (237 lb 12.8 oz) IBW/kg (Calculated) : 61.6 TPN AdjBW (KG): 73.2 Body mass index is 37.24 kg/m. 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  Pharmacy is consulted for TPN.  Surgeon is planning for likely prolonged use at home and requests cyclic TPN.   Glucose / Insulin : No hx DM.  Glucose 80.   Electrolytes: WNL except K 2.6, CO2 20 (improving), Ca slightly low.   Renal: SCr < 1, BUN low Hepatic: AST/ALT, AlkPhos, Tbili, Albumin all WNL - Hx hypertriglyceridemia 343 (04/19/23) Intake / Output; MIVF:  - mIVF:  D5-0.45% NaCl-KCl 20 at 125 ml/hr - Intake: PO 240 mL - Output: urine 600 mL GI Imaging: - 7/31 Upper GI series: Mild esophageal dysmotility with tertiary contractions.  GI Surgeries / Procedures:  6/30 laparoscopic sleeve gastrectomy   Central access: PICC 7/30 TPN start date: 7/31  Nutritional Goals: Goal TPN rate is 65 mL/hr provides 97 g of protein and 1466 kcals per day  RD Assessment:   7/31 Secure chat with RD: 1350-1550 kcals, 90-105 grams, >/= 1.5L fluid   Current Nutrition:  Bariatric Full liquids TPN  Plan:  Now:  KCl 10 mEq IV x6 runs   Start TPN at 40 mL/hr at 1800 Electrolytes in TPN: Na 24mEq/L, K 60mEq/L, Ca 24mEq/L, Mg 29mEq/L, and Phos 15mmol/L. Cl:Ac 1:2 Add standard MVI and trace elements to TPN Initiate Sensitive q6h SSI and adjust as needed  Reduce mIVF to 85 mL/hr at 1800  Monitor TPN labs on Mon/Thurs, and PRN.  Check TG with AM labs.   MD requests cyclic TPN.  Patient must have been on continuous TPN for one day without complications before cycling can be started.  Cycling will take at least 2 days.  If process to get to cycled TPN is not completed inpatient, it is  reasonable to discharge and for home health TPN agency to cycle TPN after discharge.     Wanda Hasting PharmD, BCPS [facility] main pharmacy [phone] 01/18/2024 8:22 AM

## 2024-01-18 NOTE — TOC Initial Note (Signed)
 Transition of Care [facility]) - Initial/Assessment Note    Patient Details  Name: Kayla Hayes MRN: [id number] Date of Birth: [birth year]  Transition of [facility] CM/SW Contact:    NORMAN ASPEN, LCSW Phone Number: 01/18/2024, 9:57 AM  Clinical Narrative:                  Met with pt and reviewed TOC order received to assist with arranging TPN for home.  Pt aware and agreeable with plan.  Has no agency preferences.  Referral placed with [facility] with liaison, Holley Herring, who is planning to be at the hospital this afternoon to complete teaching with pt and mother - pt aware/ RN aware as well.  TOC will continue to follow in case any additional  needs identified.  Expected Discharge Plan: Home w Home Health Services Barriers to Discharge: Continued Medical Work up   Patient Goals and CMS Choice Patient states their goals for this hospitalization and ongoing recovery are:: return home with s/o          Expected Discharge Plan and Services In-house Referral: Clinical Social Work   Post Acute Care Choice: Home Health Living arrangements for the past 2 months: Single Family Home                 DME Arranged: N/A DME Agency: NA       HH Arranged: IV Antibiotics, RN HH Agency: [facility] Date HH Agency Contacted: 01/18/24 Time HH Agency Contacted: 0900 Representative spoke with at [facility] Agency: Holley Herring, RN  Prior Living Arrangements/Services Living arrangements for the past 2 months: Single Family Home Lives with:: Significant Other Patient language and need for interpreter reviewed:: Yes Do you feel safe going back to the place where you live?: Yes      Need for Family Participation in Patient Care: Yes (Comment) Care giver support system in place?: Yes (comment)   Criminal Activity/Legal Involvement Pertinent to Current Situation/Hospitalization: No - Comment as needed  Activities of Daily Living   ADL Screening (condition at time of admission) Independently  performs ADLs?: Yes (appropriate for developmental age) Is the patient deaf or have difficulty hearing?: No Does the patient have difficulty seeing, even when wearing glasses/contacts?: No Does the patient have difficulty concentrating, remembering, or making decisions?: No  Permission Sought/Granted   Permission granted to share information with : Yes, Verbal Permission Granted  Share Information with NAME: s/o, Caron Hamilton or mother           Emotional Assessment Appearance:: Appears stated age Attitude/Demeanor/Rapport: Gracious, Engaged Affect (typically observed): Pleasant Orientation: : Oriented to Self, Oriented to Place, Oriented to  Time, Oriented to Situation Alcohol / Substance Use: Not Applicable Psych Involvement: No (comment)  Admission diagnosis:  Nausea & vomiting [R11.2] Patient Active Problem List   Diagnosis Date Noted   Nausea & vomiting 01/17/2024   Dehydration 12/21/2023   S/P laparoscopic sleeve gastrectomy 12/18/2023   Bilateral pulmonary embolism (HCC) 05/24/2019   COVID-19 virus infection 05/24/2019   Thrombocytopenia (HCC) 05/24/2019   Anxiety 11/29/2018   Asthma 08/08/2012   PCP:  Watt Harlene BROCKS, MD Pharmacy:   [facility] [phone] - [address], [place] - [address] [place] [address] Phone: [phone] Fax: [phone]  [place] [facility] [address] [place] [address] Phone: [phone] Fax: [phone]     Social Drivers of Health (SDOH) Social History: SDOH Screenings   Food Insecurity: No Food Insecurity (01/17/2024)  Housing: Low Risk  (01/17/2024)  Transportation Needs: No Transportation Needs (01/17/2024)  Utilities: Not At Risk (01/17/2024)  Alcohol Screen: Low Risk  (09/25/2023)  Depression (PHQ2-9): Medium Risk (11/15/2023)  Financial Resource Strain: Medium Risk (09/25/2023)  Physical Activity: Sufficiently Active (09/25/2023)  Social Connections: Socially  Integrated (09/25/2023)  Stress: Stress Concern Present (09/25/2023)  Tobacco Use: Low Risk  (01/17/2024)   SDOH Interventions:     Readmission Risk Interventions    01/18/2024    9:54 AM  Readmission Risk Prevention Plan  Post Dischage Appt Complete  Medication Screening Complete  Transportation Screening Complete

## 2024-01-18 NOTE — Plan of Care (Signed)
   Problem: Education: Goal: Knowledge of General Education information will improve Description: Including pain rating scale, medication(s)/side effects and non-pharmacologic comfort measures Outcome: Progressing   Problem: Activity: Goal: Risk for activity intolerance will decrease Outcome: Progressing

## 2024-01-18 NOTE — Progress Notes (Addendum)
   Subjective/Chief Complaint: Slept well after meds last night Some bubbling in abd Drank some water  Afraid to do shakes bc of recent issues with vomiting shakes   Objective: Vital signs in last 24 hours: Temp:  [97.8 F (36.6 C)-98.9 F (37.2 C)] 97.8 F (36.6 C) (07/31 0626) Pulse Rate:  [81-105] 105 (07/31 0626) Resp:  [16-18] 18 (07/31 0626) BP: (139-155)/(88-98) 152/92 (07/31 0626) SpO2:  [99 %-100 %] 100 % (07/31 0626) Weight:  [107.9 kg] 107.9 kg (07/30 1403) Last BM Date : 01/16/24  Intake/Output from previous day: 07/30 0701 - 07/31 0700 In: 933.2 [P.O.:240; I.V.:693.2] Out: 600 [Urine:600] Intake/Output this shift: No intake/output data recorded.  Asleep, easily awakens Nontoxic Nonlabored Reg Soft, nt, nd, incisions c/d/I/healed  Lab Results:  Recent Labs    01/17/24 1440  WBC 5.3  HGB 14.5  HCT 42.9  PLT 225   BMET Recent Labs    01/17/24 1440  NA 139  K 3.4*  CL 107  CO2 17*  GLUCOSE 80  BUN 7  CREATININE 0.61  CALCIUM 9.3   PT/INR No results for input(s): LABPROT, INR in the last 72 hours. ABG No results for input(s): PHART, HCO3 in the last 72 hours.  Invalid input(s): PCO2, PO2  Studies/Results: US  EKG SITE RITE Result Date: 01/17/2024 If Site Rite image not attached, placement could not be confirmed due to current cardiac rhythm.   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 No wbc. Previous CT showing no leak; will get upper GI today; etiology unclear - abdominal migraine? Somatic  symptoms?  Cont antiemetics as needed; added bentyl  on admission  H/o lupus anticoagulant/PE - bid lovenox   FEN - mild hypokalemia - potassium in IVF; prealbumin & albumin good but pt reports poor protein intake past 7-10 days; don't anticipate this improving in the next few days - will start TPN;  bari fulls as tolerated  Dispo- TOC consult for probable home TPN - would like to do cyclic TPN at home. Anticipate dc home in next 36-48hrs assuming upper GI normal   [name], MD, FACS General, Bariatric, & Minimally Invasive Surgery [facility],  A [facility]    LOS: 1 day    [name] 01/18/2024

## 2024-01-18 NOTE — Progress Notes (Signed)
 Patients potassium 2.6 Pharmacy and MD notified. New orders placed.

## 2024-01-18 NOTE — Plan of Care (Signed)
  Problem: Coping: Goal: Level of anxiety will decrease Outcome: Progressing   Problem: Elimination: Goal: Will not experience complications related to urinary retention Outcome: Completed/Met

## 2024-01-18 NOTE — Progress Notes (Signed)
 Initial Nutrition Assessment  DOCUMENTATION CODES:   Obesity unspecified  INTERVENTION:  - TPN to start tonight.   - TPN management per pharmacy.   - Monitor magnesium , potassium, and phosphorus daily for at least 3 days, MD to replete as needed, as pt is at risk for refeeding syndrome given significant weight loss and decreased oral intake x1 month with no oral intake x1 week. - Continue 100mg  thiamine  daily.   - Bariatric Full Liquid diet per Surgery.  - Ensure Max PO 2oz Q2H per surgery.    NUTRITION DIAGNOSIS:   Inadequate oral intake related to nausea, vomiting, altered GI function (s/p sleeve gastrectomy with inability to tolerate oral diet) as evidenced by energy intake < or equal to 50% for > or equal to 5 days.  GOAL:   Patient will meet greater than or equal to 90% of their needs  MONITOR:   PO intake, Labs, Weight trends  REASON FOR ASSESSMENT:   Consult New TPN/TNA  ASSESSMENT:   23 year old female who presented with severe nausea and vomiting following a sleeve gastrectomy (done December 18, 2023). She has since experienced significant issues with oral intake, particularly in the past two weeks. She is unable to retain any food or drink, vomiting everything she consumes.   7/30 Admit; PICC placed 7/31 TPN to be initiated  Patient in bed at time of visit. She reports her pre-surgery weight was 270# and she has since lost to most recent weight of 238#. This is a 32# or 12% weight loss over the past 1 month.   Patient shares that for the first 2 weeks after her surgery, she was doing well with eating. Notes that protein shakes never sat well on her stomach so she was mostly relying on yogurt for protein.   Beginning 2 weeks ago, patient shares she started to have nausea and abdominal pain. It progressively has worsened and over the past 1 week PTA she was not able to get/keep anything down other than water  and a few bites of applesauce.   Patient now admitted for  initiation of TPN. PICC placed yesterday and TPN to start tonight.   Discussed patient with pharmacist. Plan to start TPN tonight at 40mL/hr. Pharmacy also planning to decrease D5 to 62mL/hr, which will provide an additional 347 kcals. Goal TPN is 38mL/hr.   MD requesting cyclic TPN however patient must be on continuous TPN for at least once day with no issues before cyclic can be started.   Patient's infusion company will be [employer]. Plan for teaching this afternoon.  Surgery notes indicate patient may discharge in the next 48 hours. Suspect she may need to transition to cyclic after discharge, which [employer] can arrange.     Medications reviewed and include: Protonix , 100mg  thiamine , D5 @ 115mL/hr (provides 510 kcals over 24 hours), Phenergan  prn  Labs reviewed:  K+ 3.4   NUTRITION - FOCUSED PHYSICAL EXAM:  Flowsheet Row Most Recent Value  Orbital Region No depletion  Upper Arm Region No depletion  Thoracic and Lumbar Region No depletion  Buccal Region No depletion  Temple Region No depletion  Clavicle Bone Region No depletion  Clavicle and Acromion Bone Region No depletion  Scapular Bone Region Unable to assess  Dorsal Hand No depletion  Patellar Region No depletion  Anterior Thigh Region No depletion  Posterior Calf Region No depletion  Edema (RD Assessment) None  Hair Reviewed  Eyes Reviewed  Mouth Reviewed  Skin Reviewed  Nails Reviewed  Diet Order:   Diet Order             Diet bariatric full liquid Room service appropriate? Yes; Fluid consistency: Thin  Diet effective now                   EDUCATION NEEDS:  Education needs have been addressed  Skin:  Skin Assessment: Reviewed RN Assessment  Last BM:  7/29  Height:  Ht Readings from Last 1 Encounters:  01/17/24 5' 7 (1.702 m)   Weight:  Wt Readings from Last 1 Encounters:  01/17/24 107.9 kg   Ideal Body Weight:  61.36 kg  BMI:  Body mass index is 37.24 kg/m.  Estimated Nutritional  Needs:  Kcal:  1350-1550 kcals Protein:  90-105 grams Fluid:  >/= 1.5L    Kayla Hayes RD, LDN Contact via Secure Chat.

## 2024-01-19 LAB — COMPREHENSIVE METABOLIC PANEL WITH GFR
ALT: 26 U/L (ref 0–44)
AST: 24 U/L (ref 15–41)
Albumin: 3.4 g/dL — ABNORMAL LOW (ref 3.5–5.0)
Alkaline Phosphatase: 61 U/L (ref 38–126)
Anion gap: 7 (ref 5–15)
BUN: <5 mg/dL — ABNORMAL LOW (ref 6–20)
CO2: 22 mmol/L (ref 22–32)
Calcium: 8.6 mg/dL — ABNORMAL LOW (ref 8.9–10.3)
Chloride: 111 mmol/L (ref 98–111)
Creatinine, Ser: 0.5 mg/dL (ref 0.44–1.00)
GFR, Estimated: >60 mL/min (ref >60)
Glucose, Bld: 114 mg/dL — ABNORMAL HIGH (ref 70–99)
Potassium: 2.7 mmol/L — CL (ref 3.5–5.1)
Sodium: 140 mmol/L (ref 135–145)
Total Bilirubin: 0.9 mg/dL (ref 0.0–1.2)
Total Protein: 5.5 g/dL — ABNORMAL LOW (ref 6.5–8.1)

## 2024-01-19 LAB — GLUCOSE, CAPILLARY
Glucose-Capillary: 113 mg/dL — ABNORMAL HIGH (ref 70–99)
Glucose-Capillary: 115 mg/dL — ABNORMAL HIGH (ref 70–99)
Glucose-Capillary: 132 mg/dL — ABNORMAL HIGH (ref 70–99)
Glucose-Capillary: 117 mg/dL — ABNORMAL HIGH (ref 70–99)
Glucose-Capillary: 122 mg/dL — ABNORMAL HIGH (ref 70–99)

## 2024-01-19 LAB — POTASSIUM: Potassium: 3.3 mmol/L — ABNORMAL LOW (ref 3.5–5.1)

## 2024-01-19 LAB — TRIGLYCERIDES: Triglycerides: 192 mg/dL — ABNORMAL HIGH (ref <150)

## 2024-01-19 LAB — MAGNESIUM: Magnesium: 1.9 mg/dL (ref 1.7–2.4)

## 2024-01-19 LAB — PHOSPHORUS: Phosphorus: 2.4 mg/dL — ABNORMAL LOW (ref 2.5–4.6)

## 2024-01-19 MED ORDER — ATOGEPANT 60 MG PO TABS: 60.0000 mg | ORAL_TABLET | Freq: Every day | ORAL | Status: DC

## 2024-01-19 MED ORDER — SUCRALFATE 1 GM/10ML PO SUSP
1.0000 g | Freq: Three times a day (TID) | ORAL | Status: DC
  Administered 2024-01-19 (×2): 1 g via ORAL
  Filled 2024-01-19 (×6): qty 10

## 2024-01-19 MED ORDER — POTASSIUM CHLORIDE IN NACL 20-0.45 MEQ/L-% IV SOLN
INTRAVENOUS | Status: AC
  Filled 2024-01-19: qty 1000

## 2024-01-19 MED ORDER — POTASSIUM PHOSPHATES 15 MMOLE/5ML IV SOLN
15.0000 mmol | Freq: Once | INTRAVENOUS | Status: AC
  Administered 2024-01-19: 15 mmol via INTRAVENOUS
  Filled 2024-01-19: qty 5

## 2024-01-19 MED ORDER — MAGNESIUM SULFATE IN D5W 1-5 GM/100ML-% IV SOLN
1.0000 g | Freq: Once | INTRAVENOUS | Status: AC
  Administered 2024-01-19: 1 g via INTRAVENOUS
  Filled 2024-01-19: qty 100

## 2024-01-19 MED ORDER — POTASSIUM CHLORIDE 10 MEQ/100ML IV SOLN
10.0000 meq | INTRAVENOUS | Status: AC
  Administered 2024-01-19 (×3): 10 meq via INTRAVENOUS
  Filled 2024-01-19 (×3): qty 100

## 2024-01-19 MED ORDER — POTASSIUM CHLORIDE IN NACL 20-0.45 MEQ/L-% IV SOLN
INTRAVENOUS | Status: AC
  Filled 2024-01-19 (×2): qty 1000

## 2024-01-19 MED ORDER — MORPHINE SULFATE (PF) 2 MG/ML IV SOLN
1.0000 mg | INTRAVENOUS | Status: DC | PRN
  Administered 2024-01-21 – 2024-01-22 (×2): 1 mg via INTRAVENOUS
  Filled 2024-01-19 (×2): qty 1

## 2024-01-19 MED ORDER — SUMATRIPTAN SUCCINATE 50 MG PO TABS: 50.0000 mg | ORAL_TABLET | ORAL | Status: DC | PRN

## 2024-01-19 MED ORDER — RIMEGEPANT SULFATE 75 MG PO TBDP: 75.0000 mg | ORAL_TABLET | ORAL | Status: DC | PRN

## 2024-01-19 MED ORDER — TRAVASOL 10 % IV SOLN
INTRAVENOUS | Status: AC
  Filled 2024-01-19: qty 818.4

## 2024-01-19 NOTE — Progress Notes (Signed)
 Subjective/Chief Complaint: Slept well after meds last night Some having bubbling' in abd Drank some water  Still with nausea Discomfort about 20 min or so after trying to drink Chicken soup shake didn't go well   Objective: Vital signs in last 24 hours: Temp:  [98.1 F (36.7 C)-99 F (37.2 C)] 98.2 F (36.8 C) (08/01 0953) Pulse Rate:  [79-103] 89 (08/01 0953) Resp:  [14-18] 18 (08/01 0953) BP: (121-168)/(80-116) 155/90 (08/01 0953) SpO2:  [99 %-100 %] 100 % (08/01 0953) Last BM Date : 01/16/24  Intake/Output from previous day: 07/31 0701 - 08/01 0700 In: 3625.2 [P.O.:150; I.V.:2815.6; IV Piggyback:659.6] Out: 1400 [Urine:1400] Intake/Output this shift: Total I/O In: -  Out: 625 [Urine:625]  Awake, walking to bed.  Nontoxic Nonlabored Reg Soft, nt, nd, incisions c/d/I/healed  Lab Results:  Recent Labs    01/17/24 1440  WBC 5.3  HGB 14.5  HCT 42.9  PLT 225   BMET Recent Labs    01/18/24 1015 01/19/24 0238  NA 136 140  K 2.6* 2.7*  CL 105 111  CO2 20* 22  GLUCOSE 108* 114*  BUN 5* <5*  CREATININE 0.61 0.50  CALCIUM 8.6* 8.6*   PT/INR No results for input(s): LABPROT, INR in the last 72 hours. ABG No results for input(s): PHART, HCO3 in the last 72 hours.  Invalid input(s): PCO2, PO2  Studies/Results: DG UGI W SINGLE CM (SOL OR THIN BA) Result Date: 01/18/2024 CLINICAL DATA:  Provided history: Status post laparoscopic sleeve gastrectomy. Dehydration. Periumbilical abdominal pain. Nausea and vomiting. Additional history: Patient status post sleeve gastrectomy 12/18/2023 with readmission for nausea and vomiting, mid abdominal pain and subsequent poor oral intake. EXAM: UPPER GI SERIES WITH KUB TECHNIQUE: A scout radiograph of the abdomen was acquired. Subsequently, a water -soluble contrast upper GI series was performed. The exam was performed by [place], NP, and was supervised and interpreted by Dr. Rockey Childs. FLUOROSCOPY:  FLUOROSCOPY Radiation Exposure Index (as provided by the fluoroscopic device): 77.0 mGy Kerma COMPARISON:  CT abdomen/pelvis 01/08/2024. Upper GI series 06/19/2023. FINDINGS: The patient could only tolerate swallowing small volumes of contrast due to nausea. Within this limitation, findings are as follows. Nonobstructive bowel gas pattern on scout abdominal radiograph. Fluoroscopic evaluation demonstrates normal caliber and smooth contour of the esophagus. No evidence of a fixed stricture. 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 No appreciable hiatal hernia. No gastroesophageal reflux observed. Unremarkable appearance of the gastric remnant status post sleeve gastrectomy. Unremarkable appearance of the duodenal bulb and duodenal sweep. The patient swallowed a 13 mm barium tablet, which traversed the esophagus and passed into the stomach without delay. IMPRESSION: 1. The patient could only tolerate swallowing small volumes of contrast due to nausea. Within this limitation, findings are as follows. 2. Mild esophageal dysmotility with tertiary contractions. 3. Otherwise unremarkable examination status post sleeve gastrectomy, as described. Electronically Signed   By: Rockey Childs D.O.   On: 01/18/2024 10:04   US  EKG SITE RITE Result Date: 01/17/2024 If Site Rite image not attached, placement could not be confirmed due to current cardiac rhythm.   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 No  wbc. Previous CT showing no leak;  upper GI - ok; etiology  unclear - abdominal migraine? Somatic symptoms? Occasionally see this in bari pts; cont thiamine   Cont antiemetics as needed; added bentyl  on admission; add carafate  today  H/o lupus anticoagulant/PE - bid lovenox   FEN - severe hypokalemia - potassium in IVF; replacing potassium; hypophosphatemia - replace phosphate;; prealbumin & albumin good but pt reports poor protein intake past 7-10 days; don't anticipate this improving in the next few days - cont TPN;  bari fulls as tolerated; repeat labs in am  Dispo- TOC consult for home TPN - would like to do cyclic TPN at home. Anticipate dc home on Monday with TPN and once electroytes are stable  [name], MD, FACS General, Bariatric, & Minimally Invasive Surgery [facility],  [facility]    LOS: 2 days    [name] 01/19/2024

## 2024-01-19 NOTE — Progress Notes (Signed)
 PHARMACY - TOTAL PARENTERAL NUTRITION CONSULT NOTE   Indication: Intolerance to enteral feeding  Patient Measurements: Height: 5' 7 (170.2 cm) Weight: 107.9 kg (237 lb 12.8 oz) IBW/kg (Calculated) : 61.6 TPN AdjBW (KG): 73.2 Body mass index is 37.24 kg/m. 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  Pharmacy is consulted for TPN.  Surgeon is planning for likely prolonged use at home and requests cyclic TPN.   Glucose / Insulin : No hx DM - on sSSI q6h (has not used any in the past 24 hrs) - CBGs (goal <150): wnl    Electrolytes: K level at 2:38 AM -  low 2.7 (kcl 10 meq IV x3 ordered by MD this morning), phos low 2.4, Mag 1.9 --> Mag sulfate 1gm IV x1 per MD; other lytes wnl Renal: SCr < 1, BUN low Hepatic: AST/ALT, AlkPhos, Tbili - all WNL - albumin low 3.4  - Hx hypertriglyceridemia 343 (04/19/23) - TG: 192 (on 8/1) --> monitor closely for now  Intake / Output; MIVF: D5-0.45% NaCl-KCl 20 @ 85 ml/hr - I/O: +2225 mL - UOP: 1400 mL  GI Imaging: - 7/31 Upper GI series: Mild esophageal dysmotility with tertiary contractions.  GI Surgeries / Procedures:  - 6/30:  laparoscopic sleeve gastrectomy   Central access: PICC 7/30 TPN start date: 7/31  Nutritional Goals: Goal TPN rate is 70 mL/hr provides 104 g of protein and 1522 kcals per day  MD requests cyclic TPN.  Patient must have been on continuous TPN for one day without complications before cycling can be started.  Cycling will take at least 2 days.  If process to get to cycled TPN is not completed inpatient, it is reasonable to discharge and for home health TPN agency to cycle TPN after discharge.    RD Assessment: Estimated Needs Total Energy Estimated Needs: 1500-1650 kcals Total Protein Estimated Needs: 100-110 grams Total Fluid Estimated Needs: >/= 1.5L  Current Nutrition:  Bariatric Full liquids TPN  -  Thiamine  daily ordered by MD - ensure max 2 oz q2h --> pt's refusing  Plan:   Now: - Kphos 15 mmol IV x1 (contains 22 meq of K) - Last KCL run given at 0937 this morning. Check K level at noon today and replete as needed  - Ok with Dr. Tanda to change IVF to 0.45 with 20 meq/L KCL.   Today, at 1800:  - Will increase TPN rate slightly to 55 mL/hr - Electrolytes in TPN:  Na 36mEq/L K 50mEq/L Ca 5mEq/L Mg 79mEq/L Phos 15mmol/L Cl:Ac 1:2 - Add standard MVI and trace elements to TPN - Continue Sensitive q6h SSI and adjust as needed  - Will reduce mIVF to 70 mL/hr at 1800  - Thiamine  daily ordered by MD  - BMET daily, phos, and mag thru 01/21/24 - Triglyceride on 8/2 - Monitor TPN labs on Mon/Thurs, and PRN   Iantha Batch, PharmD, BCPS 01/19/2024 9:45 AM

## 2024-01-20 LAB — BASIC METABOLIC PANEL WITH GFR
Anion gap: 9 (ref 5–15)
BUN: <5 mg/dL — ABNORMAL LOW (ref 6–20)
CO2: 22 mmol/L (ref 22–32)
Calcium: 8.9 mg/dL (ref 8.9–10.3)
Chloride: 110 mmol/L (ref 98–111)
Creatinine, Ser: 0.49 mg/dL (ref 0.44–1.00)
GFR, Estimated: >60 mL/min (ref >60)
Glucose, Bld: 112 mg/dL — ABNORMAL HIGH (ref 70–99)
Potassium: 3.2 mmol/L — ABNORMAL LOW (ref 3.5–5.1)
Sodium: 141 mmol/L (ref 135–145)

## 2024-01-20 LAB — GLUCOSE, CAPILLARY
Glucose-Capillary: 115 mg/dL — ABNORMAL HIGH (ref 70–99)
Glucose-Capillary: 112 mg/dL — ABNORMAL HIGH (ref 70–99)
Glucose-Capillary: 116 mg/dL — ABNORMAL HIGH (ref 70–99)

## 2024-01-20 LAB — MAGNESIUM: Magnesium: 2.2 mg/dL (ref 1.7–2.4)

## 2024-01-20 LAB — PHOSPHORUS: Phosphorus: 2.6 mg/dL (ref 2.5–4.6)

## 2024-01-20 MED ORDER — POTASSIUM CHLORIDE 10 MEQ/100ML IV SOLN
10.0000 meq | INTRAVENOUS | Status: AC
  Administered 2024-01-20 (×4): 10 meq via INTRAVENOUS
  Filled 2024-01-20 (×4): qty 100

## 2024-01-20 MED ORDER — TRAVASOL 10 % IV SOLN
INTRAVENOUS | Status: AC
  Filled 2024-01-20: qty 1041.6

## 2024-01-20 MED ORDER — POTASSIUM PHOSPHATES 15 MMOLE/5ML IV SOLN
15.0000 mmol | Freq: Once | INTRAVENOUS | Status: AC
  Administered 2024-01-20: 15 mmol via INTRAVENOUS
  Filled 2024-01-20: qty 5

## 2024-01-20 MED ORDER — POTASSIUM CHLORIDE IN NACL 20-0.45 MEQ/L-% IV SOLN
INTRAVENOUS | Status: DC
  Filled 2024-01-20 (×4): qty 1000

## 2024-01-20 NOTE — Progress Notes (Signed)
   Subjective/Chief Complaint: Drank small amount water , nausea and ab pain persist   Objective: Vital signs in last 24 hours: Temp:  [97.7 F (36.5 C)-98.4 F (36.9 C)] 97.7 F (36.5 C) (08/02 0529) Pulse Rate:  [83-96] 83 (08/02 0529) Resp:  [15-19] 18 (08/02 0529) BP: (124-158)/(78-110) 158/99 (08/02 0529) SpO2:  [100 %] 100 % (08/02 0529) Last BM Date : 01/19/24  Intake/Output from previous day: 08/01 0701 - 08/02 0700 In: 2086 [P.O.:90; I.V.:1637.4; IV Piggyback:358.6] Out: 1450 [Urine:1450] Intake/Output this shift: No intake/output data recorded.  NAD Cv regular Pulm effort normal Ab soft nt, incisions clean  Lab Results:  Recent Labs    01/17/24 1440  WBC 5.3  HGB 14.5  HCT 42.9  PLT 225   BMET Recent Labs    01/19/24 0238 01/19/24 1420 01/20/24 0309  NA 140  --  141  K 2.7* 3.3* 3.2*  CL 111  --  110  CO2 22  --  22  GLUCOSE 114*  --  112*  BUN <5*  --  <5*  CREATININE 0.50  --  0.49  CALCIUM 8.6*  --  8.9   PT/INR No results for input(s): LABPROT, INR in the last 72 hours. ABG No results for input(s): PHART, HCO3 in the last 72 hours.  Invalid input(s): PCO2, PO2  Studies/Results: DG UGI W SINGLE CM (SOL OR THIN BA) Result Date: 01/18/2024 CLINICAL DATA:  Provided history: Status post laparoscopic sleeve gastrectomy. Dehydration. Periumbilical abdominal pain. Nausea and vomiting. Additional history: Patient status post sleeve gastrectomy 12/18/2023 with readmission for nausea and vomiting, mid abdominal pain and subsequent poor oral intake. EXAM: UPPER GI SERIES WITH KUB TECHNIQUE: A scout radiograph of the abdomen was acquired. Subsequently, a water -soluble contrast upper GI series was performed. The exam was performed by [place], NP, and was supervised and interpreted by Dr. Rockey Childs. FLUOROSCOPY: FLUOROSCOPY Radiation Exposure Index (as provided by the fluoroscopic device): 77.0 mGy Kerma COMPARISON:  CT  abdomen/pelvis 01/08/2024. Upper GI series 06/19/2023. FINDINGS: The patient could only tolerate swallowing small volumes of contrast due to nausea. Within this limitation, findings are as follows. Nonobstructive bowel gas pattern on scout abdominal radiograph. Fluoroscopic evaluation demonstrates normal caliber and smooth contour of the esophagus. No evidence of a fixed stricture. 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 No appreciable hiatal hernia. No gastroesophageal reflux observed. Unremarkable appearance of the gastric remnant status post sleeve gastrectomy. Unremarkable appearance of the duodenal bulb and duodenal sweep. The patient swallowed a 13 mm barium tablet, which traversed the esophagus and passed into the stomach without delay. IMPRESSION: 1. The patient could only tolerate swallowing small volumes of contrast due to nausea. Within this limitation, findings are as follows. 2. Mild esophageal dysmotility with tertiary contractions. 3. Otherwise unremarkable examination status post sleeve gastrectomy, as described. Electronically Signed   By: Rockey Childs D.O.   On: 01/18/2024 10:04    Anti-infectives: Anti-infectives (From admission, onward)    None       Assessment/Plan: Lap sleeve 6/30 with ab pain/n/v -replace potassium today again -bari fulls as tolerated -TPN -plan for home TPN and dc early this week -lovenox  -recheck labs in am     Donnice Bury 01/20/2024

## 2024-01-20 NOTE — Plan of Care (Signed)
  Problem: Education: Goal: Knowledge of General Education information will improve Description: Including pain rating scale, medication(s)/side effects and non-pharmacologic comfort measures Outcome: Progressing   Problem: Health Behavior/Discharge Planning: Goal: Ability to manage health-related needs will improve Outcome: Progressing   Problem: Clinical Measurements: Goal: Ability to maintain clinical measurements within normal limits will improve Outcome: Progressing Goal: Will remain free from infection Outcome: Progressing Goal: Diagnostic test results will improve Outcome: Progressing Goal: Respiratory complications will improve Outcome: Progressing Goal: Cardiovascular complication will be avoided Outcome: Progressing   Problem: Activity: Goal: Risk for activity intolerance will decrease Outcome: Progressing   Problem: Nutrition: Goal: Adequate nutrition will be maintained Outcome: Progressing   Problem: Coping: Goal: Level of anxiety will decrease Outcome: Progressing   Problem: Elimination: Goal: Will not experience complications related to bowel motility Outcome: Progressing   Problem: Pain Managment: Goal: General experience of comfort will improve and/or be controlled Outcome: Progressing   Problem: Safety: Goal: Ability to remain free from injury will improve Outcome: Progressing   Problem: Skin Integrity: Goal: Risk for impaired skin integrity will decrease Outcome: Progressing   Problem: Education: Goal: Ability to state signs and symptoms to report to health care provider will improve Outcome: Progressing Goal: Knowledge of the prescribed self-care regimen will improve Outcome: Progressing Goal: Knowledge of discharge needs will improve Outcome: Progressing   Problem: Activity: Goal: Ability to tolerate increased activity will improve Outcome: Progressing   Problem: Bowel/Gastric: Goal: Gastrointestinal status for postoperative course  will improve Outcome: Progressing Goal: Occurrences of nausea will decrease Outcome: Progressing   Problem: Coping: Goal: Development of coping mechanisms to deal with changes in body function or appearance will improve Outcome: Progressing   Problem: Fluid Volume: Goal: Maintenance of adequate hydration will improve Outcome: Progressing   Problem: Nutritional: Goal: Nutritional status will improve Outcome: Progressing   Problem: Clinical Measurements: Goal: Will show no signs or symptoms of venous thromboembolism Outcome: Progressing Goal: Will remain free from infection Outcome: Progressing Goal: Will show no signs of GI Leak Outcome: Progressing   Problem: Respiratory: Goal: Will regain and/or maintain adequate ventilation Outcome: Progressing   Problem: Pain Management: Goal: Pain level will decrease Outcome: Progressing   Problem: Skin Integrity: Goal: Demonstration of wound healing without infection will improve Outcome: Progressing   Problem: Education: Goal: Ability to describe self-care measures that may prevent or decrease complications (Diabetes Survival Skills Education) will improve Outcome: Progressing Goal: Individualized Educational Video(s) Outcome: Progressing   Problem: Coping: Goal: Ability to adjust to condition or change in health will improve Outcome: Progressing   Problem: Fluid Volume: Goal: Ability to maintain a balanced intake and output will improve Outcome: Progressing   Problem: Health Behavior/Discharge Planning: Goal: Ability to identify and utilize available resources and services will improve Outcome: Progressing Goal: Ability to manage health-related needs will improve Outcome: Progressing   Problem: Metabolic: Goal: Ability to maintain appropriate glucose levels will improve Outcome: Progressing   Problem: Nutritional: Goal: Maintenance of adequate nutrition will improve Outcome: Progressing Goal: Progress toward  achieving an optimal weight will improve Outcome: Progressing   Problem: Skin Integrity: Goal: Risk for impaired skin integrity will decrease Outcome: Progressing   Problem: Tissue Perfusion: Goal: Adequacy of tissue perfusion will improve Outcome: Progressing

## 2024-01-20 NOTE — Progress Notes (Signed)
 PHARMACY - TOTAL PARENTERAL NUTRITION CONSULT NOTE   Indication: Intolerance to enteral feeding  Patient Measurements: Height: 5' 7 (170.2 cm) Weight: 107.9 kg (237 lb 12.8 oz) IBW/kg (Calculated) : 61.6 TPN AdjBW (KG): 73.2 Body mass index is 37.24 kg/m. 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  Pharmacy is consulted for TPN.  Surgeon is planning for likely prolonged use at home and requests cyclic TPN.   Glucose / Insulin : No hx DM - on sSSI q6h (used 2 units in the past 24 hrs) - CBGs (goal <150): wnl    Electrolytes: K remains low 3.2 s/p repletion on 8/1, phos 2.6; other lytes wnl Renal: SCr < 1, BUN low Hepatic: AST/ALT, AlkPhos, Tbili - all WNL - albumin low 3.4  - Hx hypertriglyceridemia 343 (04/19/23) - TG: 192 (on 8/1) --> monitor closely for now  Intake / Output; MIVF: 0.45% NaCl-KCl 20 @ 70 ml/hr - I/O: +636 mL - UOP: 1450 mL  GI Imaging: - 7/31 Upper GI series: Mild esophageal dysmotility with tertiary contractions.  GI Surgeries / Procedures:  - 6/30:  laparoscopic sleeve gastrectomy   Central access: PICC 7/30 TPN start date: 7/31  Nutritional Goals: Goal TPN rate is 70 mL/hr provides 104 g of protein and 1522 kcals per day  MD requests cyclic TPN.  Patient must have been on continuous TPN for one day without complications before cycling can be started.  Cycling will take at least 2 days.  If process to get to cycled TPN is not completed inpatient, it is reasonable to discharge and for home health TPN agency to cycle TPN after discharge.    RD Assessment: Estimated Needs Total Energy Estimated Needs: 1500-1650 kcals Total Protein Estimated Needs: 100-110 grams Total Fluid Estimated Needs: >/= 1.5L  Current Nutrition:  Bariatric Full liquids TPN  - Thiamine  daily ordered by MD - ensure max 2 oz q2h --> pt's refusing  Plan:   Now: -  Kphos 15 mmol IV x1 (contains 22 meq of K) - potassium chloride  10 meq IV x 4 runs ordered by MD    Today, at 1800:  - Will increase TPN to goal rate 70 mL/hr - Electrolytes in TPN:  Na 40mEq/L K 50mEq/L Ca 38mEq/L Mg 20mEq/L Phos 15mmol/L Cl:Ac 1:2 - Add standard MVI and trace elements to TPN - Continue Sensitive q6h SSI and adjust as needed  - Will reduce mIVF to 50 mL/hr at 1800  - Thiamine  daily ordered by MD  - BMET daily, phos, and mag thru 01/21/24 - Triglyceride on 8/3 - Monitor TPN labs on Mon/Thurs, and PRN   Iantha Batch, PharmD, BCPS 01/20/2024 9:32 AM

## 2024-01-20 NOTE — Plan of Care (Signed)
  Problem: Clinical Measurements: Goal: Ability to maintain clinical measurements within normal limits will improve Outcome: Progressing   Problem: Activity: Goal: Risk for activity intolerance will decrease Outcome: Progressing   Problem: Pain Managment: Goal: General experience of comfort will improve and/or be controlled Outcome: Progressing   Problem: Safety: Goal: Ability to remain free from injury will improve Outcome: Progressing

## 2024-01-21 LAB — PHOSPHORUS: Phosphorus: 3.5 mg/dL (ref 2.5–4.6)

## 2024-01-21 LAB — GLUCOSE, CAPILLARY
Glucose-Capillary: 119 mg/dL — ABNORMAL HIGH (ref 70–99)
Glucose-Capillary: 120 mg/dL — ABNORMAL HIGH (ref 70–99)
Glucose-Capillary: 121 mg/dL — ABNORMAL HIGH (ref 70–99)
Glucose-Capillary: 130 mg/dL — ABNORMAL HIGH (ref 70–99)
Glucose-Capillary: 136 mg/dL — ABNORMAL HIGH (ref 70–99)

## 2024-01-21 LAB — BASIC METABOLIC PANEL WITH GFR
Anion gap: 7 (ref 5–15)
BUN: <5 mg/dL — ABNORMAL LOW (ref 6–20)
CO2: 21 mmol/L — ABNORMAL LOW (ref 22–32)
Calcium: 8.7 mg/dL — ABNORMAL LOW (ref 8.9–10.3)
Chloride: 111 mmol/L (ref 98–111)
Creatinine, Ser: 0.5 mg/dL (ref 0.44–1.00)
GFR, Estimated: >60 mL/min (ref >60)
Glucose, Bld: 124 mg/dL — ABNORMAL HIGH (ref 70–99)
Potassium: 3.3 mmol/L — ABNORMAL LOW (ref 3.5–5.1)
Sodium: 139 mmol/L (ref 135–145)

## 2024-01-21 LAB — TRIGLYCERIDES: Triglycerides: 232 mg/dL — ABNORMAL HIGH (ref <150)

## 2024-01-21 LAB — MAGNESIUM: Magnesium: 2 mg/dL (ref 1.7–2.4)

## 2024-01-21 MED ORDER — POTASSIUM CHLORIDE 10 MEQ/100ML IV SOLN
10.0000 meq | INTRAVENOUS | Status: DC
  Administered 2024-01-21: 10 meq via INTRAVENOUS
  Filled 2024-01-21 (×5): qty 100

## 2024-01-21 MED ORDER — POTASSIUM CHLORIDE 10 MEQ/100ML IV SOLN: 10.0000 meq | INTRAVENOUS | Status: DC

## 2024-01-21 MED ORDER — POTASSIUM CHLORIDE 10 MEQ/100ML IV SOLN
10.0000 meq | INTRAVENOUS | Status: AC
  Administered 2024-01-21 (×3): 10 meq via INTRAVENOUS

## 2024-01-21 MED ORDER — TRAVASOL 10 % IV SOLN
INTRAVENOUS | Status: DC
  Filled 2024-01-21: qty 1041.6

## 2024-01-21 NOTE — Progress Notes (Signed)
   Subjective/Chief Complaint: unchanged   Objective: Vital signs in last 24 hours: Temp:  [98.1 F (36.7 C)-98.9 F (37.2 C)] 98.1 F (36.7 C) (08/03 0557) Pulse Rate:  [70-81] 81 (08/03 0557) Resp:  [18] 18 (08/03 0557) BP: (116-136)/(68-97) 116/68 (08/03 0557) SpO2:  [100 %] 100 % (08/03 0557) Last BM Date : 01/19/24  Intake/Output from previous day: 08/02 0701 - 08/03 0700 In: 1896.9 [P.O.:120; I.V.:1191; IV Piggyback:585.9] Out: 0  Intake/Output this shift: No intake/output data recorded.  NAD Cv regular Pulm effort normal Ab soft nt, incisions clean  Lab Results:  No results for input(s): WBC, HGB, HCT, PLT in the last 72 hours. BMET Recent Labs    01/20/24 0309 01/21/24 0204  NA 141 139  K 3.2* 3.3*  CL 110 111  CO2 22 21*  GLUCOSE 112* 124*  BUN <5* <5*  CREATININE 0.49 0.50  CALCIUM 8.9 8.7*   PT/INR No results for input(s): LABPROT, INR in the last 72 hours. ABG No results for input(s): PHART, HCO3 in the last 72 hours.  Invalid input(s): PCO2, PO2  Studies/Results: No results found.  Anti-infectives: Anti-infectives (From admission, onward)    None       Assessment/Plan: Lap sleeve 6/30 with ab pain/n/v -replace potassium today again -bari fulls as tolerated -TPN -plan for home TPN and dc early this week -lovenox  -recheck labs in am  Donnice Bury 01/21/2024

## 2024-01-21 NOTE — Progress Notes (Signed)
 PHARMACY - TOTAL PARENTERAL NUTRITION CONSULT NOTE   Indication: Intolerance to enteral feeding  Patient Measurements: Height: 5' 7 (170.2 cm) Weight: 107.9 kg (237 lb 12.8 oz) IBW/kg (Calculated) : 61.6 TPN AdjBW (KG): 73.2 Body mass index is 37.24 kg/m. 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  Pharmacy is consulted for TPN.  Surgeon is planning for likely prolonged use at home and requests cyclic TPN.   Glucose / Insulin : No hx DM - on sSSI q6h (has not used any in the past 24 hrs) - CBGs (goal <150): wnl    Electrolytes: K remains low at 3.3 (despite 4 runs of KCL and 22 meq of KCL in Kphos on 8/2 and KCL in IVF), CO2 slightly low at 21;  other lytes wnl Renal: SCr < 1, BUN low Hepatic: AST/ALT, AlkPhos, Tbili - all WNL - albumin low 3.4  - Hx hypertriglyceridemia 343 (04/19/23) - TG: 192 (on 8/1), 232 (on 8/3) --> monitor closely for now  Intake / Output; MIVF: 0.45% NaCl-KCl 20 @ 50 ml/hr - I/O: +1897 mL - UOP:  documented as 7x  - last BM: 8/2 GI Imaging: - 7/31 Upper GI series: Mild esophageal dysmotility with tertiary contractions.  GI Surgeries / Procedures:  - 6/30:  laparoscopic sleeve gastrectomy   Central access: PICC 7/30 TPN start date: 7/31  Nutritional Goals: Goal TPN rate is 70 mL/hr provides 104 g of protein and 1522 kcals per day  MD requests cyclic TPN.  Patient must have been on continuous TPN for one day without complications before cycling can be started.  Cycling will take at least 2 days.  If process to get to cycled TPN is not completed inpatient, it is reasonable to discharge and for home health TPN agency to cycle TPN after discharge.    RD Assessment: Estimated Needs Total Energy Estimated Needs: 1500-1650 kcals Total Protein Estimated Needs: 100-110 grams Total Fluid Estimated Needs: >/= 1.5L  Current Nutrition:  Bariatric  Full liquids TPN  - Thiamine  daily ordered by MD - ensure max 2 oz q2h --> pt's refusing  Plan:   Now: - potassium chloride  10 meq IV x 4 runs ordered by MD    Today, at 1800:  - Continue TPN at goal rate 70 mL/hr Will plan on transitioning to cyclic TPN on 01/22/24 if patient remains stable - Electrolytes in TPN:  Na 4mEq/L Increase K to 60mEq/L Ca 5mEq/L Mg 44mEq/L Phos 15mmol/L Cl:Ac --> max Ac - Add standard MVI and trace elements to TPN - Continue Sensitive q6h SSI and adjust as needed  - Continue mIVF to 50 mL/hr at 1800  - Thiamine  daily ordered by MD  - Triglyceride on 8/4 - Monitor TPN labs on Mon/Thurs, and PRN   Iantha Batch, PharmD, BCPS 01/21/2024 8:17 AM

## 2024-01-21 NOTE — Plan of Care (Signed)
   Problem: Education: Goal: Knowledge of General Education information will improve Description Including pain rating scale, medication(s)/side effects and non-pharmacologic comfort measures Outcome: Progressing   Problem: Clinical Measurements: Goal: Ability to maintain clinical measurements within normal limits will improve Outcome: Progressing   Problem: Activity: Goal: Risk for activity intolerance will decrease Outcome: Progressing

## 2024-01-22 LAB — COMPREHENSIVE METABOLIC PANEL WITH GFR
ALT: 76 U/L — ABNORMAL HIGH (ref 0–44)
AST: 89 U/L — ABNORMAL HIGH (ref 15–41)
Albumin: 3 g/dL — ABNORMAL LOW (ref 3.5–5.0)
Alkaline Phosphatase: 49 U/L (ref 38–126)
Anion gap: 10 (ref 5–15)
BUN: 6 mg/dL (ref 6–20)
CO2: 22 mmol/L (ref 22–32)
Calcium: 8.8 mg/dL — ABNORMAL LOW (ref 8.9–10.3)
Chloride: 107 mmol/L (ref 98–111)
Creatinine, Ser: 0.41 mg/dL — ABNORMAL LOW (ref 0.44–1.00)
GFR, Estimated: >60 mL/min (ref >60)
Glucose, Bld: 126 mg/dL — ABNORMAL HIGH (ref 70–99)
Potassium: 3.6 mmol/L (ref 3.5–5.1)
Sodium: 139 mmol/L (ref 135–145)
Total Bilirubin: 0.3 mg/dL (ref 0.0–1.2)
Total Protein: 5.3 g/dL — ABNORMAL LOW (ref 6.5–8.1)

## 2024-01-22 LAB — GLUCOSE, CAPILLARY
Glucose-Capillary: 109 mg/dL — ABNORMAL HIGH (ref 70–99)
Glucose-Capillary: 125 mg/dL — ABNORMAL HIGH (ref 70–99)
Glucose-Capillary: 125 mg/dL — ABNORMAL HIGH (ref 70–99)
Glucose-Capillary: 132 mg/dL — ABNORMAL HIGH (ref 70–99)

## 2024-01-22 LAB — MAGNESIUM: Magnesium: 2.1 mg/dL (ref 1.7–2.4)

## 2024-01-22 LAB — TRIGLYCERIDES: Triglycerides: 290 mg/dL — ABNORMAL HIGH (ref <150)

## 2024-01-22 LAB — PHOSPHORUS: Phosphorus: 3.8 mg/dL (ref 2.5–4.6)

## 2024-01-22 MED ORDER — POTASSIUM CHLORIDE 10 MEQ/50ML IV SOLN
10.0000 meq | INTRAVENOUS | Status: AC
  Administered 2024-01-22 (×2): 10 meq via INTRAVENOUS
  Filled 2024-01-22 (×2): qty 50

## 2024-01-22 MED ORDER — INSULIN ASPART 100 UNIT/ML IJ SOLN
0.0000 [IU] | Freq: Four times a day (QID) | INTRAMUSCULAR | Status: AC
  Administered 2024-01-22: 1 [IU] via SUBCUTANEOUS

## 2024-01-22 MED ORDER — LORAZEPAM 2 MG/ML PO CONC: 1.0000 mg | ORAL | Status: AC

## 2024-01-22 MED ORDER — DEXTROSE 10 % IV SOLN: INTRAVENOUS | Status: AC

## 2024-01-22 MED ORDER — POTASSIUM CHLORIDE 10 MEQ/50ML IV SOLN
10.0000 meq | INTRAVENOUS | Status: AC
  Administered 2024-01-22 (×4): 10 meq via INTRAVENOUS
  Filled 2024-01-22 (×4): qty 50

## 2024-01-22 MED ORDER — TRAVASOL 10 % IV SOLN
INTRAVENOUS | Status: DC
  Filled 2024-01-22: qty 1008

## 2024-01-22 MED ORDER — INSULIN ASPART 100 UNIT/ML IJ SOLN: 0.0000 [IU] | INTRAMUSCULAR | Status: DC

## 2024-01-22 NOTE — Progress Notes (Addendum)
 PHARMACY - TOTAL PARENTERAL NUTRITION CONSULT NOTE   Indication: Intolerance to enteral feeding  Patient Measurements: Height: 5' 7 (170.2 cm) Weight: 107.9 kg (237 lb 12.8 oz) IBW/kg (Calculated) : 61.6 TPN AdjBW (KG): 73.2 Body mass index is 37.24 kg/m. Usual Weight:   Assessment: 72 yoF s/p laparoscopic sleeve gastrectomy 12/18/23, and re-admitted on 7/30 with N/V, abdominal pain, and poor oral intake.  Pharmacy is consulted for TPN.  Surgeon is planning for likely prolonged use at home and requests cyclic TPN.   Glucose / Insulin : No hx DM -Goal CBG: <150. sSSI q6h (3 units/24 hours)   Electrolytes: All WNL, including CorrCa (9.6) -K (3.6) on lower end of normal Renal: SCr < 1, BUN WNL Hepatic: Alk Phos, T.bili WNL. Alb remains low. -TG (290) elevated and trending up. Hx hypertriglyceridemia 343 (on 04/19/23) -AST/ALT slightly elevated Intake / Output; MIVF: Strict I/O not charted -mIVF: 0.45% NaCl-KCl 20 @ 50 ml/hr per MD - UOP:  x3 unmeasured - last BM: 8/2 GI Imaging: - 7/31 Upper GI series: Mild esophageal dysmotility with tertiary contractions.  -8/4 HIDA ordered GI Surgeries / Procedures:  - 6/30:  laparoscopic sleeve gastrectomy   Central access: Double lumen PICC placed 7/30 TPN start date: 7/31  Nutritional Goals: Goal TPN rate is 70 mL/hr provides 104 g of protein and 1522 kcals per day  MD requests cyclic TPN.  Patient must have been on continuous TPN for one day without complications before cycling can be started.  Cycling will take at least 2 days.  If process to get to cycled TPN is not completed inpatient, it is reasonable to discharge and for home health TPN agency to cycle TPN after discharge.    RD Assessment: Estimated Needs Total Energy Estimated Needs: 1500-1650 kcals Total Protein Estimated Needs: 100-110 grams Total Fluid Estimated Needs: >/= 1.5L  Current Nutrition:  -NPO except for sips with meds for HIDA -Continuous TPN at goal rate -  ensure max 2 oz q2h - pt has refused all doses  Plan:   Now:  KCl 10 mEq x2   Today, at 1800:  Transition TPN from continuous to cyclic Cycle over 18 hours with 1 hour ramp-up and ramp-down Will provide 101 g protein, 1510 kcal Electrolytes in TPN: No changes Na 50 mEq/L K to 60 mEq/L Ca 60mEq/L Mg 86mEq/L Phos 15mmol/L Cl:Ac  = 1:1 Add standard MVI and trace elements to TPN Continue sSSI, but adjust times based on TPN cycle 2 hours after start of TPN: 20:00 1 hour after TPN stopped: 13:00 During TPN infusion: 06:00 While off of TPN: 16:00 Continue mIVF at 50 mL/hr per MD. Confirmed Dr. Tanda wants to continue this in addition to TPN. Thiamine  100 mg IV daily daily ordered by MD. Monitor TPN labs on Mon/Thurs. Recheck electrolytes with AM labs tomorrow.   Ronal CHRISTELLA Rav, PharmD 01/22/24 9:49 AM  Addendum:  I was added to a secure chat between RN, RT, and surgeon at 11:00 am regarding TPN needing to be stopped 4 hours prior to HIDA scan. After discussion of scheduling, decision made to stop TPN (order discontinued at 11:09) for HIDA today. Subsequently the HIDA was rescheduled for 8/5. I confirmed with RN that TPN had never been paused/interrupted/disconnected today. Since TPN was never interrupted/disconnected, TPN to infuse until 18:00 this evening.   Verbal order received from Dr. Tanda to stop TPN on 8/5 @ 00:00 for HIDA tomorrow. Will d/c cyclic TPN with required ramp up/ramp down and infuse D10 @  70 mL/hr from 18:00 - midnight. Will give 4 runs KCl this evening and recheck electrolytes with AM labs tomorrow. Plan d/w MD.   Will plan to resume TPN on 8/5 post HIDA.   Ronal CHRISTELLA Rav, PharmD 01/22/24 1:53 PM

## 2024-01-22 NOTE — Progress Notes (Signed)
 VAST RN consulted to stop TPN and flush PICC for HIDA scan earlier today. Upon arrival to patient's room, unit RN notified patient and VAST RN that HIDA scan canceled for today. TPN left to infuse at ordered rate.  This evening, VAST RN spoke with pharmacist who stated that cyclic TPN had originally been ordered, but was canceled for HIDA scan for tomorrow. Unit RN to hang D10 and TPN to be discontinued.

## 2024-01-22 NOTE — Plan of Care (Signed)
   Problem: Health Behavior/Discharge Planning: Goal: Ability to manage health-related needs will improve Outcome: Progressing   Problem: Clinical Measurements: Goal: Will remain free from infection Outcome: Progressing   Problem: Coping: Goal: Level of anxiety will decrease Outcome: Progressing

## 2024-01-22 NOTE — Progress Notes (Signed)
   Subjective/Chief Complaint: Uneventful weekend.  Nausea has improved.  She walked around in the room.  Walked in the halls about once a day.  Still with abdominal pain.  She states it is now on the left side mainly in the mid to lower abdomen.  She has been having some stools though they are loose.  She states 3-4 stools per day.  She has been afraid to eat or drink really much anything she states.  States that sometimes physical activity or movement worsens the abdominal pain.  Unsure if oral intake worsens it since she has not really drank or tried to eat anything over the weekend  Did not require a lot of of opioids yesterday   Objective: Vital signs in last 24 hours: Temp:  [98.1 F (36.7 C)-98.7 F (37.1 C)] 98.1 F (36.7 C) (08/04 0552) Pulse Rate:  [60-102] 89 (08/04 0552) Resp:  [18-19] 18 (08/04 0552) BP: (130-148)/(72-85) 131/72 (08/04 0552) SpO2:  [99 %-100 %] 99 % (08/04 0552) Last BM Date : 01/21/24  Intake/Output from previous day: 08/03 0701 - 08/04 0700 In: 3005.3 [I.V.:2655.2; IV Piggyback:350.2] Out: -  Intake/Output this shift: No intake/output data recorded.  Asleep easily awakens, nontoxic Nontoxic Nonlabored Reg Soft, nt, nd, incisions c/d/I/healed  Lab Results:  No results for input(s): WBC, HGB, HCT, PLT in the last 72 hours.  BMET Recent Labs    01/21/24 0204 01/22/24 0323  NA 139 139  K 3.3* 3.6  CL 111 107  CO2 21* 22  GLUCOSE 124* 126*  BUN <5* 6  CREATININE 0.50 0.41*  CALCIUM 8.7* 8.8*   PT/INR No results for input(s): LABPROT, INR in the last 72 hours. ABG No results for input(s): PHART, HCO3 in the last 72 hours.  Invalid input(s): PCO2, PO2  Studies/Results: No results found.   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 No wbc. Previous CT showing no leak;  upper GI - ok; etiology unclear - abdominal migraine? Somatic symptoms? Occasionally see this in bari pts; cont thiamine ; will order HIDA scan today with ejection fraction to see if this is some type of biliary dyskinesia.  Cont antiemetics as needed; added bentyl  on admission; add carafate  today  H/o lupus anticoagulant/PE - bid lovenox   FEN -electrolyte abnormalities resolved- cont TPN;  bari fulls as tolerated; repeat labs in am  Dispo- TOC consult for home TPN - would like to do cyclic TPN at home. Anticipate dc home on Tuesday with TPN and assuming HIDA scan is unremarkable  [name], MD, FACS General, Bariatric, & Minimally Invasive Surgery [facility],  A [facility]    LOS: 5 days    [name] 01/22/2024

## 2024-01-23 ENCOUNTER — Inpatient Hospital Stay (HOSPITAL_COMMUNITY)

## 2024-01-23 LAB — GLUCOSE, CAPILLARY
Glucose-Capillary: 85 mg/dL (ref 70–99)
Glucose-Capillary: 84 mg/dL (ref 70–99)
Glucose-Capillary: 64 mg/dL — ABNORMAL LOW (ref 70–99)
Glucose-Capillary: 93 mg/dL (ref 70–99)
Glucose-Capillary: 111 mg/dL — ABNORMAL HIGH (ref 70–99)

## 2024-01-23 LAB — BASIC METABOLIC PANEL WITH GFR
Anion gap: 9 (ref 5–15)
BUN: 6 mg/dL (ref 6–20)
CO2: 22 mmol/L (ref 22–32)
Calcium: 9.2 mg/dL (ref 8.9–10.3)
Chloride: 107 mmol/L (ref 98–111)
Creatinine, Ser: 0.47 mg/dL (ref 0.44–1.00)
GFR, Estimated: >60 mL/min (ref >60)
Glucose, Bld: 92 mg/dL (ref 70–99)
Potassium: 3.7 mmol/L (ref 3.5–5.1)
Sodium: 138 mmol/L (ref 135–145)

## 2024-01-23 LAB — MAGNESIUM: Magnesium: 2.1 mg/dL (ref 1.7–2.4)

## 2024-01-23 LAB — PHOSPHORUS: Phosphorus: 4.7 mg/dL — ABNORMAL HIGH (ref 2.5–4.6)

## 2024-01-23 MED ORDER — TECHNETIUM TC 99M MEBROFENIN IV KIT
5.5000 | PACK | Freq: Once | INTRAVENOUS | Status: AC
  Administered 2024-01-23: 5.5 via INTRAVENOUS

## 2024-01-23 MED ORDER — INSULIN ASPART 100 UNIT/ML IJ SOLN
0.0000 [IU] | INTRAMUSCULAR | Status: DC
  Administered 2024-01-24: 1 [IU] via SUBCUTANEOUS

## 2024-01-23 MED ORDER — POTASSIUM CHLORIDE 10 MEQ/100ML IV SOLN
10.0000 meq | INTRAVENOUS | Status: AC
  Administered 2024-01-23 (×2): 10 meq via INTRAVENOUS
  Filled 2024-01-23 (×2): qty 100

## 2024-01-23 MED ORDER — TRAVASOL 10 % IV SOLN
INTRAVENOUS | Status: AC
  Filled 2024-01-23: qty 1008

## 2024-01-23 NOTE — Progress Notes (Signed)
   Subjective/Chief Complaint: Just back from HIDA Drinking the shake for the hida caused mid abd pain Nausea for most part has resolved Wants to try to eat something solid as opposed to trying shakes again   Objective: Vital signs in last 24 hours: Temp:  [98 F (36.7 C)-98.3 F (36.8 C)] 98 F (36.7 C) (08/05 0505) Pulse Rate:  [78-87] 78 (08/05 0505) Resp:  [16-18] 18 (08/05 0505) BP: (116-132)/(63-98) 116/63 (08/05 0505) SpO2:  [99 %-100 %] 100 % (08/05 0505) Last BM Date : 01/21/24  Intake/Output from previous day: 08/04 0701 - 08/05 0700 In: 1551.3 [I.V.:1451.3; IV Piggyback:100] Out: 0  Intake/Output this shift: Total I/O In: 181.9 [I.V.:181.9] Out: -   Asleep easily awakens, nontoxic Nontoxic Nonlabored Reg Soft, nt, nd, incisions c/d/I/healed  Lab Results:  No results for input(s): WBC, HGB, HCT, PLT in the last 72 hours.  BMET Recent Labs    01/22/24 0323 01/23/24 0353  NA 139 138  K 3.6 3.7  CL 107 107  CO2 22 22  GLUCOSE 126* 92  BUN 6 6  CREATININE 0.41* 0.47  CALCIUM 8.8* 9.2   PT/INR No results for input(s): LABPROT, INR in the last 72 hours. ABG No results for input(s): PHART, HCO3 in the last 72 hours.  Invalid input(s): PCO2, PO2  Studies/Results: No results found.   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 No wbc. Previous CT showing no leak;  upper GI - ok; etiology unclear - abdominal migraine? Somatic symptoms? Biliary dyskinesia? Occasionally see this in bari pts; cont  thiamine ; f/u HIDA results  Cont antiemetics as needed; added bentyl  on admission; add carafate  today  H/o lupus anticoagulant/PE - bid lovenox   FEN -electrolyte abnormalities resolved- cont TPN;  bari adv as tolerated; repeat labs in am  Dispo- TOC consult for home TPN - would like to do cyclic TPN at home. Anticipate dc home on next 24-72 hrs; if hida + for biliary dyskinesia did discuss potential lap chole on Thursday as an option  Kayla Xu M. Tanda, MD, FACS General, Bariatric, & Minimally Invasive Surgery [facility],  A [facility]    LOS: 6 days    Kayla Hayes 01/23/2024

## 2024-01-23 NOTE — Progress Notes (Signed)
 PHARMACY - TOTAL PARENTERAL NUTRITION CONSULT NOTE   Indication: Intolerance to enteral feeding  Patient Measurements: Height: 5' 7 (170.2 cm) Weight: 107.9 kg (237 lb 12.8 oz) IBW/kg (Calculated) : 61.6 TPN AdjBW (KG): 73.2 Body mass index is 37.24 kg/m. Usual Weight:   Assessment: 61 yoF s/p laparoscopic sleeve gastrectomy 12/18/23, and re-admitted on 7/30 with N/V, abdominal pain, and poor oral intake.  Pharmacy is consulted for TPN.  Surgeon is planning for likely prolonged use at home and requests cyclic TPN.   Glucose / Insulin : No hx DM -Goal CBG: <150. Range: 85-109. sSSI q6h (2 units/24 hour)   Electrolytes: Phos (4.7) slightly elevated. All other electrolytes WNL, including CorrCa (10) -Due to need to be off nutrition for HIDA, no TPN started 8/4 PM Renal: SCr, BUN WNL Hepatic: Alk Phos, T.bili WNL. Alb remains low. -TG (290) elevated and trending up. Hx hypertriglyceridemia 343 (on 04/19/23) -AST/ALT slightly elevated Intake / Output; MIVF: Strict I/O not charted - mIVF: 0.45% NaCl-KCl 20 @ 50 ml/hr per Dr. Tanda - UOP:  x10 unmeasured - last BM: 8/3 GI Imaging: - 7/31 Upper GI series: Mild esophageal dysmotility with tertiary contractions.  -8/5 HIDA:  GI Surgeries / Procedures:  - 6/30:  laparoscopic sleeve gastrectomy   Central access: Double lumen PICC placed 7/30 TPN start date: 7/31  Nutritional Goals: Goal TPN rate is 70 mL/hr provides 104 g of protein and 1522 kcals per day  RD Assessment: Estimated Needs Total Energy Estimated Needs: 1500-1650 kcals Total Protein Estimated Needs: 100-110 grams Total Fluid Estimated Needs: >/= 1.5L  Current Nutrition:  - NPO except for sips with meds for HIDA - TPN planning to continue at goal rate (no current TPN ordered due to HIDA today) - ensure max 2 oz q2h - pt has refused all doses  Significant Events: -8/4: No TPN started that evening. D10 infused from 18:00 - 00:00 this morning. Nutrition held for HIDA  with plans to resume TPN this evening. D/w MD, documented in 8/4 progress note.   Plan:  Now: KCl 10 mEq IV x 2  Today, at 1800:  Resume TPN, start cycling Cycle over 18 hours with 1 hour ramp-up and ramp-down Will provide 101 g protein, 1510 kcal Electrolytes in TPN: Decrease Phos Na 50 mEq/L K 60 mEq/L Ca 70mEq/L Mg 71mEq/L Phos 8 mmol/L Cl:Ac  = 1:1 Add standard MVI and trace elements to TPN Continue sSSI, but adjust times based on TPN cycle 2 hours after start of TPN: 20:00 1 hour after TPN stopped: 13:00 During TPN infusion: 06:00 While off of TPN: 16:00 Continue mIVF at 50 mL/hr per MD. Confirmed Dr. Tanda wants to continue this in addition to TPN. Thiamine  100 mg IV daily daily ordered by MD. Monitor TPN labs on Mon/Thurs. Recheck electrolytes with AM labs tomorrow.   Ronal CHRISTELLA Rav, PharmD 01/23/24 10:17 AM

## 2024-01-23 NOTE — Plan of Care (Signed)
  Problem: Health Behavior/Discharge Planning: Goal: Ability to manage health-related needs will improve Outcome: Progressing   Problem: Pain Managment: Goal: General experience of comfort will improve and/or be controlled Outcome: Progressing   Problem: Skin Integrity: Goal: Risk for impaired skin integrity will decrease Outcome: Progressing   Problem: Education: Goal: Ability to state signs and symptoms to report to health care provider will improve Outcome: Progressing Goal: Knowledge of the prescribed self-care regimen will improve Outcome: Progressing   Problem: Activity: Goal: Ability to tolerate increased activity will improve Outcome: Progressing   Problem: Nutritional: Goal: Nutritional status will improve Outcome: Progressing

## 2024-01-23 NOTE — Progress Notes (Signed)
 Patients CBG 64 at 1649. Patient given apple juice and CBG rechecked and was 93.

## 2024-01-24 ENCOUNTER — Ambulatory Visit

## 2024-01-24 ENCOUNTER — Other Ambulatory Visit (HOSPITAL_COMMUNITY): Payer: Self-pay

## 2024-01-24 LAB — BASIC METABOLIC PANEL WITH GFR
Anion gap: 12 (ref 5–15)
BUN: 11 mg/dL (ref 6–20)
CO2: 20 mmol/L — ABNORMAL LOW (ref 22–32)
Calcium: 8.9 mg/dL (ref 8.9–10.3)
Chloride: 107 mmol/L (ref 98–111)
Creatinine, Ser: 0.5 mg/dL (ref 0.44–1.00)
GFR, Estimated: >60 mL/min (ref >60)
Glucose, Bld: 138 mg/dL — ABNORMAL HIGH (ref 70–99)
Potassium: 3.5 mmol/L (ref 3.5–5.1)
Sodium: 139 mmol/L (ref 135–145)

## 2024-01-24 LAB — PHOSPHORUS: Phosphorus: 3.9 mg/dL (ref 2.5–4.6)

## 2024-01-24 LAB — GLUCOSE, CAPILLARY
Glucose-Capillary: 138 mg/dL — ABNORMAL HIGH (ref 70–99)
Glucose-Capillary: 96 mg/dL (ref 70–99)

## 2024-01-24 LAB — MAGNESIUM: Magnesium: 2.3 mg/dL (ref 1.7–2.4)

## 2024-01-24 MED ORDER — DICYCLOMINE HCL 10 MG PO CAPS
10.0000 mg | ORAL_CAPSULE | Freq: Three times a day (TID) | ORAL | 0 refills | Status: AC
  Filled 2024-01-24: qty 120, 30d supply, fill #0

## 2024-01-24 MED ORDER — TRAVASOL 10 % IV SOLN
INTRAVENOUS | Status: DC
  Filled 2024-01-24: qty 1008

## 2024-01-24 MED ORDER — HEPARIN SOD (PORK) LOCK FLUSH 100 UNIT/ML IV SOLN
250.0000 [IU] | INTRAVENOUS | Status: AC | PRN
  Administered 2024-01-24: 250 [IU]

## 2024-01-24 MED ORDER — SODIUM CHLORIDE 0.9 % IV BOLUS
1000.0000 mL | Freq: Once | INTRAVENOUS | Status: DC
  Filled 2024-01-24: qty 1000

## 2024-01-24 MED ORDER — OXYCODONE HCL 5 MG PO TABS
5.0000 mg | ORAL_TABLET | Freq: Four times a day (QID) | ORAL | 0 refills | Status: AC | PRN
  Filled 2024-01-24: qty 5, 2d supply, fill #0

## 2024-01-24 MED ORDER — POTASSIUM CHLORIDE 10 MEQ/50ML IV SOLN
10.0000 meq | INTRAVENOUS | Status: AC
  Administered 2024-01-24 (×2): 10 meq via INTRAVENOUS
  Filled 2024-01-24 (×2): qty 50

## 2024-01-24 MED ORDER — INSULIN ASPART 100 UNIT/ML IJ SOLN: 0.0000 [IU] | INTRAMUSCULAR | Status: DC

## 2024-01-24 NOTE — Progress Notes (Signed)
 PHARMACY - TOTAL PARENTERAL NUTRITION CONSULT NOTE   Indication: Intolerance to enteral feeding  Patient Measurements: Height: 5' 7 (170.2 cm) Weight: 107.9 kg (237 lb 12.8 oz) IBW/kg (Calculated) : 61.6 TPN AdjBW (KG): 73.2 Body mass index is 37.24 kg/m. Usual Weight:   Assessment: 64 yoF s/p laparoscopic sleeve gastrectomy 12/18/23, and re-admitted on 7/30 with N/V, abdominal pain, and poor oral intake.  Pharmacy is consulted for TPN.  Surgeon is planning for likely prolonged use at home and requests cyclic TPN.   Glucose / Insulin : No hx DM -Goal CBG: <150. Range: 64-138. sSSI q6h (1 units/24 hour)   -Note that BG of 64 obtained while off of parenteral nutrition for HIDA Electrolytes: All electrolytes WNL, including CorrCa (9.7) -Bicarb slightly low Renal: SCr, BUN WNL Hepatic: Alk Phos, T.bili WNL. Alb remains low. -TG (290) elevated and trending up. Hx hypertriglyceridemia 343 (on 04/19/23) -AST/ALT slightly elevated Intake / Output; MIVF: Strict I/O not charted - mIVF: 0.45% NaCl-KCl 20 @ 50 ml/hr per Dr. Tanda - UOP:  x3 unmeasured - last BM: 8/5 x2 GI Imaging: - 7/31 Upper GI series: Mild esophageal dysmotility with tertiary contractions.  -8/5 HIDA:  GI Surgeries / Procedures:  - 6/30:  laparoscopic sleeve gastrectomy   Central access: Double lumen PICC placed 7/30 TPN start date: 7/31  Nutritional Goals: Goal TPN rate is 70 mL/hr provides 104 g of protein and 1522 kcals per day  RD Assessment: Estimated Needs Total Energy Estimated Needs: 1500-1650 kcals Total Protein Estimated Needs: 100-110 grams Total Fluid Estimated Needs: >/= 1.5L  Current Nutrition:  - Diet: Bariatric advanced - TPN at goal rate (pt will discharge on TPN). Cycling. - ensure max 2 oz q2h - pt has refused all doses  Significant Events: -8/4: No TPN started that evening. D10 infused from 18:00 - 00:00 overnight. Nutrition held for HIDA, TPN resumed 8/5.  -8/4: Confirmed with Dr.  Tanda to continue mIVF as ordered in addition to goal rate TPN   Plan:  Now: KCl 10 mEq IV x 2  Today, at 1800:  Continue cyclic TPN. Advance cycle to 16 hours 1 hour ramp-up and ramp-down Will provide 101 g protein, 1510 kcal Electrolytes in TPN: Increase K, Phos. Decrease Ca Na 50 mEq/L K 65 mEq/L Ca 3 mEq/L Mg 5 mEq/L Phos 10 mmol/L Cl:Ac  = 1:1 Add standard MVI and trace elements to TPN Continue sSSI, adjust times based on TPN cycle 2 hours after start of TPN: 20:00 1 hour after TPN stopped: 11:00 During TPN infusion: 06:00 While off of TPN: 16:00 Continue mIVF at 50 mL/hr per MD Thiamine  100 mg IV daily daily ordered by MD. Monitor TPN labs on Mon/Thurs. Recheck TG with AM labs tomorrow.   Ronal CHRISTELLA Rav, PharmD 01/24/24 6:29 AM

## 2024-01-24 NOTE — Progress Notes (Signed)
 Discharge mediations delivered to patient at bedside D [facility]

## 2024-01-24 NOTE — TOC Transition Note (Signed)
 Transition of Care [facility]) - Discharge Note   Patient Details  Name: Kayla Hayes MRN: [id number] Date of Birth: [birth year]  Transition of Care [facility]) CM/SW Contact:  Alfonse JONELLE Rex, RN Phone Number: 01/24/2024, 11:30 AM   Clinical Narrative:   DC to Home order. Home TPN and HH RN arranged with [facility] per rep-Pam.  No further TOC needs identified at this time.     Final next level of care: Home w Home Health Services Barriers to Discharge: Barriers Resolved   Patient Goals and CMS Choice Patient states their goals for this hospitalization and ongoing recovery are:: return home with s/o          Discharge Placement                       Discharge Plan and Services Additional resources added to the After Visit Summary for   In-house Referral: Clinical Social Work   Post Acute Care Choice: Home Health          DME Arranged: N/A DME Agency: NA       HH Arranged: IV Antibiotics, RN HH Agency: Surveyor, mining Date HH Agency Contacted: 01/18/24 Time HH Agency Contacted: 0900 Representative spoke with at [facility] Agency: Holley Herring, RN  Social Drivers of Health (SDOH) Interventions SDOH Screenings   Food Insecurity: No Food Insecurity (01/17/2024)  Housing: Low Risk  (01/17/2024)  Transportation Needs: No Transportation Needs (01/17/2024)  Utilities: Not At Risk (01/17/2024)  Alcohol Screen: Low Risk  (09/25/2023)  Depression (PHQ2-9): Medium Risk (11/15/2023)  Financial Resource Strain: Medium Risk (09/25/2023)  Physical Activity: Sufficiently Active (09/25/2023)  Social Connections: Socially Integrated (09/25/2023)  Stress: Stress Concern Present (09/25/2023)  Tobacco Use: Low Risk  (01/17/2024)     Readmission Risk Interventions    01/24/2024   11:29 AM 01/18/2024    9:54 AM  Readmission Risk Prevention Plan  Post Dischage Appt  Complete  Medication Screening  Complete  Transportation Screening Complete Complete  PCP or Specialist Appt within 5-7 Days  Complete   Home Care Screening Complete   Medication Review (RN CM) Complete

## 2024-01-24 NOTE — Plan of Care (Signed)
   Problem: Education: Goal: Knowledge of General Education information will improve Description Including pain rating scale, medication(s)/side effects and non-pharmacologic comfort measures Outcome: Progressing   Problem: Health Behavior/Discharge Planning: Goal: Ability to manage health-related needs will improve Outcome: Progressing

## 2024-01-25 ENCOUNTER — Encounter: Payer: Self-pay | Admitting: General Surgery

## 2024-01-25 ENCOUNTER — Encounter (HOSPITAL_COMMUNITY): Payer: Self-pay | Admitting: Emergency Medicine

## 2024-01-25 ENCOUNTER — Encounter: Attending: General Surgery | Admitting: Skilled Nursing Facility1

## 2024-01-25 ENCOUNTER — Ambulatory Visit (HOSPITAL_COMMUNITY): Admitting: Licensed Clinical Social Worker

## 2024-01-25 ENCOUNTER — Other Ambulatory Visit: Payer: Self-pay

## 2024-01-25 ENCOUNTER — Emergency Department (HOSPITAL_COMMUNITY)
Admission: EM | Admit: 2024-01-25 | Discharge: 2024-01-25 | Disposition: A | Discharge status: Home / Self Care | Attending: Emergency Medicine | Admitting: Emergency Medicine

## 2024-01-25 DIAGNOSIS — E876 Hypokalemia: Secondary | ICD-10-CM | POA: Diagnosis not present

## 2024-01-25 DIAGNOSIS — F419 Anxiety disorder, unspecified: Secondary | ICD-10-CM | POA: Diagnosis not present

## 2024-01-25 DIAGNOSIS — E669 Obesity, unspecified: Secondary | ICD-10-CM | POA: Diagnosis present

## 2024-01-25 DIAGNOSIS — R55 Syncope and collapse: Secondary | ICD-10-CM | POA: Diagnosis present

## 2024-01-25 LAB — CBC WITH DIFFERENTIAL/PLATELET
Abs Immature Granulocytes: 0.01 K/uL (ref 0.00–0.07)
Basophils Absolute: 0 K/uL (ref 0.0–0.1)
Basophils Relative: 1 %
Eosinophils Absolute: 0 K/uL (ref 0.0–0.5)
Eosinophils Relative: 0 %
HCT: 41.1 % (ref 36.0–46.0)
Hemoglobin: 13.3 g/dL (ref 12.0–15.0)
Immature Granulocytes: 0 %
Lymphocytes Relative: 19 %
Lymphs Abs: 1.1 K/uL (ref 0.7–4.0)
MCH: 28.5 pg (ref 26.0–34.0)
MCHC: 32.4 g/dL (ref 30.0–36.0)
MCV: 88 fL (ref 80.0–100.0)
Monocytes Absolute: 0.5 K/uL (ref 0.1–1.0)
Monocytes Relative: 9 %
Neutro Abs: 3.9 K/uL (ref 1.7–7.7)
Neutrophils Relative %: 71 %
Platelets: 202 K/uL (ref 150–400)
RBC: 4.67 MIL/uL (ref 3.87–5.11)
RDW: 14.7 % (ref 11.5–15.5)
WBC: 5.4 K/uL (ref 4.0–10.5)
nRBC: 0 % (ref 0.0–0.2)

## 2024-01-25 LAB — BASIC METABOLIC PANEL WITH GFR
Anion gap: 10 (ref 5–15)
BUN: 16 mg/dL (ref 6–20)
CO2: 20 mmol/L — ABNORMAL LOW (ref 22–32)
Calcium: 9.4 mg/dL (ref 8.9–10.3)
Chloride: 110 mmol/L (ref 98–111)
Creatinine, Ser: 0.51 mg/dL (ref 0.44–1.00)
GFR, Estimated: >60 mL/min (ref >60)
Glucose, Bld: 95 mg/dL (ref 70–99)
Potassium: 3.4 mmol/L — ABNORMAL LOW (ref 3.5–5.1)
Sodium: 140 mmol/L (ref 135–145)

## 2024-01-25 LAB — D-DIMER, QUANTITATIVE: D-Dimer, Quant: 0.44 ug{FEU}/mL (ref 0.00–0.50)

## 2024-01-25 LAB — HCG, SERUM, QUALITATIVE: Preg, Serum: NEGATIVE

## 2024-01-25 LAB — CBG MONITORING, ED: Glucose-Capillary: 92 mg/dL (ref 70–99)

## 2024-01-25 NOTE — Discharge Summary (Signed)
 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  She initially did well with oral intake but about 2 weeks after surgeries or started having issues with oral intake she would have nausea and postprandial vomiting about 30 minutes after eating.  She would also have mid abdominal pain.  And got to the point where she was really not able to take anything in other than water .  She had been sent for outpatient IV fluids several times.  She came to clinic and I felt the best option was to do an admission to start parenteral nutrition.  She had been to the ED prior to her postop visit and had a negative CT scan.   Hospital Course:  Patient was admitted to the hospital she was started on IV fluids.  A PICC line was placed.  Patient had some electrolyte abnormalities mainly hypokalemia that required several days to correct.  TPN was initiated.  She underwent an upper GI that showed no evidence of obstruction or leak.  She was started on Bentyl  and continued on antiemetics.  She was maintained on twice daily Lovenox  prophylaxis.  Her nausea gradually improved.  She was ambulating in the hall.  We ended up getting a HIDA scan with ejection fraction to try to see if some of this was potential biliary colic.  On the HIDA there is no evidence of cholecystitis she was unable to  drink enough of the Ensure to calculate a gallbladder ejection fraction.  She requested trying to eat some solid food.  On the day before discharge she actually tolerated solid food without nausea or postprandial vomiting.  She was discharged to home with home health services for cyclic TPN as well as twice a week supplemental IV fluids.  Her electrolytes had normalized.  BP 119/67 (BP Location: Left Arm)   Pulse 90   Temp (!) 97.5 F (36.4 C) (Oral)   Resp 15   Ht 5' 7 (1.702 m)   Wt 107.9 kg   LMP 12/28/2023 (Approximate)   SpO2 100%   BMI 37.24 kg/m   Gen: alert, NAD, non-toxic appearing Pupils: equal, no scleral icterus Pulm: Lungs clear to auscultation, symmetric chest rise CV: regular rate and rhythm Abd: soft, nontender, nondistended. Well-healed trocar sites. No cellulitis. No incisional hernia Ext: no edema, no calf tenderness Skin: no rash, no jaundice    Discharge Instructions  Discharge Instructions     Ambulate hourly while awake   Complete by: As directed    Call MD for:  difficulty breathing, headache or visual disturbances   Complete by: As directed    Call MD for:  persistant dizziness or light-headedness   Complete by: As directed    Call MD for:  persistant nausea and vomiting   Complete by: As directed    Call MD for:  redness, tenderness, or signs of infection (pain, swelling, redness, odor or green/yellow discharge around incision site)   Complete by: As directed  Call MD for:  severe uncontrolled pain   Complete by: As directed    Call MD for:  temperature >101 F   Complete by: As directed    Diet general   Complete by: As directed    Discharge instructions   Complete by: As directed    See bariatric discharge instructions      Allergies as of 01/24/2024       Reactions   Reglan  [metoclopramide ] Other (See Comments)   Weird sensations through body, loss of muscle control        Medication List     STOP taking these medications     enoxaparin  40 MG/0.4ML injection Commonly known as: LOVENOX    gabapentin  100 MG capsule Commonly known as: NEURONTIN    sucralfate  1 GM/10ML suspension Commonly known as: CARAFATE        TAKE these medications    albuterol  108 (90 Base) MCG/ACT inhaler Commonly known as: VENTOLIN  HFA Inhale 2 puffs into the lungs every 4 (four) hours as needed. What changed: reasons to take this   ascorbic acid 500 MG tablet Commonly known as: VITAMIN C Take 500 mg by mouth daily.   cetirizine  10 MG tablet Commonly known as: ZYRTEC  Take 1 tablet (10 mg total) by mouth at bedtime as needed for allergies or rhinitis. What changed: when to take this   cholecalciferol 25 MCG (1000 UNIT) tablet Commonly known as: VITAMIN D3 Take 1,000 Units by mouth daily.   dicyclomine  10 MG capsule Commonly known as: BENTYL  Take 1 capsule (10 mg total) by mouth 4 (four) times daily -  before meals and at bedtime.   ipratropium 0.03 % nasal spray Commonly known as: ATROVENT  Place 2 sprays into both nostrils every 12 (twelve) hours.   Kyleena  19.5 MG IUD Generic drug: levonorgestrel  1 each by Intrauterine route once.   linaclotide 145 MCG Caps capsule Commonly known as: LINZESS Take 145 mcg by mouth daily as needed (constipation).   montelukast  10 MG tablet Commonly known as: SINGULAIR  TAKE 1 TABLET BY MOUTH EVERYDAY AT BEDTIME What changed: See the new instructions.   multivitamin with minerals Tabs tablet Take 1 tablet by mouth daily.   Nurtec 75 MG Tbdp Generic drug: Rimegepant Sulfate  Take 75 mg by mouth as needed (migraine).   ondansetron  4 MG disintegrating tablet Commonly known as: ZOFRAN -ODT Take 1 tablet (4 mg total) by mouth every 6 (six) hours as needed for nausea or vomiting.   oxyCODONE  5 MG immediate release tablet Commonly known as: Oxy IR/ROXICODONE  Take 1 tablet (5 mg total) by mouth every 6 (six) hours as needed for breakthrough pain or severe pain (pain score 7-10).    pantoprazole  40 MG tablet Commonly known as: PROTONIX  Take 1 tablet (40 mg total) by mouth daily.   promethazine  12.5 MG tablet Commonly known as: PHENERGAN  Take 12.5 mg by mouth every 8 (eight) hours as needed for nausea or vomiting.   Qulipta  60 MG Tabs Generic drug: Atogepant  Take 1 tablet (60 mg total) by mouth daily.   rizatriptan  10 MG tablet Commonly known as: Maxalt  Take 1 tablet (10 mg total) by mouth as needed for migraine. May repeat in 2 hours if needed. Max 30 mg in 24 hours   topiramate  50 MG tablet Commonly known as: TOPAMAX  Take 1 tablet (50 mg total) by mouth at bedtime.   venlafaxine  XR 150 MG 24 hr capsule Commonly known as: EFFEXOR -XR Take 1 capsule (150 mg total) by mouth daily with breakfast.  The results of significant diagnostics from this hospitalization (including imaging, microbiology, ancillary and laboratory) are listed below for reference.    Significant Diagnostic Studies: NM Hepato W/EF Result Date: 01/23/2024 CLINICAL DATA:  Two-week history of abdominal pain associated with nausea EXAM: NUCLEAR MEDICINE HEPATOBILIARY IMAGING WITH GALLBLADDER EF TECHNIQUE: Sequential images of the abdomen were obtained out to 60 minutes following intravenous administration of radiopharmaceutical. Patient attempted oral ingestion of Ensure, however became symptomatic with abdominal cramping and nausea and could only tolerate 1/4 of the total volume. Gallbladder ejection fraction calculation was attempted. RADIOPHARMACEUTICALS:  5.5 mCi Tc-35m  Choletec  IV COMPARISON:  CT abdomen and pelvis dated 01/08/2024 FINDINGS: Prompt uptake and biliary excretion of activity by the liver is seen. Gallbladder activity is visualized, consistent with patency of cystic duct. Biliary activity passes into small bowel, consistent with patent common bile duct. Calculated gallbladder ejection fraction is 0%, noting the patient could not tolerate the full volume of Ensure.  IMPRESSION: 1. Patent cystic and common bile ducts. 2. Technically challenging evaluation of gallbladder ejection fraction as described. Electronically Signed   By: Limin  Xu M.D.   On: 01/23/2024 11:51   DG UGI W SINGLE CM (SOL OR THIN BA) Result Date: 01/18/2024 CLINICAL DATA:  Provided history: Status post laparoscopic sleeve gastrectomy. Dehydration. Periumbilical abdominal pain. Nausea and vomiting. Additional history: Patient status post sleeve gastrectomy 12/18/2023 with readmission for nausea and vomiting, mid abdominal pain and subsequent poor oral intake. EXAM: UPPER GI SERIES WITH KUB TECHNIQUE: A scout radiograph of the abdomen was acquired. Subsequently, a water -soluble contrast upper GI series was performed. The exam was performed by [place], NP, and was supervised and interpreted by Dr. Rockey Childs. FLUOROSCOPY: FLUOROSCOPY Radiation Exposure Index (as provided by the fluoroscopic device): 77.0 mGy Kerma COMPARISON:  CT abdomen/pelvis 01/08/2024. Upper GI series 06/19/2023. FINDINGS: The patient could only tolerate swallowing small volumes of contrast due to nausea. Within this limitation, findings are as follows. Nonobstructive bowel gas pattern on scout abdominal radiograph. Fluoroscopic evaluation demonstrates normal caliber and smooth contour of the esophagus. No evidence of a fixed stricture. 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 No appreciable hiatal hernia. No gastroesophageal reflux observed. Unremarkable appearance of the gastric remnant status post sleeve gastrectomy. Unremarkable appearance of the duodenal bulb and duodenal sweep. The patient swallowed a 13 mm barium tablet, which traversed the esophagus and passed into the stomach without delay. IMPRESSION: 1. The patient could only tolerate swallowing small volumes of contrast due to nausea. Within this limitation, findings are as follows. 2. Mild esophageal dysmotility with tertiary  contractions. 3. Otherwise unremarkable examination status post sleeve gastrectomy, as described. Electronically Signed   By: Rockey Childs D.O.   On: 01/18/2024 10:04   US  EKG SITE RITE Result Date: 01/17/2024 If Site Rite image not attached, placement could not be confirmed due to current cardiac rhythm.  CT ABDOMEN PELVIS W CONTRAST Result Date: 01/08/2024 CLINICAL DATA:  Abdominal pain. Concern for obstruction. Recent surgery. EXAM: CT ABDOMEN AND PELVIS WITH CONTRAST TECHNIQUE: Multidetector CT imaging of the abdomen and pelvis was performed using the standard protocol following bolus administration of intravenous contrast. RADIATION DOSE REDUCTION: This exam was performed according to the departmental dose-optimization program which includes automated exposure control, adjustment of the mA and/or kV according to patient size and/or use of iterative reconstruction technique. CONTRAST:  OMNIPAQUE  IOHEXOL  300 MG/ML  SOLN COMPARISON:  None Available. FINDINGS: Lower chest: The visualized lung bases are clear. No intra-abdominal free air or  free fluid. Hepatobiliary: Apparent mild fatty liver. No biliary dilatation. The gallbladder is unremarkable. Pancreas: Unremarkable. No pancreatic ductal dilatation or surrounding inflammatory changes. Spleen: Normal in size without focal abnormality. Adrenals/Urinary Tract: The adrenal glands unremarkable. There is no hydronephrosis on either side. There is symmetric enhancement and excretion of contrast by both kidneys. The visualized ureters and urinary bladder appear unremarkable. Stomach/Bowel: Postsurgical changes of gastric sleeve. There is no bowel obstruction or active inflammation. The appendix is normal. Vascular/Lymphatic: The abdominal aorta and IVC are unremarkable. No portal venous gas. There is no adenopathy. Reproductive: The uterus is anteverted. An intrauterine device is noted dense inferiorly displaced and located in the endocervical region. No  suspicious adnexal masses. 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 No acute osseous pathology. IMPRESSION: 1. No acute intra-abdominal or pelvic pathology. 2. Postsurgical changes of gastric sleeve. No bowel obstruction. Normal appendix. 3. Inferiorly displaced intrauterine device located in the endocervical region. Gynecology referral is advised. 4. Mild fatty liver. 5. 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 Electronically Signed   By: Vanetta Chou M.D.   On: 01/08/2024 15:09    Microbiology: No results found for this or any previous visit (from the past 240 hours).   Labs: Basic Metabolic Panel: Recent Labs  Lab 01/20/24 0309 01/21/24 0204 01/22/24 0323 01/23/24 0353 01/24/24 0319  NA 141 139 139 138 139  K 3.2* 3.3* 3.6 3.7 3.5  CL 110 111 107 107 107  CO2 22 21* 22 22 20*  GLUCOSE 112* 124* 126* 92 138*  BUN <5* <5* 6 6 11   CREATININE 0.49 0.50 0.41* 0.47 0.50  CALCIUM 8.9 8.7* 8.8* 9.2 8.9  MG 2.2 2.0 2.1 2.1 2.3  PHOS 2.6 3.5 3.8 4.7* 3.9   Liver Function Tests: Recent Labs  Lab 01/19/24 0238 01/22/24 0323  AST 24 89*  ALT 26 76*  ALKPHOS 61 49  BILITOT 0.9 0.3  PROT 5.5* 5.3*  ALBUMIN 3.4* 3.0*     CBG: Recent Labs  Lab 01/23/24 1649 01/23/24 1741 01/23/24 2017 01/24/24 0559 01/24/24 1313  GLUCAP 64* 93 111* 138* 96    Principal Problem:   Nausea & vomiting   Time coordinating discharge: 30 min  Signed:  Camellia CHRISTELLA Blush, MD [facility] [phone] 01/25/2024, 7:37 AM

## 2024-01-25 NOTE — Discharge Instructions (Signed)
 Today you were seen for a near syncopal episode, I suspect this is likely due to vagus nerve stimulation.  Please return to the ED if you continue to have syncopal/near syncopal episodes, uncontrollable vomiting, or worsening abdominal pain.  Thank you for letting us  treat you today. After reviewing your labs, I feel you are safe to go home. Please follow up with your PCP in the next several days and provide them with your records from this visit. Return to the Emergency Room if pain becomes severe or symptoms worsen.

## 2024-01-25 NOTE — Progress Notes (Signed)
 Bariatric Nutrition Follow-Up Visit Medical Nutrition Therapy  Appt Start Time: 2:40   End Time: 3:18  Surgery date: 12/18/2023 Surgery type: Sleeve Gastrectomy  Anthropometrics  Start weight at [facility]: 275.2 lbs (date: 07/10/2023)  Height: 67 in Weight today: virtual appt: pt identified by name and DOB, pt agreeable to this visit type   Clinical   Medical hx: obesity, asthma Medications: Currently TPN running 12 hours over night with thiamine  and multivitamin additions  Labs: WNL Notable signs/symptoms: none noted Any previous deficiencies? No Bowel Habits: Every day to every other day no complaints   Body Composition Scale 01/02/2024  Current Body Weight 249.6  Total Body Fat % 42.0  Visceral Fat 9  Fat-Free Mass % 57.9   Total Body Water  % 43.4  Muscle-Mass lbs 35.8  BMI 38.8  Body Fat Displacement          Torso  lbs 64.9         Left Leg  lbs 12.9         Right Leg  lbs 12.9         Left Arm  lbs 6.4         Right Arm  lbs 6.4      Lifestyle & Dietary Hx  Pt states she was Cleaned out with linzess. Pt states she is worried her stomach stretched out and the surgery is not working.  Pt states today was not great because she has not been to the grocery store yet.  Pt states she is experiencing diarrhea: 4+ lose stools daily.  Pt states she is taking tylenol  and bentyl  for abdominal pain. Pt states she placed an instacart order so getting groceries later today.  Pt states she does have anxiety and is worried about school starting next week and has not been able to get in with her therapist and is unsure she will have time with starting school (work).   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 Snack: 2 gerkins  Third Meal:  Snack:  Beverages:   Post-Op Goals/ Signs/ Symptoms Using straws:  no Drinking while eating: no Chewing/swallowing difficulties: no Changes in vision: no Changes to mood/headaches: no Hair loss/changes to skin/nails: no Difficulty focusing/concentrating: no Sweating: no Limb weakness: no Dizziness/lightheadedness: no Palpitations: no  Carbonated/caffeinated beverages: no N/V/D/C/Gas: yes Abdominal pain: noyes Dumping syndrome:     NUTRITION DIAGNOSIS  Overweight/obesity ([place]-3.3) related to past poor dietary habits and physical inactivity as evidenced by completed bariatric surgery and following dietary guidelines for continued weight loss and healthy nutrition status.     NUTRITION INTERVENTION Nutrition counseling (C-1) and education (E-2) to facilitate bariatric surgery goals, including: Worked with pt on increasing the foods she is comfortable with trying within the context of meeting her goals to slowly reduce her reliance on TPN working towards coming off TPN completely  Educated pt on Physchosomatic symptoms and the role anxiety plays on GI discomfort post bariatric surgery    Learning Style & Readiness for Change Teaching method utilized: Patent attorney & Auditory  Demonstrated degree of understanding via: Teach Back  Readiness Level: Action Barriers to learning/adherence to lifestyle change: Stomach discomfort/nausea    MONITORING & EVALUATION Dietary intake, weekly physical activity, body weight  Next Steps Patient is to follow-up in 2 weeks

## 2024-01-25 NOTE — ED Provider Notes (Cosign Needed Addendum)
 [place] [facility]GENC[facility] DEPARTMENT AT [facility] Provider Note   CSN: [id number] Arrival date & time: 01/25/24  2013     Patient presents with: Shortness of Breath and Near Syncope   Kayla Hayes is a 23 y.o. female past medical history significant for anxiety and status post gastric sleeve on 6/30 presents today for a near syncopal episode.  Patient was recently discharged yesterday for continued abdominal pain and vomiting, patient had a PICC line placed and is currently getting TPN.  Patient was receiving TPN and flushing her port when she had a near syncopal episode.  Patient reported dizziness, diaphoresis, anxiety, palpitations, and tachypnea during the episode.  Patient did not ever lose consciousness as she laid down on the ground.  Patient denies fever, chills, nausea, vomiting, abdominal pain, diarrhea, any other complaints at this time.   HPI     Prior to Admission medications   Medication Sig Start Date End Date Taking? Authorizing Provider  albuterol  (VENTOLIN  HFA) 108 (90 Base) MCG/ACT inhaler Inhale 2 puffs into the lungs every 4 (four) hours as needed. Patient taking differently: Inhale 2 puffs into the lungs every 4 (four) hours as needed for wheezing or shortness of breath. 12/14/22   Copland, Harlene BROCKS, MD  ascorbic acid (VITAMIN C) 500 MG tablet Take 500 mg by mouth daily.    [provider]  Atogepant  (QULIPTA ) 60 MG TABS Take 1 tablet (60 mg total) by mouth daily. 11/20/23   Copland, Harlene BROCKS, MD  cetirizine  (ZYRTEC ) 10 MG tablet Take 1 tablet (10 mg total) by mouth at bedtime as needed for allergies or rhinitis. Patient taking differently: Take 10 mg by mouth daily. 01/08/24   Copland, Jessica C, MD  cholecalciferol (VITAMIN D3) 25 MCG (1000 UNIT) tablet Take 1,000 Units by mouth daily.    [provider]  dicyclomine  (BENTYL ) 10 MG capsule Take 1 capsule (10 mg total) by mouth 4 (four) times daily -  before meals and at bedtime. 01/24/24    Tanda Locus, MD  ipratropium (ATROVENT ) 0.03 % nasal spray Place 2 sprays into both nostrils every 12 (twelve) hours. Patient not taking: Reported on 11/29/2023 10/23/23   Teresa Shelba SAUNDERS, NP  levonorgestrel  (KYLEENA ) 19.5 MG IUD 1 each by Intrauterine route once.    [provider]  linaclotide (LINZESS) 145 MCG CAPS capsule Take 145 mcg by mouth daily as needed (constipation). 01/12/24   [provider]  montelukast  (SINGULAIR ) 10 MG tablet TAKE 1 TABLET BY MOUTH EVERYDAY AT BEDTIME Patient taking differently: Take 10 mg by mouth at bedtime. 11/03/23   Copland, Jessica C, MD  Multiple Vitamin (MULTIVITAMIN WITH MINERALS) TABS tablet Take 1 tablet by mouth daily.    [provider]  ondansetron  (ZOFRAN -ODT) 4 MG disintegrating tablet Take 1 tablet (4 mg total) by mouth every 6 (six) hours as needed for nausea or vomiting. 12/20/23   Tanda Locus, MD  oxyCODONE  (OXY IR/ROXICODONE ) 5 MG immediate release tablet Take 1 tablet (5 mg total) by mouth every 6 (six) hours as needed for breakthrough pain or severe pain (pain score 7-10). 01/24/24   Tanda Locus, MD  pantoprazole  (PROTONIX ) 40 MG tablet Take 1 tablet (40 mg total) by mouth daily. 12/20/23   Tanda Locus, MD  promethazine  (PHENERGAN ) 12.5 MG tablet Take 12.5 mg by mouth every 8 (eight) hours as needed for nausea or vomiting. 01/09/24   [provider]  Rimegepant Sulfate  (NURTEC) 75 MG TBDP Take 75 mg by mouth as  needed (migraine).    [provider]  rizatriptan  (MAXALT ) 10 MG tablet Take 1 tablet (10 mg total) by mouth as needed for migraine. May repeat in 2 hours if needed. Max 30 mg in 24 hours 11/20/23   Copland, Harlene BROCKS, MD  topiramate  (TOPAMAX ) 50 MG tablet Take 1 tablet (50 mg total) by mouth at bedtime. 12/06/23   Copland, Harlene BROCKS, MD  venlafaxine  XR (EFFEXOR -XR) 150 MG 24 hr capsule Take 1 capsule (150 mg total) by mouth daily with breakfast. 04/19/23   Copland, Harlene BROCKS, MD    Allergies:  Reglan  [metoclopramide ]    Review of Systems  Updated Vital Signs BP (!) 145/114   Pulse 87   Temp 98.7 F (37.1 C) (Oral)   Resp (!) 30   LMP 12/28/2023 (Approximate)   SpO2 100%   Physical Exam Vitals and nursing note reviewed.  Constitutional:      General: She is not in acute distress.    Appearance: Normal appearance. She is well-developed. She is not ill-appearing, toxic-appearing or diaphoretic.  HENT:     Head: Normocephalic and atraumatic.     Mouth/Throat:     Mouth: Mucous membranes are moist.     Pharynx: Oropharynx is clear.  Eyes:     Extraocular Movements: Extraocular movements intact.     Conjunctiva/sclera: Conjunctivae normal.  Cardiovascular:     Rate and Rhythm: Normal rate and regular rhythm.     Pulses: Normal pulses.     Heart sounds: Normal heart sounds. No murmur heard. Pulmonary:     Effort: Pulmonary effort is normal. No respiratory distress.     Breath sounds: Normal breath sounds.  Abdominal:     General: There is no distension.     Palpations: Abdomen is soft.     Tenderness: There is no abdominal tenderness.  Musculoskeletal:        General: No swelling.     Cervical back: Neck supple.  Skin:    General: Skin is warm and dry.     Capillary Refill: Capillary refill takes less than 2 seconds.  Neurological:     General: No focal deficit present.     Mental Status: She is alert and oriented to person, place, and time.  Psychiatric:        Mood and Affect: Mood is anxious.     (all labs ordered are listed, but only abnormal results are displayed) Labs Reviewed  BASIC METABOLIC PANEL WITH GFR - Abnormal; Notable for the following components:      Result Value   Potassium 3.4 (*)    CO2 20 (*)    All other components within normal limits  CBC WITH DIFFERENTIAL/PLATELET  HCG, SERUM, QUALITATIVE  D-DIMER, QUANTITATIVE  CBG MONITORING, ED    EKG: None  Radiology: No results found.   Procedures   Medications Ordered in the  ED - No data to display                                  Medical Decision Making Amount and/or Complexity of Data Reviewed Labs: ordered.   This patient presents to the ED for concern of near syncopal episode, this involves an extensive number of treatment options, and is a complaint that carries with it a high risk of complications and morbidity.  The differential diagnosis includes arrhythmia, hypoglycemia, anemia, electrolyte abnormality, anxiety, vasovagal syncope, PE   Co morbidities / Chronic  conditions that complicate the patient evaluation  Anxiety, status post gastric sleeve   Additional history obtained:  Additional history obtained from EMR External records from outside source obtained and reviewed including admission documents   Lab Tests:  I Ordered, and personally interpreted labs.  The pertinent results include: Glucose 92, CBC WNL, mild hypokalemia 3.4, negative D-dimer, negative pregnancy   Cardiac Monitoring: / EKG:  The patient was maintained on a cardiac monitor.  I personally viewed and interpreted the cardiac monitored which showed an underlying rhythm of: Sinus rhythm  Test / Admission - Considered:  Patient orthostatic negative Considered for admission or further workup however patient's vitals, physical exam, and labs are reassuring.  Patient's symptoms likely due to anxiety/vasovagal syncope.  Patient given return precautions.  I feel patient is safe for discharge at this time.     Final diagnoses:  Near syncope    ED Discharge Orders     None          Francis Ileana LOISE DEVONNA 01/25/24 2203    Francis Ileana LOISE, PA-C 01/25/24 2204    Dasie Faden, MD 01/26/24 [phone]

## 2024-01-25 NOTE — ED Triage Notes (Signed)
 Patient was discharged from the hospital yesterday. She was sent home with a PICC line and TPN to administer at home. While administering tonight, she became short of breath, lightheaded and had chest discomfort. Chest discomfort has subsided. EMS noted anxiety.   EMS vitals: 126/86 BP 88 HR 22 RR 106 CBG

## 2024-01-29 ENCOUNTER — Telehealth (HOSPITAL_COMMUNITY): Payer: Self-pay | Admitting: *Deleted

## 2024-01-29 NOTE — Telephone Encounter (Signed)
 Asked pt how she was doing. She stated, that she is taking it day by day.  Informed pt of Surgeon request to consider keeping either a food log journal or take pictures of her meals and send it to the surgeons office. Patient stated that she felt comfortable doing the food logs and has been working on finding/eating foods that are easier on her digestive system. Aiming to eat every 2 to 4 hours.  Encouraged patient to pace it out and if for any reason she would need to change or adjust the food logs, it was no pressure and she can just let our office, [facility], or [facility] know.  Patient acknowledged and receptive.

## 2024-02-05 ENCOUNTER — Ambulatory Visit: Admitting: Dietician

## 2024-02-06 ENCOUNTER — Telehealth: Payer: Self-pay | Admitting: Dietician

## 2024-02-06 NOTE — Telephone Encounter (Signed)
 Called patient to follow-up on missed appointment yesterday and po intake. No answer. Left voice message with call back number.

## 2024-02-08 ENCOUNTER — Inpatient Hospital Stay: Admission: RE | Admit: 2024-02-08 | Source: Ambulatory Visit

## 2024-02-21 ENCOUNTER — Encounter: Payer: Self-pay | Admitting: Skilled Nursing Facility1

## 2024-02-21 ENCOUNTER — Encounter: Attending: General Surgery | Admitting: Skilled Nursing Facility1

## 2024-02-21 VITALS — Wt 247.0 lb

## 2024-02-21 DIAGNOSIS — R112 Nausea with vomiting, unspecified: Secondary | ICD-10-CM | POA: Insufficient documentation

## 2024-02-21 DIAGNOSIS — E669 Obesity, unspecified: Secondary | ICD-10-CM | POA: Diagnosis present

## 2024-02-21 NOTE — Progress Notes (Unsigned)
 Bariatric Nutrition Follow-Up Visit Medical Nutrition Therapy  Appt Start Time: 4:41   End Time: 5:15  Surgery date: 12/18/2023 Surgery type: Sleeve Gastrectomy  Anthropometrics  Start weight at [facility]: 275.2 lbs (date: 07/10/2023)  Height: 67 in Weight today:    Clinical   Medical hx: obesity, asthma Medications: Currently TPN running 12 hours over night with thiamine  and multivitamin additions  Labs: WNL Notable signs/symptoms: none noted Any previous deficiencies? No Bowel Habits: Every day to every other day no complaints   Body Composition Scale 01/02/2024  Current Body Weight 249.6  Total Body Fat % 42.0  Visceral Fat 9  Fat-Free Mass % 57.9   Total Body Water  % 43.4  Muscle-Mass lbs 35.8  BMI 38.8  Body Fat Displacement          Torso  lbs 64.9         Left Leg  lbs 12.9         Right Leg  lbs 12.9         Left Arm  lbs 6.4         Right Arm  lbs 6.4      Lifestyle & Dietary Hx  Pt continues on TPN and PICC line.  TPN schedule: 7pm-5:40am   Pt states she wants to eat. Pt states steak is not tolerated due to it being dry and overcooked. Pt states protein shakes do not go well nor do lunch meat nor does egg.   Pt states: chicken settles heavy but no issues with pain, shrimp is fine, apple and peanut butter is fine, protein pasta went well.  Pt states she is no longer having diarrhea daily and no vomiting.   Pt states she eats about 2-4 ounces of solid food eating 1 safe food and then some other new foods stating she trys solid foods every about 2-3 hours (veggie straws, cucumber, strawberry). 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 Snack: 2 gerkins  Third Meal:  Snack:  Beverages:   Post-Op Goals/ Signs/ Symptoms Using straws: no Drinking while eating: no Chewing/swallowing difficulties: no Changes in vision: no Changes to mood/headaches: no Hair loss/changes to skin/nails: no Difficulty focusing/concentrating: no Sweating: no Limb weakness: no Dizziness/lightheadedness: no Palpitations: no  Carbonated/caffeinated beverages: no N/V/D/C/Gas: yes Abdominal pain: noyes Dumping syndrome:     NUTRITION DIAGNOSIS  Overweight/obesity ([place]-3.3) related to past poor dietary habits and physical inactivity as evidenced by completed bariatric surgery and following dietary guidelines for continued weight loss and healthy nutrition status.     NUTRITION INTERVENTION Nutrition counseling (C-1) and education (E-2) to facilitate bariatric surgery goals, including: Worked with pt on increasing the foods she is comfortable with trying within the context of meeting her goals to slowly reduce her reliance on TPN working towards coming off TPN completely  Educated pt on Physchosomatic symptoms and the role anxiety plays on GI discomfort post bariatric surgery    Learning Style & Readiness for Change Teaching method utilized: Patent attorney & Auditory  Demonstrated degree of understanding via: Teach Back  Readiness Level: Action Barriers to learning/adherence to lifestyle change: Stomach  discomfort/nausea   Plan: Start TPN at 9pm and end at 5:30am: if after 1 week of this regimen and you continue to eat solid foods without issue and have maintained weight or lost 1 pound reduce TPN by another 1-2 hours Dietitian will call for further guidance in 2 weeks    MONITORING & EVALUATION Dietary intake, weekly physical activity, body weight  Next Steps Patient is to follow-up september 30th

## 2024-02-28 ENCOUNTER — Telehealth: Payer: Self-pay | Admitting: Skilled Nursing Facility1

## 2024-02-28 NOTE — Telephone Encounter (Signed)
 Called pt to check in on her progression of PO intake.  Pt states everything is going really well and continues to eat throughout the day.  9pm to 5:30am 8.5 hours running of TPN and maintaining her weight.    Dietitian advised pt to start her TPN at 10pm instead of 9pm so now running TPN 7.5 hours which is her exact sleep and waking routine.

## 2024-03-19 ENCOUNTER — Encounter: Payer: Self-pay | Admitting: Skilled Nursing Facility1

## 2024-03-19 ENCOUNTER — Encounter: Attending: General Surgery | Admitting: Skilled Nursing Facility1

## 2024-03-19 VITALS — Wt 239.6 lb

## 2024-03-19 DIAGNOSIS — E669 Obesity, unspecified: Secondary | ICD-10-CM | POA: Diagnosis present

## 2024-03-19 NOTE — Progress Notes (Unsigned)
 Bariatric Nutrition Follow-Up Visit Medical Nutrition Therapy  Appt Start Time: 4:46   End Time: 5:15  Surgery date: 12/18/2023 Surgery type: Sleeve Gastrectomy  Anthropometrics  Start weight at [facility]: 275.2 lbs (date: 07/10/2023)  Height: 67 in Weight today: 239   Clinical   Medical hx: obesity, asthma Medications: Currently TPN running 12 hours over night with thiamine  and multivitamin additions  Labs: WNL Notable signs/symptoms: none noted Any previous deficiencies? No Bowel Habits: Every day to every other day no complaints   Body Composition Scale 01/02/2024 03/19/2024  Current Body Weight 249.6 239.6  Total Body Fat % 42.0 40.8  Visceral Fat 9 9  Fat-Free Mass % 57.9 59.1   Total Body Water  % 43.4 44  Muscle-Mass lbs 35.8 35.7  BMI 38.8 37.3  Body Fat Displacement           Torso  lbs 64.9 60.7         Left Leg  lbs 12.9 12.1         Right Leg  lbs 12.9 12.1         Left Arm  lbs 6.4 6.0         Right Arm  lbs 6.4 6.0      Lifestyle & Dietary Hx  Pt continues on TPN and PICC line.  TPN schedule: 10pm-5:30am   70% dextrose , 15% plenamine, SMOF lipid  Dietitian advised pt to start her TPN at 10pm instead of 9pm so now running TPN 7.5 hours which is her exact sleep and waking routine.   Pt states she finds her stomach will hurt if she eats too fast so has been working on eating at an appropriate rate. Pt states the first hour she stops her TPN she has an uncomfortableness in her abdominal area. 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 Pt states she is not stress eating like she used to. Pt states she has been dealing with her anxiety by sleeping.  Pharmacist started 8 hour bags stating the pharmacist calls weekly.   Estimated daily fluid intake: 40 oz Estimated daily protein intake:  g Supplements:  Current average weekly physical activity: ADL's   24-Hr Dietary Recall First Meal 6:45am: banana Snack  8:15-8:30:  protein bar Second Meal 10:20-30: salad  Snack 12:40: strawberries or cherry tomatoes  Third Meal 3: protein bar (built protein bar) Snack 7:00: chicken wrap  Beverages: water  40 ounces   Post-Op Goals/ Signs/ Symptoms Using straws: no Drinking while eating: no Chewing/swallowing difficulties: no Changes in vision: no Changes to mood/headaches: no Hair loss/changes to skin/nails: no Difficulty focusing/concentrating: no Sweating: no Limb weakness: no Dizziness/lightheadedness: no Palpitations: no  Carbonated/caffeinated beverages: no N/V/D/C/Gas: yes Abdominal pain: some  Dumping syndrome:     NUTRITION DIAGNOSIS  Overweight/obesity ([place]-3.3) related to past poor dietary habits and physical inactivity as evidenced by completed bariatric surgery and following dietary guidelines for continued weight loss and healthy nutrition status.     NUTRITION INTERVENTION Nutrition counseling (C-1) and education (E-2) to facilitate bariatric surgery goals, including: Worked with pt on increasing the foods she is comfortable with trying within the context of meeting her goals to slowly reduce her reliance on TPN working towards coming off TPN completely  Educated pt on Physchosomatic symptoms and the role anxiety plays on GI discomfort post bariatric surgery    Learning Style & Readiness for Change Teaching method utilized: Patent attorney & Auditory  Demonstrated degree  of understanding via: Teach Back  Readiness Level: Action Barriers to learning/adherence to lifestyle change: Stomach discomfort/nausea   Plan: Do every other day with TPN; dietitian to call next week to check in on progress   MONITORING & EVALUATION Dietary intake, weekly physical activity, body weight  Next Steps Patient is to follow-up september 30th

## 2024-03-26 ENCOUNTER — Encounter: Payer: Self-pay | Admitting: Skilled Nursing Facility1

## 2024-03-26 ENCOUNTER — Telehealth: Payer: Self-pay | Admitting: Skilled Nursing Facility1

## 2024-03-26 NOTE — Telephone Encounter (Signed)
 Called pt to check on progress and monitor for low blood sugars.   Pt states she lost 3 pounds since her last appt and reports no hypoglycemia occurences.    [employer]meritaBETHA Soulier making the TPN bag  Pt reporting Getting 60 grams protein per day PO

## 2024-04-03 ENCOUNTER — Encounter: Attending: General Surgery | Admitting: Skilled Nursing Facility1

## 2024-04-03 DIAGNOSIS — R112 Nausea with vomiting, unspecified: Secondary | ICD-10-CM | POA: Diagnosis present

## 2024-04-03 DIAGNOSIS — E669 Obesity, unspecified: Secondary | ICD-10-CM | POA: Insufficient documentation

## 2024-04-03 NOTE — Progress Notes (Signed)
 Bariatric Nutrition Follow-Up Visit Medical Nutrition Therapy  Appt Start Time: 4:30  End Time: 4:45  Surgery date: 12/18/2023 Surgery type: Sleeve Gastrectomy  Anthropometrics  Start weight at [facility]: 275.2 lbs (date: 07/10/2023)  Height: 67 in Weight today: virtual appt   Clinical   Medical hx: obesity, asthma Medications: Currently TPN running 12 hours over night with thiamine  and multivitamin additions  Labs: WNL Notable signs/symptoms: none noted Any previous deficiencies? No Bowel Habits: Every day to every other day no complaints   Body Composition Scale 01/02/2024 03/19/2024  Current Body Weight 249.6 239.6  Total Body Fat % 42.0 40.8  Visceral Fat 9 9  Fat-Free Mass % 57.9 59.1   Total Body Water  % 43.4 44  Muscle-Mass lbs 35.8 35.7  BMI 38.8 37.3  Body Fat Displacement           Torso  lbs 64.9 60.7         Left Leg  lbs 12.9 12.1         Right Leg  lbs 12.9 12.1         Left Arm  lbs 6.4 6.0         Right Arm  lbs 6.4 6.0      Lifestyle & Dietary Hx  Pt continues on TPN and PICC line.  TPN schedule: 10pm-5:30am   70% dextrose , 15% plenamine, SMOF lipid  Dietitian advised pt to start her TPN at 10pm instead of 9pm so now running TPN 7.5 hours which is her exact sleep and waking routine.   Pt states she finds her stomach will hurt if she eats too fast so has been working on eating at an appropriate rate. Pt states the first hour she stops her TPN she has an uncomfortableness in her abdominal area. 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 Pt states she is not stress eating like she used to. Pt states she has been dealing with her anxiety by sleeping.  Pharmacist started 8 hour bags stating the pharmacist calls weekly.   Called pt to check in on progress: pt states last night went well with the best sleep and waking hungry; pharmacist sent 4 bags instead of 7 for every other night feedings.  Pt states  she Accidentally overate realizing she needs to re learn her cues ad when to stop eating.   Pt is currently meeting 75%+ of fluid and nutrient needs.  Goal make 30 minute breaks happen at work.  55 fluid ounces +   Estimated daily fluid intake: 40 oz Estimated daily protein intake:  g Supplements:  Current average weekly physical activity: ADL's   24-Hr Dietary Recall First Meal 6:45am: banana Snack 8:15-8:30:  protein bar Second Meal 10:20-30: salad  Snack 12:40: strawberries or cherry tomatoes  Third Meal 3: protein bar (built protein bar) Snack 7:00: chicken wrap  Beverages: water  40 ounces   Post-Op Goals/ Signs/ Symptoms Using straws: no Drinking while eating: no Chewing/swallowing difficulties: no Changes in vision: no Changes to mood/headaches: no Hair loss/changes to skin/nails: no Difficulty focusing/concentrating: no Sweating: no Limb weakness: no Dizziness/lightheadedness: no Palpitations: no  Carbonated/caffeinated beverages: no N/V/D/C/Gas: yes Abdominal pain: some  Dumping syndrome:     NUTRITION DIAGNOSIS  Overweight/obesity ([place]-3.3) related to past poor dietary habits and physical inactivity as evidenced by completed bariatric surgery and following dietary guidelines for continued weight loss and healthy nutrition status.     NUTRITION INTERVENTION  Nutrition counseling (C-1) and education (E-2) to facilitate bariatric surgery goals, including: Worked with pt on increasing the foods she is comfortable with trying within the context of meeting her goals to slowly reduce her reliance on TPN working towards coming off TPN completely  Educated pt on Physchosomatic symptoms and the role anxiety plays on GI discomfort post bariatric surgery    Learning Style & Readiness for Change Teaching method utilized: Patent attorney & Auditory  Demonstrated degree of understanding via: Teach Back  Readiness Level: Action Barriers to learning/adherence to lifestyle change:  none reported   Plan: If surgeon agreeable to plan 2 TPN night in the week and stop TPN; if you do not hear from your surgeon have the pharmacist go ahead and send 2 bags speak with surgeon about possibly ending TPN  MONITORING & EVALUATION Dietary intake, weekly physical activity, body weight  Next Steps Patient is to follow-up 2 weeks

## 2024-04-14 ENCOUNTER — Other Ambulatory Visit: Payer: Self-pay | Admitting: Family Medicine

## 2024-05-05 ENCOUNTER — Telehealth: Admitting: Family

## 2024-05-05 DIAGNOSIS — J019 Acute sinusitis, unspecified: Secondary | ICD-10-CM | POA: Diagnosis not present

## 2024-05-05 MED ORDER — AMOXICILLIN-POT CLAVULANATE 875-125 MG PO TABS: 1.0000 | ORAL_TABLET | Freq: Two times a day (BID) | ORAL | 0 refills | Status: AC

## 2024-05-05 MED ORDER — FLUCONAZOLE 150 MG PO TABS: 150.0000 mg | ORAL_TABLET | ORAL | 0 refills | Status: AC | PRN

## 2024-05-05 NOTE — Addendum Note (Signed)
 Addended by: LAVELL LYE A on: 05/05/2024 10:37 AM   Modules accepted: Orders

## 2024-05-05 NOTE — Progress Notes (Signed)

## 2024-05-17 ENCOUNTER — Other Ambulatory Visit: Payer: Self-pay | Admitting: Family Medicine

## 2024-05-18 ENCOUNTER — Other Ambulatory Visit: Payer: Self-pay | Admitting: Family Medicine

## 2024-05-18 DIAGNOSIS — F411 Generalized anxiety disorder: Secondary | ICD-10-CM

## 2024-05-22 ENCOUNTER — Encounter

## 2024-05-22 ENCOUNTER — Encounter: Payer: Self-pay | Admitting: Family Medicine

## 2024-05-27 ENCOUNTER — Encounter: Admitting: Dietician

## 2024-07-14 ENCOUNTER — Other Ambulatory Visit: Payer: Self-pay | Admitting: Family Medicine

## 2024-08-05 ENCOUNTER — Encounter: Admitting: Family Medicine
